# Patient Record
Sex: Female | Born: 1955
Health system: Southern US, Community
[De-identification: ages and names within clinical notes are randomized; demographics above are authoritative.]

## PROBLEM LIST (undated history)

## (undated) DIAGNOSIS — A419 Sepsis, unspecified organism: Secondary | ICD-10-CM

## (undated) DIAGNOSIS — K219 Gastro-esophageal reflux disease without esophagitis: Secondary | ICD-10-CM

## (undated) DIAGNOSIS — M2550 Pain in unspecified joint: Secondary | ICD-10-CM

## (undated) DIAGNOSIS — M109 Gout, unspecified: Secondary | ICD-10-CM

## (undated) DIAGNOSIS — Z8719 Personal history of other diseases of the digestive system: Secondary | ICD-10-CM

## (undated) DIAGNOSIS — Z8709 Personal history of other diseases of the respiratory system: Secondary | ICD-10-CM

## (undated) DIAGNOSIS — M2540 Effusion, unspecified joint: Secondary | ICD-10-CM

## (undated) DIAGNOSIS — Z8711 Personal history of peptic ulcer disease: Secondary | ICD-10-CM

## (undated) DIAGNOSIS — Z8669 Personal history of other diseases of the nervous system and sense organs: Secondary | ICD-10-CM

## (undated) DIAGNOSIS — J189 Pneumonia, unspecified organism: Secondary | ICD-10-CM

## (undated) DIAGNOSIS — M545 Low back pain, unspecified: Secondary | ICD-10-CM

## (undated) DIAGNOSIS — E039 Hypothyroidism, unspecified: Secondary | ICD-10-CM

## (undated) DIAGNOSIS — Z973 Presence of spectacles and contact lenses: Secondary | ICD-10-CM

## (undated) DIAGNOSIS — R350 Frequency of micturition: Secondary | ICD-10-CM

## (undated) DIAGNOSIS — Z86010 Personal history of colonic polyps: Secondary | ICD-10-CM

## (undated) DIAGNOSIS — R531 Weakness: Secondary | ICD-10-CM

## (undated) DIAGNOSIS — M1990 Unspecified osteoarthritis, unspecified site: Secondary | ICD-10-CM

## (undated) DIAGNOSIS — R3915 Urgency of urination: Secondary | ICD-10-CM

## (undated) HISTORY — DX: Unspecified osteoarthritis, unspecified site: M19.90

## (undated) HISTORY — PX: ESOPHAGOGASTRODUODENOSCOPY: SHX1529

## (undated) HISTORY — PX: OTHER SURGICAL HISTORY: SHX169

---

## 1993-06-15 HISTORY — PX: TUBAL LIGATION: SHX77

## 1997-10-15 ENCOUNTER — Emergency Department (HOSPITAL_COMMUNITY): Admission: EM | Admit: 1997-10-15 | Discharge: 1997-10-15 | Payer: Self-pay | Admitting: Emergency Medicine

## 1997-11-14 ENCOUNTER — Encounter: Admission: RE | Admit: 1997-11-14 | Discharge: 1998-02-12 | Payer: Self-pay | Admitting: Family Medicine

## 1998-03-15 ENCOUNTER — Ambulatory Visit (HOSPITAL_COMMUNITY): Admission: RE | Admit: 1998-03-15 | Discharge: 1998-03-15 | Payer: Self-pay | Admitting: Cardiology

## 1998-04-03 ENCOUNTER — Emergency Department (HOSPITAL_COMMUNITY): Admission: EM | Admit: 1998-04-03 | Discharge: 1998-04-03 | Payer: Self-pay | Admitting: Emergency Medicine

## 2002-12-04 ENCOUNTER — Ambulatory Visit (HOSPITAL_COMMUNITY): Admission: RE | Admit: 2002-12-04 | Discharge: 2002-12-04 | Payer: Self-pay | Admitting: Obstetrics

## 2002-12-04 ENCOUNTER — Encounter: Payer: Self-pay | Admitting: Obstetrics

## 2004-12-18 ENCOUNTER — Ambulatory Visit (HOSPITAL_COMMUNITY): Admission: RE | Admit: 2004-12-18 | Discharge: 2004-12-18 | Payer: Self-pay | Admitting: Obstetrics

## 2005-10-14 ENCOUNTER — Encounter: Payer: Self-pay | Admitting: Cardiology

## 2006-01-21 ENCOUNTER — Ambulatory Visit (HOSPITAL_COMMUNITY): Admission: RE | Admit: 2006-01-21 | Discharge: 2006-01-21 | Payer: Self-pay | Admitting: Obstetrics

## 2006-03-15 ENCOUNTER — Encounter: Admission: RE | Admit: 2006-03-15 | Discharge: 2006-03-15 | Payer: Self-pay | Admitting: Orthopedic Surgery

## 2007-11-18 ENCOUNTER — Ambulatory Visit (HOSPITAL_COMMUNITY): Admission: RE | Admit: 2007-11-18 | Discharge: 2007-11-18 | Payer: Self-pay | Admitting: Obstetrics

## 2008-04-03 ENCOUNTER — Encounter: Admission: RE | Admit: 2008-04-03 | Discharge: 2008-04-03 | Payer: Self-pay | Admitting: Family Medicine

## 2009-11-06 ENCOUNTER — Ambulatory Visit (HOSPITAL_COMMUNITY): Admission: RE | Admit: 2009-11-06 | Discharge: 2009-11-06 | Payer: Self-pay | Admitting: Obstetrics

## 2010-07-06 ENCOUNTER — Encounter: Payer: Self-pay | Admitting: Obstetrics

## 2010-08-22 ENCOUNTER — Other Ambulatory Visit: Payer: Self-pay | Admitting: Family Medicine

## 2010-08-22 DIAGNOSIS — R140 Abdominal distension (gaseous): Secondary | ICD-10-CM

## 2010-08-28 ENCOUNTER — Other Ambulatory Visit: Payer: Self-pay

## 2010-09-05 ENCOUNTER — Other Ambulatory Visit: Payer: Self-pay

## 2010-09-08 ENCOUNTER — Ambulatory Visit
Admission: RE | Admit: 2010-09-08 | Discharge: 2010-09-08 | Disposition: A | Discharge status: Home / Self Care | Payer: 59 | Source: Ambulatory Visit | Attending: Family Medicine | Admitting: Family Medicine

## 2010-09-08 DIAGNOSIS — R140 Abdominal distension (gaseous): Secondary | ICD-10-CM

## 2010-09-08 MED ORDER — IOHEXOL 300 MG/ML  SOLN
100.0000 mL | Freq: Once | INTRAMUSCULAR | Status: AC | PRN
  Administered 2010-09-08: 100 mL via INTRAVENOUS

## 2010-10-22 ENCOUNTER — Other Ambulatory Visit: Payer: Self-pay | Admitting: Family Medicine

## 2010-10-22 ENCOUNTER — Ambulatory Visit
Admission: RE | Admit: 2010-10-22 | Discharge: 2010-10-22 | Disposition: A | Discharge status: Home / Self Care | Payer: 59 | Source: Ambulatory Visit | Attending: Family Medicine | Admitting: Family Medicine

## 2010-10-22 DIAGNOSIS — I2699 Other pulmonary embolism without acute cor pulmonale: Secondary | ICD-10-CM

## 2010-10-22 MED ORDER — IOHEXOL 300 MG/ML  SOLN
125.0000 mL | Freq: Once | INTRAMUSCULAR | Status: AC | PRN
  Administered 2010-10-22: 125 mL via INTRAVENOUS

## 2010-10-23 ENCOUNTER — Other Ambulatory Visit (HOSPITAL_COMMUNITY): Payer: Self-pay | Admitting: Family Medicine

## 2010-10-23 DIAGNOSIS — E079 Disorder of thyroid, unspecified: Secondary | ICD-10-CM

## 2010-10-30 ENCOUNTER — Encounter (HOSPITAL_COMMUNITY)
Admission: RE | Admit: 2010-10-30 | Discharge: 2010-10-30 | Disposition: A | Discharge status: Home / Self Care | Payer: Managed Care, Other (non HMO) | Source: Ambulatory Visit | Attending: Family Medicine | Admitting: Family Medicine

## 2010-10-30 ENCOUNTER — Other Ambulatory Visit (HOSPITAL_COMMUNITY): Payer: Self-pay | Admitting: Family Medicine

## 2010-10-30 DIAGNOSIS — E079 Disorder of thyroid, unspecified: Secondary | ICD-10-CM

## 2010-10-30 DIAGNOSIS — E0590 Thyrotoxicosis, unspecified without thyrotoxic crisis or storm: Secondary | ICD-10-CM

## 2010-10-31 ENCOUNTER — Ambulatory Visit (HOSPITAL_COMMUNITY): Payer: 59

## 2010-12-01 ENCOUNTER — Ambulatory Visit (HOSPITAL_COMMUNITY): Payer: 59

## 2010-12-04 ENCOUNTER — Encounter (HOSPITAL_COMMUNITY)
Admission: RE | Admit: 2010-12-04 | Discharge: 2010-12-04 | Disposition: A | Discharge status: Home / Self Care | Payer: Managed Care, Other (non HMO) | Source: Ambulatory Visit | Attending: Family Medicine | Admitting: Family Medicine

## 2010-12-04 DIAGNOSIS — E0590 Thyrotoxicosis, unspecified without thyrotoxic crisis or storm: Secondary | ICD-10-CM | POA: Insufficient documentation

## 2010-12-05 ENCOUNTER — Encounter (HOSPITAL_COMMUNITY)
Admission: RE | Admit: 2010-12-05 | Discharge: 2010-12-05 | Disposition: A | Discharge status: Home / Self Care | Payer: Managed Care, Other (non HMO) | Source: Ambulatory Visit | Attending: Family Medicine | Admitting: Family Medicine

## 2010-12-05 MED ORDER — SODIUM PERTECHNETATE TC 99M INJECTION
10.0000 | Freq: Once | INTRAVENOUS | Status: AC | PRN
  Administered 2010-12-05: 10 via INTRAVENOUS

## 2010-12-05 MED ORDER — SODIUM IODIDE I 131 CAPSULE
13.1300 | Freq: Once | INTRAVENOUS | Status: AC | PRN
  Administered 2010-12-05: 13.13 via ORAL

## 2010-12-08 ENCOUNTER — Ambulatory Visit (INDEPENDENT_AMBULATORY_CARE_PROVIDER_SITE_OTHER): Payer: 59 | Admitting: Surgery

## 2010-12-08 ENCOUNTER — Encounter (INDEPENDENT_AMBULATORY_CARE_PROVIDER_SITE_OTHER): Payer: Self-pay | Admitting: Surgery

## 2010-12-08 VITALS — BP 108/70 | HR 80 | Temp 98.3°F | Ht 63.0 in | Wt 167.0 lb

## 2010-12-08 DIAGNOSIS — E041 Nontoxic single thyroid nodule: Secondary | ICD-10-CM

## 2010-12-08 NOTE — Progress Notes (Signed)
Subjective:     Patient ID: Sandy Sutton, female   DOB: 1955/06/29, 55 y.o.   MRN: [id number]     HPI Patient is a 55 year old black female referred by Dr. Parke Simmers for evaluation of a newly diagnosed right thyroid nodule. Patient had symptoms of chest discomfort and palpitations in early May 2012. She underwent a CT angiogram at [facility]. This was a normal study with the exception of a 3.3 cm heterogeneous mass in the right thyroid lobe. Patient subsequently underwent a nuclear medicine thyroid uptake scan which showed this to be a cold nodule. Dr. Parke Simmers also checked thyroid function tests which were within normal limits. Patient is now referred to my office for evaluation of right thyroid nodule.  Patient has no prior history of thyroid disease. She has never been on thyroid medication. She has had no prior head or neck surgery. There is no significant family history of thyroid or other endocrine disease.  Patient has noted a mild globus sensation with swallowing. She denies dysphagia. She denies pain. Patient is able to palpate the nodule in the right thyroid bed.  Review of Systems Patient notes a mild globus sensation. She denies pain. She denies dysphagia. She denies any voice changes.    Objective:   Physical Exam 55 year old mildly obese black female in no acute distress HEENT: Normocephalic. Sclerae clear. Dentition good. Voice is normal. Neck: Anterior examination of the neck with the neck extended shows it to be symmetric. There is a slight fullness in the right thyroid bed. On palpation there is a dominant mass occupying a large portion of the right thyroid lobe. This is nontender. It is discrete. It is mobile with swallowing. There is no associated lymphadenopathy. Isthmus and left thyroid lobe are normal to palpation. No supraclavicular masses. No anterior or posterior cervical lymphadenopathy. Chest: There to auscultation bilaterally without rales rhonchi or  wheeze Cardiac: Regular rate and rhythm without murmur Extremities: Nontender without edema. Neurologic: No focal neurologic deficits. No sign of tremor.    Assessment:     Right thyroid nodule, 3.3 cm, cold on nuclear medicine scanning.    Plan:     Patient will be scheduled for diagnostic thyroid ultrasound at [facility] [place] [facility]. Patient will then undergo ultrasound-guided fine-needle aspiration biopsy of the right thyroid nodule. I were reviewed that the cytopathology when it is available and contact the patient. She will return to see me in this office in approximately 4 weeks for review of cytopathology results and to make a decision regarding possible surgical intervention.

## 2010-12-09 ENCOUNTER — Other Ambulatory Visit (INDEPENDENT_AMBULATORY_CARE_PROVIDER_SITE_OTHER): Payer: Self-pay | Admitting: Surgery

## 2010-12-09 DIAGNOSIS — E041 Nontoxic single thyroid nodule: Secondary | ICD-10-CM

## 2010-12-10 ENCOUNTER — Other Ambulatory Visit (HOSPITAL_COMMUNITY)
Admission: RE | Admit: 2010-12-10 | Discharge: 2010-12-10 | Disposition: A | Discharge status: Home / Self Care | Payer: Managed Care, Other (non HMO) | Source: Ambulatory Visit | Attending: Interventional Radiology | Admitting: Interventional Radiology

## 2010-12-10 ENCOUNTER — Ambulatory Visit
Admission: RE | Admit: 2010-12-10 | Discharge: 2010-12-10 | Disposition: A | Discharge status: Home / Self Care | Payer: Self-pay | Source: Ambulatory Visit | Attending: Surgery | Admitting: Surgery

## 2010-12-10 ENCOUNTER — Inpatient Hospital Stay: Admission: RE | Admit: 2010-12-10 | Payer: Managed Care, Other (non HMO) | Source: Ambulatory Visit

## 2010-12-10 ENCOUNTER — Other Ambulatory Visit: Payer: Managed Care, Other (non HMO)

## 2010-12-10 ENCOUNTER — Other Ambulatory Visit: Payer: Self-pay | Admitting: Interventional Radiology

## 2010-12-10 DIAGNOSIS — E049 Nontoxic goiter, unspecified: Secondary | ICD-10-CM | POA: Insufficient documentation

## 2010-12-10 NOTE — Progress Notes (Signed)
Addended by: Velora Heckler on: 12/10/2010 09:07 AM   Modules accepted: Orders

## 2010-12-10 NOTE — Progress Notes (Signed)
Addended by: Haze Boyden on: 12/10/2010 09:25 AM   Modules accepted: Orders

## 2011-01-19 ENCOUNTER — Ambulatory Visit (INDEPENDENT_AMBULATORY_CARE_PROVIDER_SITE_OTHER): Payer: Managed Care, Other (non HMO) | Admitting: Surgery

## 2011-01-19 ENCOUNTER — Encounter (INDEPENDENT_AMBULATORY_CARE_PROVIDER_SITE_OTHER): Payer: Self-pay | Admitting: Surgery

## 2011-01-19 VITALS — BP 116/80 | HR 64 | Temp 97.2°F

## 2011-01-19 DIAGNOSIS — E041 Nontoxic single thyroid nodule: Secondary | ICD-10-CM

## 2011-01-19 NOTE — Progress Notes (Signed)
HISTORY: The patient is a 55 year old black female who returns for followup having undergone fine needle aspiration biopsy of dominant right thyroid nodule. Cytopathology results are consistent with nonneoplastic goiter.   PERTINENT REVIEW OF SYSTEMS: Patient continues to note a mild to moderate globus sensation. She does not have significant dysphagia. She is has had no voice changes.   EXAM: HEENT: normocephalic; pupils equal and reactive; sclerae clear; dentition good; mucous membranes moist NECK:  Left thyroid lobe is without palpable abnormality. Right thyroid lobe is replaced by a dominant 3 cm nodule. This is mildly tender. It is mobile with swallowing; symmetric on extension; no palpable anterior or posterior cervical lymphadenopathy; no supraclavicular masses; no tenderness CHEST: clear to auscultation bilaterally without rales, rhonchi, or wheezes CARDIAC: regular rate and rhythm without significant murmur; peripheral pulses are full EXT:  non-tender without edema; no deformity NEURO: no gross focal deficits; no sign of tremor   IMPRESSION: Right thyroid nodule, 3.3 cm, with benign cytopathology. Mild compressive symptoms. Normal thyroid function.   PLAN: The patient and I discussed the options for further management. At this point in time I do not think she is a good candidate for thyroid hormone suppression. We discussed surgery with the option of right thyroid lobectomy. At this point the patient wishes to avoid surgery. Therefore we will plan on continued close observation. Patient will return in 6 months. We will obtain a thyroid ultrasound and a TSH level prior to that office visit.   Velora Heckler, MD, FACS General & Endocrine Surgery [facility]

## 2011-07-22 ENCOUNTER — Other Ambulatory Visit: Payer: Managed Care, Other (non HMO)

## 2012-01-25 ENCOUNTER — Other Ambulatory Visit: Payer: Self-pay | Admitting: Otolaryngology

## 2012-01-25 DIAGNOSIS — J329 Chronic sinusitis, unspecified: Secondary | ICD-10-CM

## 2012-02-19 ENCOUNTER — Inpatient Hospital Stay: Admission: RE | Admit: 2012-02-19 | Payer: Managed Care, Other (non HMO) | Source: Ambulatory Visit

## 2012-02-19 ENCOUNTER — Ambulatory Visit
Admission: RE | Admit: 2012-02-19 | Discharge: 2012-02-19 | Disposition: A | Discharge status: Home / Self Care | Payer: 59 | Source: Ambulatory Visit | Attending: Otolaryngology | Admitting: Otolaryngology

## 2012-02-19 DIAGNOSIS — J329 Chronic sinusitis, unspecified: Secondary | ICD-10-CM

## 2012-07-13 ENCOUNTER — Telehealth (INDEPENDENT_AMBULATORY_CARE_PROVIDER_SITE_OTHER): Payer: Self-pay

## 2012-07-13 ENCOUNTER — Other Ambulatory Visit (INDEPENDENT_AMBULATORY_CARE_PROVIDER_SITE_OTHER): Payer: Self-pay

## 2012-07-13 DIAGNOSIS — E042 Nontoxic multinodular goiter: Secondary | ICD-10-CM

## 2012-07-13 NOTE — Telephone Encounter (Signed)
Pt due for U/S and TSH prior to appt with Dr Gerrit Friends. LMOM for pt to call. I have put orders in epic for U/S at [facility] and labs and [facility].

## 2012-07-19 ENCOUNTER — Ambulatory Visit (INDEPENDENT_AMBULATORY_CARE_PROVIDER_SITE_OTHER): Payer: Self-pay | Admitting: Surgery

## 2012-08-06 ENCOUNTER — Other Ambulatory Visit (INDEPENDENT_AMBULATORY_CARE_PROVIDER_SITE_OTHER): Payer: Self-pay | Admitting: Surgery

## 2012-08-06 LAB — TSH: TSH: 0.961 u[IU]/mL (ref 0.350–4.500)

## 2012-08-08 ENCOUNTER — Telehealth (INDEPENDENT_AMBULATORY_CARE_PROVIDER_SITE_OTHER): Payer: Self-pay

## 2012-08-08 NOTE — Telephone Encounter (Signed)
LMOM pt has appt and is due for u/s prior to appt. Order in epic. LMOM for pt to call [phone] to set up u/s before appt.

## 2012-08-10 ENCOUNTER — Ambulatory Visit
Admission: RE | Admit: 2012-08-10 | Discharge: 2012-08-10 | Disposition: A | Discharge status: Home / Self Care | Payer: BC Managed Care – PPO | Source: Ambulatory Visit | Attending: Surgery | Admitting: Surgery

## 2012-08-10 ENCOUNTER — Telehealth (INDEPENDENT_AMBULATORY_CARE_PROVIDER_SITE_OTHER): Payer: Self-pay

## 2012-08-10 DIAGNOSIS — E042 Nontoxic multinodular goiter: Secondary | ICD-10-CM

## 2012-08-10 NOTE — Telephone Encounter (Signed)
LMOM for pt that she needs thyroid u/s prior to her 08-15-12 ov. Pt can call [phone] to set up u/s.

## 2012-08-11 ENCOUNTER — Other Ambulatory Visit: Payer: 59

## 2012-08-15 ENCOUNTER — Encounter (HOSPITAL_COMMUNITY): Payer: Self-pay | Admitting: Pharmacy Technician

## 2012-08-15 ENCOUNTER — Encounter (INDEPENDENT_AMBULATORY_CARE_PROVIDER_SITE_OTHER): Payer: Self-pay | Admitting: Surgery

## 2012-08-15 ENCOUNTER — Ambulatory Visit (INDEPENDENT_AMBULATORY_CARE_PROVIDER_SITE_OTHER): Payer: BC Managed Care – PPO | Admitting: Surgery

## 2012-08-15 VITALS — BP 110/72 | HR 88 | Temp 97.3°F | Resp 16 | Ht 63.0 in | Wt 165.0 lb

## 2012-08-15 DIAGNOSIS — E041 Nontoxic single thyroid nodule: Secondary | ICD-10-CM

## 2012-08-15 NOTE — Patient Instructions (Signed)

## 2012-08-15 NOTE — Progress Notes (Signed)
General Surgery [facility]  Chief Complaint  Patient presents with  . Thyroid Nodule    long term follow-up    HISTORY: Patient is a 57 year old black female last seen in August 2012 for a dominant right thyroid nodule. In the interim she has developed progressive compressive symptoms including dysphagia, globus sensation, and voice changes. She also notes discomfort with changes in position. She would like to discuss surgical removal.  At my request the patient underwent a followup thyroid ultrasound which demonstrates a dominant nodule in the right inferior thyroid lobe measuring 3.7 cm. This shows a slight interval increase. Left thyroid lobe contained subcentimeter nodules. TSH level is normal at 0.95.  Patient has had no prior head or neck surgery. She has never been on thyroid medication. Previous fine-needle aspiration biopsy of the dominant nodule was benign.  Past Medical History  Diagnosis Date  . Arthritis   . Headache      No current outpatient prescriptions on file.   No current facility-administered medications for this visit.     Allergies  Allergen Reactions  . Darvon Other (See Comments)    Hallucinations  . Vicodin (Hydrocodone-Acetaminophen) Nausea And Vomiting  . Erythromycin Nausea And Vomiting and Rash     Family History  Problem Relation Age of Onset  . Cancer Mother   . Alzheimer's disease Father   . Hypertension Sister   . Breast cancer Sister   . Lupus Sister   . Cancer Brother      History   Social History  . Marital Status: Divorced    Spouse Name: N/A    Number of Children: N/A  . Years of Education: N/A   Social History Main Topics  . Smoking status: Never Smoker   . Smokeless tobacco: None  . Alcohol Use: Yes     Comment: rare  . Drug Use: No  . Sexually Active: None   Other Topics Concern  . None   Social History Narrative  . None     REVIEW OF SYSTEMS - PERTINENT POSITIVES ONLY: Progressive  compressive symptoms including dysphagia, shortness of breath, voice changes, and globus sensation.  EXAM: Filed Vitals:   08/15/12 1146  BP: 110/72  Pulse: 88  Temp: 97.3 F (36.3 C)  Resp: 16    HEENT: normocephalic; pupils equal and reactive; sclerae clear; dentition good; mucous membranes moist NECK:  Palpable dominant mass occupying a large part of the right thyroid lobe measuring approximately 4 cm in size and extending beneath the right clavicular head; slight tracheal deviation to the left; asymmetric on extension; no palpable anterior or posterior cervical lymphadenopathy; no supraclavicular masses; no tenderness CHEST: clear to auscultation bilaterally without rales, rhonchi, or wheezes CARDIAC: regular rate and rhythm without significant murmur; peripheral pulses are full EXT:  non-tender without edema; no deformity NEURO: no gross focal deficits; no sign of tremor   LABORATORY RESULTS: See [facility] (CHL-Epic) for most recent results   RADIOLOGY RESULTS: See [facility] (CHL-Epic) for most recent results   IMPRESSION: Dominant right thyroid nodule, 3.7 cm, with mild to moderate compressive symptoms  PLAN: The patient and I again discussed the significance of this finding. I provided her with written literature to review. We discussed options for management been continued observation, right thyroid lobectomy, or total thyroidectomy. After discussion of the risk and benefits, the patient would like to proceed with right thyroid lobectomy. We discussed the potential for injury to recurrent laryngeal nerve and injury to parathyroid  glands. We discussed the hospital stay to be anticipated. We discussed her postoperative recovery. We discussed the potential need for thyroid hormone replacement. We discussed the potential need for further thyroid surgery in the future. She understands and wishes to proceed. We will make arrangements for her procedure in the near  future.  The risks and benefits of the procedure have been discussed at length with the patient.  The patient understands the proposed procedure, potential alternative treatments, and the course of recovery to be expected.  All of the patient's questions have been answered at this time.  The patient wishes to proceed with surgery.  Velora Heckler, MD, FACS General & Endocrine Surgery [facility]   Visit Diagnoses: 1. Thyroid nodule, cold     Primary Care Physician: Geraldo Pitter, MD

## 2012-08-16 NOTE — Patient Instructions (Signed)
20 Sandy Sutton  08/16/2012   Your procedure is scheduled on: 3-7  -2014  Report to [facility] at    1000    AM   Call this number if you have problems the morning of surgery: [phone]  Or Presurgical Testing [phone](Sandy Sutton)   Remember: Follow any bowel prep instructions per MD office. For Cpap use: Bring mask and tubing only.   Do not eat food:After Midnight.  May have clear liquids:up to 6 Hours before arrival. Nothing after :  Clear liquids include soda, tea, black coffee, apple or grape juice, broth.  Take these medicines the morning of surgery with A SIP OF WATER:    Do not wear jewelry, make-up or nail polish.  Do not wear lotions, powders, or perfumes. You may wear deodorant.  Do not shave 12 hours prior to first CHG shower(legs and under arms).(face and neck okay.)  Do not bring valuables to the hospital.  Contacts, dentures or bridgework,body piercing,  may not be worn into surgery.  Leave suitcase in the car. After surgery it may be brought to your room.  For patients admitted to the hospital, checkout time is 11:00 AM the day of discharge.   Patients discharged the day of surgery will not be allowed to drive home. Must have responsible person with you x 24 hours once discharged.  Name and phone number of your driver:   Special Instructions: CHG(Chlorhedine 4%-"Hibiclens","Betasept","Aplicare") Shower Use Special Wash: see special instructions.(avoid face and genitals)   Please read over the following fact sheets that you were given: MRSA Information, Blood Transfusion fact sheet, Incentive Spirometry Instruction.    Failure to follow these instructions may result in Cancellation of your surgery.   Patient signature_______________________________________________________

## 2012-08-17 ENCOUNTER — Encounter (HOSPITAL_COMMUNITY): Payer: Self-pay

## 2012-08-17 ENCOUNTER — Telehealth (INDEPENDENT_AMBULATORY_CARE_PROVIDER_SITE_OTHER): Payer: Self-pay

## 2012-08-17 ENCOUNTER — Encounter (HOSPITAL_COMMUNITY)
Admission: RE | Admit: 2012-08-17 | Discharge: 2012-08-17 | Disposition: A | Discharge status: Home / Self Care | Payer: BC Managed Care – PPO | Source: Ambulatory Visit | Attending: Surgery | Admitting: Surgery

## 2012-08-17 ENCOUNTER — Ambulatory Visit (HOSPITAL_COMMUNITY)
Admission: RE | Admit: 2012-08-17 | Discharge: 2012-08-17 | Disposition: A | Discharge status: Home / Self Care | Payer: BC Managed Care – PPO | Source: Ambulatory Visit | Attending: Surgery | Admitting: Surgery

## 2012-08-17 DIAGNOSIS — E041 Nontoxic single thyroid nodule: Secondary | ICD-10-CM | POA: Insufficient documentation

## 2012-08-17 DIAGNOSIS — Z01818 Encounter for other preprocedural examination: Secondary | ICD-10-CM | POA: Insufficient documentation

## 2012-08-17 DIAGNOSIS — Z01812 Encounter for preprocedural laboratory examination: Secondary | ICD-10-CM | POA: Insufficient documentation

## 2012-08-17 LAB — CBC
HCT: 35.7 % — ABNORMAL LOW (ref 36.0–46.0)
Hemoglobin: 11.6 g/dL — ABNORMAL LOW (ref 12.0–15.0)
MCH: 29.4 pg (ref 26.0–34.0)
MCHC: 32.5 g/dL (ref 30.0–36.0)
MCV: 90.4 fL (ref 78.0–100.0)
Platelets: 331 10*3/uL (ref 150–400)
RBC: 3.95 MIL/uL (ref 3.87–5.11)
RDW: 12.4 % (ref 11.5–15.5)
WBC: 9.8 10*3/uL (ref 4.0–10.5)

## 2012-08-17 LAB — BASIC METABOLIC PANEL
BUN: 15 mg/dL (ref 6–23)
CO2: 28 mEq/L (ref 19–32)
Calcium: 9.4 mg/dL (ref 8.4–10.5)
Chloride: 106 mEq/L (ref 96–112)
Creatinine, Ser: 0.85 mg/dL (ref 0.50–1.10)
GFR calc Af Amer: 86 mL/min — ABNORMAL LOW (ref >90)
GFR calc non Af Amer: 74 mL/min — ABNORMAL LOW (ref >90)
Glucose, Bld: 111 mg/dL — ABNORMAL HIGH (ref 70–99)
Potassium: 4.3 mEq/L (ref 3.5–5.1)
Sodium: 141 mEq/L (ref 135–145)

## 2012-08-17 LAB — SURGICAL PCR SCREEN
MRSA, PCR: NEGATIVE
Staphylococcus aureus: NEGATIVE

## 2012-08-17 NOTE — Progress Notes (Addendum)
CXR done today results on the chart in Epic

## 2012-08-17 NOTE — Telephone Encounter (Signed)
LMOM PO appt date.

## 2012-08-17 NOTE — Pre-Procedure Instructions (Addendum)
20 Sandy Sutton  08/17/2012   Your procedure is scheduled on: Friday, August 19, 2012   Report to [facility] at  AM.1000  Call this number if you have problems the morning of surgery: [phone]   Remember:   Do not drink liquids or  eat food:After Midnight.Thursday night 03-06, 2014      Take these medicines the morning of surgery with A SIP OF WATER: None                          SEE [facility] PREPARING FOR SURGERY SHEET    Do not wear jewelry, make-up or nail polish.  Do not wear lotions, powders, or perfumes. You may wear deodorant.             Men may shave face and neck.  Do not bring valuables to the hospital.  Contacts, dentures or bridgework may not be worn into surgery.  Leave suitcase in the car. After surgery it may be brought to your room.  For patients admitted to the hospital, checkout time is 11:00 AM the day of                         discharge.   Patients discharged the day of surgery will not be allowed to drive home.  Name and phone number of your driver: daughter Sandy Sutton 336 [phone]     Please read over the following fact sheets that you were given:MRSA Information                    Call Jolyn Nap, RN pre op nurse if needed [phone]               FAILURE TO FOLLOW THESE INSTRUCTIONS MAY RESULT IN THE CANCELLATION OF YOUR SURGERY. PATIENT SIGNATURE___________________________________________________

## 2012-08-18 NOTE — Progress Notes (Signed)
Quick Note:  These results are acceptable for scheduled surgery.  Todd M. Gerkin, MD, FACS [facility] Office: [phone]   ______ 

## 2012-08-19 ENCOUNTER — Observation Stay (HOSPITAL_COMMUNITY)
Admission: RE | Admit: 2012-08-19 | Discharge: 2012-08-21 | Disposition: A | Discharge status: Home / Self Care | Payer: BC Managed Care – PPO | Source: Ambulatory Visit | Attending: Surgery | Admitting: Surgery

## 2012-08-19 ENCOUNTER — Encounter (HOSPITAL_COMMUNITY)
Admission: RE | Disposition: A | Discharge status: Home / Self Care | Payer: Self-pay | Source: Ambulatory Visit | Attending: Surgery

## 2012-08-19 ENCOUNTER — Encounter (HOSPITAL_COMMUNITY): Payer: Self-pay | Admitting: *Deleted

## 2012-08-19 ENCOUNTER — Ambulatory Visit (HOSPITAL_COMMUNITY): Payer: BC Managed Care – PPO | Admitting: Anesthesiology

## 2012-08-19 ENCOUNTER — Encounter (HOSPITAL_COMMUNITY): Payer: Self-pay | Admitting: Anesthesiology

## 2012-08-19 DIAGNOSIS — E041 Nontoxic single thyroid nodule: Secondary | ICD-10-CM | POA: Diagnosis present

## 2012-08-19 DIAGNOSIS — E042 Nontoxic multinodular goiter: Principal | ICD-10-CM | POA: Insufficient documentation

## 2012-08-19 HISTORY — PX: THYROID LOBECTOMY: SHX420

## 2012-08-19 SURGERY — LOBECTOMY, THYROID
Anesthesia: General | Laterality: Right | Wound class: Clean

## 2012-08-19 MED ORDER — HEMOSTATIC AGENTS (NO CHARGE) OPTIME
TOPICAL | Status: DC | PRN
  Administered 2012-08-19: 1 via TOPICAL

## 2012-08-19 MED ORDER — ACETAMINOPHEN 325 MG PO TABS: 650.0000 mg | ORAL_TABLET | ORAL | Status: DC | PRN

## 2012-08-19 MED ORDER — CEFAZOLIN SODIUM-DEXTROSE 2-3 GM-% IV SOLR
2.0000 g | INTRAVENOUS | Status: AC
  Administered 2012-08-19: 2 g via INTRAVENOUS

## 2012-08-19 MED ORDER — EPHEDRINE SULFATE 50 MG/ML IJ SOLN
INTRAMUSCULAR | Status: DC | PRN
  Administered 2012-08-19: 5 mg via INTRAVENOUS

## 2012-08-19 MED ORDER — LACTATED RINGERS IV SOLN
INTRAVENOUS | Status: DC
  Administered 2012-08-19 (×2): via INTRAVENOUS

## 2012-08-19 MED ORDER — ONDANSETRON HCL 4 MG/2ML IJ SOLN
INTRAMUSCULAR | Status: DC | PRN
  Administered 2012-08-19: 4 mg via INTRAVENOUS

## 2012-08-19 MED ORDER — TRAMADOL HCL 50 MG PO TABS
50.0000 mg | ORAL_TABLET | Freq: Four times a day (QID) | ORAL | Status: DC | PRN
  Administered 2012-08-20: 50 mg via ORAL
  Filled 2012-08-19: qty 1

## 2012-08-19 MED ORDER — ACETAMINOPHEN 10 MG/ML IV SOLN
INTRAVENOUS | Status: DC | PRN
  Administered 2012-08-19: 1000 mg via INTRAVENOUS

## 2012-08-19 MED ORDER — ACETAMINOPHEN 10 MG/ML IV SOLN
INTRAVENOUS | Status: AC
  Filled 2012-08-19: qty 100

## 2012-08-19 MED ORDER — KCL IN DEXTROSE-NACL 20-5-0.45 MEQ/L-%-% IV SOLN
INTRAVENOUS | Status: DC
  Administered 2012-08-19: 16:00:00 via INTRAVENOUS
  Filled 2012-08-19 (×3): qty 1000

## 2012-08-19 MED ORDER — HYDROMORPHONE HCL PF 1 MG/ML IJ SOLN
0.2500 mg | INTRAMUSCULAR | Status: DC | PRN
  Administered 2012-08-19 (×4): 0.5 mg via INTRAVENOUS

## 2012-08-19 MED ORDER — HYDROMORPHONE HCL PF 1 MG/ML IJ SOLN
INTRAMUSCULAR | Status: AC
  Filled 2012-08-19: qty 1

## 2012-08-19 MED ORDER — LACTATED RINGERS IV SOLN: INTRAVENOUS | Status: DC

## 2012-08-19 MED ORDER — ROCURONIUM BROMIDE 100 MG/10ML IV SOLN
INTRAVENOUS | Status: DC | PRN
  Administered 2012-08-19: 40 mg via INTRAVENOUS
  Administered 2012-08-19: 10 mg via INTRAVENOUS

## 2012-08-19 MED ORDER — PROMETHAZINE HCL 25 MG/ML IJ SOLN
12.5000 mg | INTRAMUSCULAR | Status: DC | PRN
  Administered 2012-08-19: 12.5 mg via INTRAVENOUS
  Filled 2012-08-19: qty 1

## 2012-08-19 MED ORDER — 0.9 % SODIUM CHLORIDE (POUR BTL) OPTIME
TOPICAL | Status: DC | PRN
  Administered 2012-08-19: 1000 mL

## 2012-08-19 MED ORDER — GLYCOPYRROLATE 0.2 MG/ML IJ SOLN
INTRAMUSCULAR | Status: DC | PRN
  Administered 2012-08-19: .5 mg via INTRAVENOUS

## 2012-08-19 MED ORDER — MIDAZOLAM HCL 5 MG/5ML IJ SOLN
INTRAMUSCULAR | Status: DC | PRN
  Administered 2012-08-19: 2 mg via INTRAVENOUS

## 2012-08-19 MED ORDER — CEFAZOLIN SODIUM-DEXTROSE 2-3 GM-% IV SOLR
INTRAVENOUS | Status: AC
  Filled 2012-08-19: qty 50

## 2012-08-19 MED ORDER — HYDROMORPHONE HCL PF 1 MG/ML IJ SOLN
1.0000 mg | INTRAMUSCULAR | Status: DC | PRN
  Administered 2012-08-19 – 2012-08-20 (×5): 1 mg via INTRAVENOUS
  Filled 2012-08-19 (×5): qty 1

## 2012-08-19 MED ORDER — LIDOCAINE HCL (PF) 2 % IJ SOLN
INTRAMUSCULAR | Status: DC | PRN
  Administered 2012-08-19: 75 mg

## 2012-08-19 MED ORDER — DEXAMETHASONE SODIUM PHOSPHATE 10 MG/ML IJ SOLN
INTRAMUSCULAR | Status: DC | PRN
  Administered 2012-08-19: 10 mg via INTRAVENOUS

## 2012-08-19 MED ORDER — PROPOFOL 10 MG/ML IV BOLUS
INTRAVENOUS | Status: DC | PRN
  Administered 2012-08-19: 130 mg via INTRAVENOUS

## 2012-08-19 MED ORDER — FENTANYL CITRATE 0.05 MG/ML IJ SOLN
INTRAMUSCULAR | Status: DC | PRN
  Administered 2012-08-19: 50 ug via INTRAVENOUS
  Administered 2012-08-19 (×2): 100 ug via INTRAVENOUS
  Administered 2012-08-19 (×2): 50 ug via INTRAVENOUS

## 2012-08-19 MED ORDER — NEOSTIGMINE METHYLSULFATE 1 MG/ML IJ SOLN
INTRAMUSCULAR | Status: DC | PRN
  Administered 2012-08-19: 3.5 mg via INTRAVENOUS

## 2012-08-19 SURGICAL SUPPLY — 38 items
ATTRACTOMAT 16X20 MAGNETIC DRP (DRAPES) ×2 IMPLANT
BENZOIN TINCTURE PRP APPL 2/3 (GAUZE/BANDAGES/DRESSINGS) ×2 IMPLANT
BLADE HEX COATED 2.75 (ELECTRODE) ×2 IMPLANT
BLADE SURG 15 STRL LF DISP TIS (BLADE) ×1 IMPLANT
BLADE SURG 15 STRL SS (BLADE) ×1
CANISTER SUCTION 2500CC (MISCELLANEOUS) ×2 IMPLANT
CHLORAPREP W/TINT 10.5 ML (MISCELLANEOUS) ×2 IMPLANT
CLIP TI MEDIUM 6 (CLIP) ×4 IMPLANT
CLIP TI WIDE RED SMALL 6 (CLIP) ×6 IMPLANT
CLOTH BEACON ORANGE TIMEOUT ST (SAFETY) ×2 IMPLANT
DISSECTOR ROUND CHERRY 3/8 STR (MISCELLANEOUS) IMPLANT
DRAPE PED LAPAROTOMY (DRAPES) ×2 IMPLANT
DRESSING SURGICEL FIBRLLR 1X2 (HEMOSTASIS) ×1 IMPLANT
DRSG SURGICEL FIBRILLAR 1X2 (HEMOSTASIS) ×2
ELECT REM PT RETURN 9FT ADLT (ELECTROSURGICAL) ×2
ELECTRODE REM PT RTRN 9FT ADLT (ELECTROSURGICAL) ×1 IMPLANT
GAUZE SPONGE 4X4 16PLY XRAY LF (GAUZE/BANDAGES/DRESSINGS) ×2 IMPLANT
GLOVE SURG ORTHO 8.0 STRL STRW (GLOVE) ×2 IMPLANT
GOWN STRL NON-REIN LRG LVL3 (GOWN DISPOSABLE) ×2 IMPLANT
GOWN STRL REIN XL XLG (GOWN DISPOSABLE) ×4 IMPLANT
KIT BASIN OR (CUSTOM PROCEDURE TRAY) ×2 IMPLANT
NS IRRIG 1000ML POUR BTL (IV SOLUTION) ×2 IMPLANT
PACK BASIC VI WITH GOWN DISP (CUSTOM PROCEDURE TRAY) ×2 IMPLANT
PENCIL BUTTON HOLSTER BLD 10FT (ELECTRODE) ×2 IMPLANT
SHEARS HARMONIC 9CM CVD (BLADE) ×2 IMPLANT
SPONGE GAUZE 4X4 12PLY (GAUZE/BANDAGES/DRESSINGS) ×2 IMPLANT
STAPLER VISISTAT 35W (STAPLE) IMPLANT
STRIP CLOSURE SKIN 1/2X4 (GAUZE/BANDAGES/DRESSINGS) ×2 IMPLANT
SUT MNCRL AB 4-0 PS2 18 (SUTURE) ×4 IMPLANT
SUT SILK 2 0 (SUTURE)
SUT SILK 2-0 18XBRD TIE 12 (SUTURE) IMPLANT
SUT SILK 3 0 (SUTURE)
SUT SILK 3-0 18XBRD TIE 12 (SUTURE) IMPLANT
SUT VIC AB 3-0 SH 18 (SUTURE) ×4 IMPLANT
SYR BULB IRRIGATION 50ML (SYRINGE) ×2 IMPLANT
TAPE CLOTH SURG 4X10 WHT LF (GAUZE/BANDAGES/DRESSINGS) ×2 IMPLANT
TOWEL OR 17X26 10 PK STRL BLUE (TOWEL DISPOSABLE) ×2 IMPLANT
YANKAUER SUCT BULB TIP 10FT TU (MISCELLANEOUS) ×2 IMPLANT

## 2012-08-19 NOTE — Anesthesia Preprocedure Evaluation (Addendum)
Anesthesia Evaluation  Patient identified by MRN, date of birth, ID band Patient awake    Reviewed: Allergy & Precautions, H&P , NPO status , Patient's Chart, lab work & pertinent test results  Airway Mallampati: II TM Distance: >3 FB Neck ROM: full    Dental  (+) Missing and Dental Advisory Given Missing right side teeth but front ones are OK:   Pulmonary neg pulmonary ROS,  breath sounds clear to auscultation  Pulmonary exam normal       Cardiovascular Exercise Tolerance: Good negative cardio ROS  Rhythm:regular Rate:Normal     Neuro/Psych negative neurological ROS  negative psych ROS   GI/Hepatic negative GI ROS, Neg liver ROS,   Endo/Other  negative endocrine ROS  Renal/GU negative Renal ROS  negative genitourinary   Musculoskeletal   Abdominal   Peds  Hematology negative hematology ROS (+)   Anesthesia Other Findings   Reproductive/Obstetrics negative OB ROS                          Anesthesia Physical Anesthesia Plan  ASA: I  Anesthesia Plan: General   Post-op Pain Management:    Induction: Intravenous  Airway Management Planned: Oral ETT  Additional Equipment:   Intra-op Plan:   Post-operative Plan: Extubation in OR  Informed Consent: I have reviewed the patients History and Physical, chart, labs and discussed the procedure including the risks, benefits and alternatives for the proposed anesthesia with the patient or authorized representative who has indicated his/her understanding and acceptance.   Dental Advisory Given  Plan Discussed with: CRNA and Surgeon  Anesthesia Plan Comments:         Anesthesia Quick Evaluation

## 2012-08-19 NOTE — Transfer of Care (Signed)
Immediate Anesthesia Transfer of Care Note  Patient: Sandy Sutton  Procedure(s) Performed: Procedure(s): THYROID LOBECTOMY (Right)  Patient Location: PACU  Anesthesia Type:General  Level of Consciousness: awake, sedated and patient cooperative  Airway & Oxygen Therapy: Patient Spontanous Breathing and Patient connected to face mask oxygen  Post-op Assessment: Report given to PACU RN and Post -op Vital signs reviewed and stable  Post vital signs: Reviewed and stable  Complications: No apparent anesthesia complications

## 2012-08-19 NOTE — H&P (View-Only) (Signed)
General Surgery - [facility]  Chief Complaint  Patient presents with  . Thyroid Nodule    long term follow-up    HISTORY: Patient is a 57-year-old black female last seen in August 2012 for a dominant right thyroid nodule. In the interim she has developed progressive compressive symptoms including dysphagia, globus sensation, and voice changes. She also notes discomfort with changes in position. She would like to discuss surgical removal.  At my request the patient underwent a followup thyroid ultrasound which demonstrates a dominant nodule in the right inferior thyroid lobe measuring 3.7 cm. This shows a slight interval increase. Left thyroid lobe contained subcentimeter nodules. TSH level is normal at 0.95.  Patient has had no prior head or neck surgery. She has never been on thyroid medication. Previous fine-needle aspiration biopsy of the dominant nodule was benign.  Past Medical History  Diagnosis Date  . Arthritis   . Headache      No current outpatient prescriptions on file.   No current facility-administered medications for this visit.     Allergies  Allergen Reactions  . Darvon Other (See Comments)    Hallucinations  . Vicodin (Hydrocodone-Acetaminophen) Nausea And Vomiting  . Erythromycin Nausea And Vomiting and Rash     Family History  Problem Relation Age of Onset  . Cancer Mother   . Alzheimer's disease Father   . Hypertension Sister   . Breast cancer Sister   . Lupus Sister   . Cancer Brother      History   Social History  . Marital Status: Divorced    Spouse Name: N/A    Number of Children: N/A  . Years of Education: N/A   Social History Main Topics  . Smoking status: Never Smoker   . Smokeless tobacco: None  . Alcohol Use: Yes     Comment: rare  . Drug Use: No  . Sexually Active: None   Other Topics Concern  . None   Social History Narrative  . None     REVIEW OF SYSTEMS - PERTINENT POSITIVES ONLY: Progressive  compressive symptoms including dysphagia, shortness of breath, voice changes, and globus sensation.  EXAM: Filed Vitals:   08/15/12 1146  BP: 110/72  Pulse: 88  Temp: 97.3 F (36.3 C)  Resp: 16    HEENT: normocephalic; pupils equal and reactive; sclerae clear; dentition good; mucous membranes moist NECK:  Palpable dominant mass occupying a large part of the right thyroid lobe measuring approximately 4 cm in size and extending beneath the right clavicular head; slight tracheal deviation to the left; asymmetric on extension; no palpable anterior or posterior cervical lymphadenopathy; no supraclavicular masses; no tenderness CHEST: clear to auscultation bilaterally without rales, rhonchi, or wheezes CARDIAC: regular rate and rhythm without significant murmur; peripheral pulses are full EXT:  non-tender without edema; no deformity NEURO: no gross focal deficits; no sign of tremor   LABORATORY RESULTS: See [facility] (CHL-Epic) for most recent results   RADIOLOGY RESULTS: See [facility] (CHL-Epic) for most recent results   IMPRESSION: Dominant right thyroid nodule, 3.7 cm, with mild to moderate compressive symptoms  PLAN: The patient and I again discussed the significance of this finding. I provided her with written literature to review. We discussed options for management been continued observation, right thyroid lobectomy, or total thyroidectomy. After discussion of the risk and benefits, the patient would like to proceed with right thyroid lobectomy. We discussed the potential for injury to recurrent laryngeal nerve and injury to parathyroid   glands. We discussed the hospital stay to be anticipated. We discussed her postoperative recovery. We discussed the potential need for thyroid hormone replacement. We discussed the potential need for further thyroid surgery in the future. She understands and wishes to proceed. We will make arrangements for her procedure in the near  future.  The risks and benefits of the procedure have been discussed at length with the patient.  The patient understands the proposed procedure, potential alternative treatments, and the course of recovery to be expected.  All of the patient's questions have been answered at this time.  The patient wishes to proceed with surgery.  Todd M. Gerkin, MD, FACS General & Endocrine Surgery [facility]   Visit Diagnoses: 1. Thyroid nodule, cold     Primary Care Physician: BLAND,VEITA J, MD   

## 2012-08-19 NOTE — Anesthesia Postprocedure Evaluation (Signed)
  Anesthesia Post-op Note  Patient: Sandy Sutton  Procedure(s) Performed: Procedure(s) (LRB): THYROID LOBECTOMY (Right)  Patient Location: PACU  Anesthesia Type: General  Level of Consciousness: awake and alert   Airway and Oxygen Therapy: Patient Spontanous Breathing  Post-op Pain: mild  Post-op Assessment: Post-op Vital signs reviewed, Patient's Cardiovascular Status Stable, Respiratory Function Stable, Patent Airway and No signs of Nausea or vomiting  Last Vitals:  Filed Vitals:   08/19/12 1415  BP: 112/60  Pulse: 67  Temp:   Resp: 12    Post-op Vital Signs: stable   Complications: No apparent anesthesia complications

## 2012-08-19 NOTE — Interval H&P Note (Signed)
History and Physical Interval Note:  08/19/2012 11:16 AM  Sandy Sutton  has presented today for surgery, with the diagnosis of right thyroid nodule.  The various methods of treatment have been discussed with the patient and family. After consideration of risks, benefits and other options for treatment, the patient has consented to    Procedure(s): THYROID LOBECTOMY (Right) as a surgical intervention .    The patient's history has been reviewed, patient examined, no change in status, stable for surgery.  I have reviewed the patient's chart and labs.  Questions were answered to the patient's satisfaction.    Velora Heckler, MD, FACS General & Endocrine Surgery [facility] Office: [phone]   GERKIN,TODD Judie Petit

## 2012-08-19 NOTE — Brief Op Note (Signed)
08/19/2012  1:43 PM  PATIENT:  Latina Craver  57 y.o. female  PRE-OPERATIVE DIAGNOSIS:  right thyroid nodule, cold, with compressive symptoms  POST-OPERATIVE DIAGNOSIS:  same  PROCEDURE:  Procedure(s): THYROID LOBECTOMY (Right)  SURGEON:  Surgeon(s) and Role:    * Velora Heckler, MD - Primary  ANESTHESIA:   general  EBL:  Total I/O In: 1000 [I.V.:1000] Out: -   BLOOD ADMINISTERED:none  DRAINS: none   LOCAL MEDICATIONS USED:  NONE  SPECIMEN:  Excision  DISPOSITION OF SPECIMEN:  PATHOLOGY  COUNTS:  YES  TOURNIQUET:  * No tourniquets in log *  DICTATION: .Other Dictation: Dictation Number [id number]  PLAN OF CARE: Admit for overnight observation  PATIENT DISPOSITION:  PACU - hemodynamically stable.   Delay start of Pharmacological VTE agent (>24hrs) due to surgical blood loss or risk of bleeding: yes  Velora Heckler, MD, FACS General & Endocrine Surgery [facility] Office: [phone]

## 2012-08-20 LAB — BASIC METABOLIC PANEL
BUN: 11 mg/dL (ref 6–23)
CO2: 26 mEq/L (ref 19–32)
Calcium: 9 mg/dL (ref 8.4–10.5)
Chloride: 103 mEq/L (ref 96–112)
Creatinine, Ser: 0.79 mg/dL (ref 0.50–1.10)
GFR calc Af Amer: >90 mL/min (ref >90)
GFR calc non Af Amer: >90 mL/min (ref >90)
Glucose, Bld: 142 mg/dL — ABNORMAL HIGH (ref 70–99)
Potassium: 4.4 mEq/L (ref 3.5–5.1)
Sodium: 137 mEq/L (ref 135–145)

## 2012-08-20 MED ORDER — PROMETHAZINE HCL 25 MG RE SUPP: 25.0000 mg | Freq: Four times a day (QID) | RECTAL | Status: DC | PRN

## 2012-08-20 MED ORDER — OXYCODONE HCL 5 MG PO TABS
5.0000 mg | ORAL_TABLET | ORAL | Status: DC | PRN
Start: 2012-08-20 — End: 2012-09-19

## 2012-08-20 MED ORDER — SODIUM CHLORIDE 0.9 % IJ SOLN: 3.0000 mL | INTRAMUSCULAR | Status: DC | PRN

## 2012-08-20 MED ORDER — OXYCODONE HCL 5 MG PO TABS
5.0000 mg | ORAL_TABLET | ORAL | Status: DC | PRN
  Administered 2012-08-20: 10 mg via ORAL
  Administered 2012-08-20 (×2): 5 mg via ORAL
  Administered 2012-08-21 (×2): 10 mg via ORAL
  Filled 2012-08-20 (×3): qty 2
  Filled 2012-08-20 (×2): qty 1

## 2012-08-20 MED ORDER — MAGIC MOUTHWASH
15.0000 mL | Freq: Four times a day (QID) | ORAL | Status: DC | PRN
  Filled 2012-08-20: qty 15

## 2012-08-20 MED ORDER — FENTANYL CITRATE 0.05 MG/ML IJ SOLN: 25.0000 ug | INTRAMUSCULAR | Status: DC | PRN

## 2012-08-20 MED ORDER — PROMETHAZINE HCL 25 MG/ML IJ SOLN: 12.5000 mg | Freq: Four times a day (QID) | INTRAMUSCULAR | Status: DC | PRN

## 2012-08-20 MED ORDER — MAGNESIUM HYDROXIDE 400 MG/5ML PO SUSP: 30.0000 mL | Freq: Two times a day (BID) | ORAL | Status: DC | PRN

## 2012-08-20 MED ORDER — SODIUM CHLORIDE 0.9 % IJ SOLN: 3.0000 mL | Freq: Two times a day (BID) | INTRAMUSCULAR | Status: DC

## 2012-08-20 MED ORDER — LIP MEDEX EX OINT
1.0000 "application " | TOPICAL_OINTMENT | Freq: Two times a day (BID) | CUTANEOUS | Status: DC
  Administered 2012-08-20: 1 via TOPICAL

## 2012-08-20 MED ORDER — HYDROMORPHONE HCL 2 MG PO TABS: 2.0000 mg | ORAL_TABLET | ORAL | Status: DC | PRN

## 2012-08-20 MED ORDER — PROMETHAZINE HCL 12.5 MG PO TABS: 12.5000 mg | ORAL_TABLET | Freq: Four times a day (QID) | ORAL | Status: DC | PRN

## 2012-08-20 MED ORDER — ALUM & MAG HYDROXIDE-SIMETH 200-200-20 MG/5ML PO SUSP: 30.0000 mL | Freq: Four times a day (QID) | ORAL | Status: DC | PRN

## 2012-08-20 MED ORDER — DIPHENHYDRAMINE HCL 50 MG/ML IJ SOLN: 12.5000 mg | Freq: Four times a day (QID) | INTRAMUSCULAR | Status: DC | PRN

## 2012-08-20 MED ORDER — LACTATED RINGERS IV BOLUS (SEPSIS): 1000.0000 mL | Freq: Three times a day (TID) | INTRAVENOUS | Status: DC | PRN

## 2012-08-20 MED ORDER — BISACODYL 10 MG RE SUPP: 10.0000 mg | Freq: Two times a day (BID) | RECTAL | Status: DC | PRN

## 2012-08-20 MED ORDER — ACETAMINOPHEN 500 MG PO TABS
1000.0000 mg | ORAL_TABLET | Freq: Three times a day (TID) | ORAL | Status: DC
  Administered 2012-08-20 – 2012-08-21 (×4): 1000 mg via ORAL
  Filled 2012-08-20 (×9): qty 2

## 2012-08-20 MED ORDER — ACETAMINOPHEN 500 MG PO TABS: 1000.0000 mg | ORAL_TABLET | Freq: Three times a day (TID) | ORAL | Status: DC

## 2012-08-20 NOTE — Progress Notes (Signed)
Sandy Sutton [id number] 04-Aug-1955   Subjective:  Sore Tramadol not working Sleepy w Dilaudid Lightheaded walking Daughter in room  Objective:  Vital signs:  Filed Vitals:   08/19/12 1800 08/19/12 2209 08/20/12 0218 08/20/12 0618  BP: 105/70 101/67 110/69 112/70  Pulse: 65 63 65 67  Temp: 98 F (36.7 C) 97.8 F (36.6 C) 97.7 F (36.5 C) 97.8 F (36.6 C)  TempSrc: Oral Oral Oral Oral  Resp: 20 18 18 18   Height:      Weight:      SpO2: 95% 95% 95% 97%    Last BM Date: 08/19/12  Intake/Output   Yesterday:  03/07 0701 - 03/08 0700 In: 6213.0 [P.O.:720; I.V.:1914.2] Out: 1350 [Urine:1350] This shift:  Total I/O In: 422.5 [P.O.:240; I.V.:182.5] Out: 650 [Urine:650]  Bowel function:  Flatus: y  BM: n  Physical Exam:  General: Pt awake/alert/oriented x4 in no acute distress Eyes: PERRL, normal EOM.  Sclera clear.  No icterus Neuro: CN II-XII intact w/o focal sensory/motor deficits. Lymph: No head/neck/groin lymphadenopathy Psych:  No delerium/psychosis/paranoia HENT: Normocephalic, Mucus membranes moist.  No thrush.  No hoarseness Neck: Supple, No tracheal deviation.  Dressing removed.  Normal healing ridge.  No hematoma Chest: No chest wall pain w good excursion CV:  Pulses intact.  Regular rhythm MS: Normal AROM mjr joints.  No obvious deformity Abdomen: Soft.  Nondistended.  No incarcerated hernias. Ext:  SCDs BLE.  No mjr edema.  No cyanosis Skin: No petechiae / purpurae  Problem List:  Principal Problem:   Thyroid nodule, cold   Assessment  Sandy Sutton  57 y.o. female  1 Day Post-Op  Procedure(s): THYROID LOBECTOMY  Fair but getting better  Plan:  -max non-narcotic pain control -try oxycodone (prob w most narcotics) -adv diet -poss d/c later today vs tomorrow:  D/C patient from hospital when patient meets criteria:  Tolerating oral intake well Ambulating in walkways Adequate pain control without IV  medications Urinating  Having flatus   -VTE prophylaxis- SCDs, etc -mobilize as tolerated to help recovery  Ardeth Sportsman, M.D., F.A.C.S. Gastrointestinal and Minimally Invasive Surgery [facility] [address], [address] [place], [place] [address] [phone] Main / Paging [phone] Voice Mail   08/20/2012  CARE TEAM:  PCP: Geraldo Pitter, MD  Outpatient Care Team: Patient Care Team: Renaye Rakers, MD as PCP - General (Family Medicine)  Inpatient Treatment Team: Treatment Team: Attending Deryl Giroux: Velora Heckler, MD; Registered Nurse: Rometta Emery, RN   Results:   Labs: Results for orders placed during the hospital encounter of 08/19/12 (from the past 48 hour(s))  BASIC METABOLIC PANEL     Status: Abnormal   Collection Time    08/20/12  5:31 AM      Result Value Range   Sodium 137  135 - 145 mEq/L   Potassium 4.4  3.5 - 5.1 mEq/L   Chloride 103  96 - 112 mEq/L   CO2 26  19 - 32 mEq/L   Glucose, Bld 142 (*) 70 - 99 mg/dL   BUN 11  6 - 23 mg/dL   Creatinine, Ser 6.44  0.50 - 1.10 mg/dL   Calcium 9.0  8.4 - 03.4 mg/dL   GFR calc non Af Amer >90  >90 mL/min   GFR calc Af Amer >90  >90 mL/min   Comment:            The eGFR has been calculated     using  the CKD EPI equation.     This calculation has not been     validated in all clinical     situations.     eGFR's persistently     <90 mL/min signify     possible Chronic Kidney Disease.    Imaging / Studies: No results found.  Medications / Allergies: per chart  Antibiotics: Anti-infectives   Start     Dose/Rate Route Frequency Ordered Stop   08/19/12 1045  ceFAZolin (ANCEF) IVPB 2 g/50 mL premix     2 g 100 mL/hr over 30 Minutes Intravenous On call to O.R. 08/19/12 1026 08/19/12 1204

## 2012-08-20 NOTE — Progress Notes (Signed)
Dr. Maisie Fus aware via phone pt reported wanting to wait and dc home tomorrow instead of today. MD ok with that with order received to cancel dc order.

## 2012-08-20 NOTE — Op Note (Signed)
Sandy Sutton, Sandy Sutton NO.:  [id number]  MEDICAL RECORD NO.:  [id number]  LOCATION:  1538                         FACILITY:  [facility]  PHYSICIAN:  Velora Heckler, MD      DATE OF BIRTH:  [birth year]  DATE OF PROCEDURE:  08/19/2012                               OPERATIVE REPORT   PREOPERATIVE DIAGNOSIS:  Right thyroid nodule with compressive symptoms.  POSTOPERATIVE DIAGNOSIS:  Right thyroid nodule with compressive symptoms.  PROCEDURE:  Right thyroid lobectomy.  SURGEON:  Velora Heckler, MD, FACS   ANESTHESIA:  General per Dr. Ronelle Nigh.  PREPARATION:  ChloraPrep.  COMPLICATIONS:  None.  ESTIMATED BLOOD LOSS:  Minimal.  INDICATIONS:  The patient is a 57 year old, black female followed since 2012 with a dominant right thyroid nodule.  Over the past 2 years, she has developed progressive compressive symptoms including dysphagia, globus sensation, and vocal changes.  The patient has an ultrasound demonstrating a dominant nodule in the right inferior thyroid lobe measuring 3.7 cm.  The patient now comes to surgery for right thyroid lobectomy.  BODY OF REPORT:  Procedure was done in OR #6 at the [facility].  The patient was brought to the operating room, placed in the supine position on the operating room table.  Following administration of general anesthesia, the patient was positioned and then prepped and draped in the usual strict aseptic fashion.  After ascertaining that an adequate level of anesthesia been achieved, a Kocher incision was made with a #15 blade.  Dissection was carried through subcutaneous tissues.  Platysma was divided.  External jugular veins were suture ligated with 3-0 Vicryl suture ligatures.  Skin flaps were elevated cephalad and caudad from the thyroid notch to the sternal notch.  The Mahorner self-retaining retractor was placed for exposure. Strap muscles were incised on the midline, and dissection was begun  on the left side.  Left thyroid lobe was exposed.  Palpation showed it to be mildly enlarged, mildly firm, but without discrete or dominant mass. No gross abnormality was identified.  No lymphadenopathy was palpable.  Next, we turned our attention to the right side.  Strap muscles were again elevated and reflected to the right.  Right lower lobe was moderately enlarged.  There were multiple nodules with a dominant nodule in the inferior pole.  Gland was gently mobilized.  Venous tributaries were divided between medium Ligaclips with the Harmonic scalpel. Superior pole was dissected out and superior pole vessels divided individually between small and medium Ligaclips with the Harmonic scalpel.  Parathyroid tissue was identified and preserved.  Gland was rolled anteriorly.  Branches of the inferior thyroid artery were divided between small Ligaclips with the Harmonic scalpel.  Recurrent laryngeal nerve was identified and preserved along its length.  Ligament of Allyson Sabal was released with electrocautery, and the gland was mobilized onto the anterior trachea.  Inferior venous tributaries were divided between Ligaclips, and the gland was mobilized up and onto the anterior trachea. Isthmus was mobilized across the midline.  There was no significant pyramidal lobe.  Thyroid parenchyma was divided at the junction of the isthmus and left thyroid lobe with the Harmonic scalpel.  Right thyroid lobe was  submitted to Pathology for review.  The neck was irrigated with warm saline.  Good hemostasis was achieved. Fibrillar was placed throughout the operative field.  Strap muscles were reapproximated in the midline with interrupted 3-0 Vicryl sutures. Platysma was closed with interrupted 3-0 Vicryl sutures.  Skin was closed with a running 4-0 Monocryl subcuticular suture.  Wound was washed and dried, and benzoin and Steri-Strips were applied.  Sterile dressings were applied.  The patient was awakened from  anesthesia and brought to the recovery room.  The patient tolerated the procedure well.   Velora Heckler, MD, FACS General & Endocrine Surgery [facility] Office: [phone]   TMG/MEDQ  D:  08/19/2012  T:  08/20/2012  Job:  [id number]  cc:   Renaye Rakers, M.D. Fax: [phone]

## 2012-08-21 LAB — BASIC METABOLIC PANEL
BUN: 11 mg/dL (ref 6–23)
CO2: 27 mEq/L (ref 19–32)
Calcium: 8.8 mg/dL (ref 8.4–10.5)
Chloride: 103 mEq/L (ref 96–112)
Creatinine, Ser: 0.81 mg/dL (ref 0.50–1.10)
GFR calc Af Amer: >90 mL/min (ref >90)
GFR calc non Af Amer: 79 mL/min — ABNORMAL LOW (ref >90)
Glucose, Bld: 89 mg/dL (ref 70–99)
Potassium: 3.8 mEq/L (ref 3.5–5.1)
Sodium: 137 mEq/L (ref 135–145)

## 2012-08-21 NOTE — Progress Notes (Signed)
Assessment unchanged. In good spirits, ready for dc home. Denies pain. Pt verbalized understanding of dc instructions and able to verbalize to nurse when follow-up appt is due. Also able to tell nurse s/o tetany and when to call MD. Script x1 given as provided by MD. Introduced to My Chart with verbalized understanding. Discharged via wc to front entrance to meet awaiting vehicle to carry home. Accompanied by son and NT.

## 2012-08-21 NOTE — Care Management Note (Signed)
    Page 1 of 1   08/21/2012     4:16:54 PM   CARE MANAGEMENT NOTE 08/21/2012  Patient:  Sandy Sutton, Sandy Sutton   Account Number:  [id number]  Date Initiated:  08/21/2012  Documentation initiated by:  Lanier Clam  Subjective/Objective Assessment:   ADMITTED W/THYROID NODULE.     Action/Plan:   FROM HOME   Anticipated DC Date:  08/21/2012   Anticipated DC Plan:  HOME/SELF CARE      DC Planning Services  CM consult      Choice offered to / List presented to:             Status of service:  Completed, signed off Medicare Important Message given?   (If response is "NO", the following Medicare IM given date fields will be blank) Date Medicare IM given:   Date Additional Medicare IM given:    Discharge Disposition:  HOME/SELF CARE  Per UR Regulation:  Reviewed for med. necessity/level of care/duration of stay  If discussed at Long Length of Stay Meetings, dates discussed:    Comments:  08/21/12 KATHY MAHABIR RN,BSN NCM [phone] D/C HOME NO NEEDS, OR ORDERS.

## 2012-08-21 NOTE — Discharge Summary (Signed)
Physician Discharge Summary  Patient ID: Sandy Sutton MRN: [id number] DOB/AGE: Aug 17, 1955 57 y.o.  Admit date: 08/19/2012 Discharge date: 08/21/2012  Admission Diagnoses:  Discharge Diagnoses:  Principal Problem:   Thyroid nodule, cold   Discharged Condition: good  Hospital Course: Postoperatively, the patient mobilized and advanced to a solid diet gradually.  Sore & dysphagia POD#1 but better.  Pain was well-controlled and transitioned off IV medications.    By the time of discharge, the patient was walking well the hallways, eating food well, having flatus.  Pain was-controlled on an oral regimen.  Based on meeting DC criteria and recovering well, I felt it was safe for the patient to be discharged home with close followup.  Instructions were discussed in detail.  They are written as well.     Consults: None  Significant Diagnostic Studies:   Treatments: surgery: Right thyroid lobectomy   Discharge Exam: Blood pressure 101/66, pulse 81, temperature 97.8 F (36.6 C), temperature source Oral, resp. rate 18, height 5\' 3"  (1.6 m), weight 165 lb (74.844 kg), SpO2 96.00%.  General: Pt awake/alert/oriented x4 in no major acute distress Eyes: PERRL, normal EOM. Sclera nonicteric Neuro: CN II-XII intact w/o focal sensory/motor deficits. Lymph: No head/neck/groin lymphadenopathy Psych:  No delerium/psychosis/paranoia HENT: Normocephalic, Mucus membranes moist.  No thrush Neck: Supple, No tracheal deviation.  Dressing c/d/i.  Normal healing ridge Chest: No pain.  Good respiratory excursion. CV:  Pulses intact.  Regular rhythm MS: Normal AROM mjr joints.  No obvious deformity Abdomen: Soft, Nondistended.  Nontender.  No incarcerated hernias. Ext:  SCDs BLE.  No significant edema.  No cyanosis Skin: No petechiae / purpurae   Disposition: Final discharge disposition not confirmed  Discharge Orders   Future Appointments Nataniel Gasper Department Dept Phone   09/14/2012 9:30 AM Velora Heckler, MD [facility] [place] [phone]   Future Orders Complete By Expires     Diet - low sodium heart healthy  As directed     Increase activity slowly  As directed         Medication List    TAKE these medications       CENTRUM SILVER ADULT 50+ Tabs  Take 1 tablet by mouth daily.     oxyCODONE 5 MG immediate release tablet  Commonly known as:  Oxy IR/ROXICODONE  Take 1-2 tablets (5-10 mg total) by mouth every 4 (four) hours as needed.     promethazine 12.5 MG tablet  Commonly known as:  PHENERGAN  Take 1-2 tablets (12.5-25 mg total) by mouth every 6 (six) hours as needed for nausea.     promethazine 25 MG suppository  Commonly known as:  PHENERGAN  Place 1 suppository (25 mg total) rectally every 6 (six) hours as needed for nausea.           Follow-up Information   Follow up with Velora Heckler, MD. Schedule an appointment as soon as possible for a visit in 2 weeks.   Contact information:   [address] [place] [address] [phone]       Signed: Ardeth Sportsman. 08/21/2012, 10:13 AM

## 2012-08-22 ENCOUNTER — Encounter (HOSPITAL_COMMUNITY): Payer: Self-pay | Admitting: Surgery

## 2012-08-22 NOTE — Progress Notes (Signed)
Quick Note:  Please contact patient and notify of benign pathology results.  Avyay Coger M. Jordani Nunn, MD, FACS [facility] Office: [phone]   ______ 

## 2012-08-23 ENCOUNTER — Telehealth (INDEPENDENT_AMBULATORY_CARE_PROVIDER_SITE_OTHER): Payer: Self-pay

## 2012-08-23 ENCOUNTER — Encounter (INDEPENDENT_AMBULATORY_CARE_PROVIDER_SITE_OTHER): Payer: Self-pay

## 2012-08-23 NOTE — Telephone Encounter (Signed)
Pt home doing well. Path result given to pt. Pt request rtw note for 4hrs a day to start 08-29-12 and full ours 09-05-12. Not completed in epic and mailed to pt.

## 2012-08-31 ENCOUNTER — Telehealth (INDEPENDENT_AMBULATORY_CARE_PROVIDER_SITE_OTHER): Payer: Self-pay

## 2012-08-31 ENCOUNTER — Encounter (INDEPENDENT_AMBULATORY_CARE_PROVIDER_SITE_OTHER): Payer: Self-pay

## 2012-08-31 NOTE — Telephone Encounter (Signed)
Pt called stating she has changed her mind and does want to take her full 2 weeks off to recover. Pt orig rtw date was 09-05-12. Pt advised letter complete and at front desk to pick up.

## 2012-09-14 ENCOUNTER — Encounter (INDEPENDENT_AMBULATORY_CARE_PROVIDER_SITE_OTHER): Payer: BC Managed Care – PPO | Admitting: Surgery

## 2012-09-19 ENCOUNTER — Encounter (INDEPENDENT_AMBULATORY_CARE_PROVIDER_SITE_OTHER): Payer: Self-pay | Admitting: Surgery

## 2012-09-19 ENCOUNTER — Ambulatory Visit (INDEPENDENT_AMBULATORY_CARE_PROVIDER_SITE_OTHER): Payer: BC Managed Care – PPO | Admitting: Surgery

## 2012-09-19 VITALS — BP 96/64 | HR 84 | Temp 97.2°F | Resp 20 | Ht 63.0 in | Wt 163.0 lb

## 2012-09-19 DIAGNOSIS — E041 Nontoxic single thyroid nodule: Secondary | ICD-10-CM

## 2012-09-19 NOTE — Patient Instructions (Signed)
  COCOA BUTTER & VITAMIN E CREAM  (Palmer's or other brand)  Apply cocoa butter/vitamin E cream to your incision 2 - 3 times daily.  Massage cream into incision for one minute with each application.  Use sunscreen (50 SPF or higher) for first 6 months after surgery if area is exposed to sun.  You may substitute Mederma or other scar reducing creams as desired.   

## 2012-09-19 NOTE — Progress Notes (Signed)
General Surgery [facility]  Visit Diagnoses: 1. Thyroid nodule, cold     HISTORY: Patient returns for her first postoperative visit having undergone right thyroid lobectomy 4 weeks ago. Final pathology shows adenomatoid nodules with no evidence of atypia or malignancy. Postoperative course has been uneventful.  EXAM: Surgical wound is healing nicely. Mild soft tissue swelling. No sign of seroma. No sign of infection. Voice quality is normal.  IMPRESSION: Status post right thyroid lobectomy, final pathology benign  PLAN: Patient will begin applying topical creams to her incision. We will check a TSH level at this time to determine whether she will need thyroid hormone supplementation. We will contact her with the results.  Patient will return in 6 weeks for wound check and review of laboratory studies.  Velora Heckler, MD, FACS General & Endocrine Surgery [facility]

## 2012-09-29 ENCOUNTER — Telehealth (INDEPENDENT_AMBULATORY_CARE_PROVIDER_SITE_OTHER): Payer: Self-pay

## 2012-09-29 NOTE — Telephone Encounter (Signed)
Pt was to have tsh at [facility] on 09-19-12. No record at [facility] that pt went. LMOM for pt to call me. We need to know if pt did have labs and if so where. If not pt needs to go to [facility].

## 2012-10-03 ENCOUNTER — Encounter (INDEPENDENT_AMBULATORY_CARE_PROVIDER_SITE_OTHER): Payer: Self-pay

## 2012-10-03 ENCOUNTER — Telehealth (INDEPENDENT_AMBULATORY_CARE_PROVIDER_SITE_OTHER): Payer: Self-pay

## 2012-10-03 NOTE — Telephone Encounter (Signed)
TSH here to Dr Gerrit Friends to review and advise if any medication is required. Awaiting his response.

## 2012-10-05 ENCOUNTER — Encounter (INDEPENDENT_AMBULATORY_CARE_PROVIDER_SITE_OTHER): Payer: Self-pay

## 2012-11-02 ENCOUNTER — Ambulatory Visit (INDEPENDENT_AMBULATORY_CARE_PROVIDER_SITE_OTHER): Payer: BC Managed Care – PPO | Admitting: Surgery

## 2012-11-02 ENCOUNTER — Encounter (INDEPENDENT_AMBULATORY_CARE_PROVIDER_SITE_OTHER): Payer: Self-pay | Admitting: Surgery

## 2012-11-02 VITALS — BP 122/66 | HR 84 | Temp 97.7°F | Resp 16 | Ht 64.0 in | Wt 164.8 lb

## 2012-11-02 DIAGNOSIS — E041 Nontoxic single thyroid nodule: Secondary | ICD-10-CM

## 2012-11-02 NOTE — Progress Notes (Signed)
General Surgery [facility]  Visit Diagnoses: 1. Thyroid nodule, cold     HISTORY: Patient returns for final postoperative visit having undergone thyroid lobectomy for benign disease. Follow-up TSH level is normal at 1.21. Patient will not require thyroid hormone supplementation.  EXAM: Surgical wound is well-healed. No signs or symptoms of infection. Minimal soft tissue swelling. Good cosmetic result.  IMPRESSION: Status post thyroid lobectomy  PLAN: Patient will continue to apply topical creams and massage the scar. She does not require thyroid hormone supplementation. She will see her primary care physician for regular follow-up.  Patient will return for surgical care as needed.  Velora Heckler, MD, FACS General & Endocrine Surgery [facility]

## 2012-11-02 NOTE — Patient Instructions (Signed)
  COCOA BUTTER & VITAMIN E CREAM  (Palmer's or other brand)  Apply cocoa butter/vitamin E cream to your incision 2 - 3 times daily.  Massage cream into incision for one minute with each application.  Use sunscreen (50 SPF or higher) for first 6 months after surgery if area is exposed to sun.  You may substitute Mederma or other scar reducing creams as desired.      Thyroid-Stimulating Hormone The amount of thyroid-stimulating hormone (TSH) or thyrotropin can be measured from a sample of blood. The TSH level can help diagnose thyroid gland or pituitary gland disorders, or monitor treatment of hypothyroidism and hyperthyroidism. TSH is produced by the pituitary gland, a tiny organ located below the brain. The pituitary gland is part of the body's feedback system to maintain stable levels of thyroid hormones released into the blood. Thyroid hormones help control the rate at which the body uses energy. The pituitary gland monitors the level of thyroid hormones released by the thyroid gland. The thyroid gland is a small butterfly-shaped gland that lies flat against the windpipe. If the thyroid gland does not release enough thyroid hormones, the pituitary gland detects the reduced thyroid hormone levels. The pituitary gland then makes more TSH to trigger the thyroid gland to produce more thyroid hormones. This increase in TSH is an effort to return the low thyroid hormones to normal levels. The increased TSH level is caused by the low thyroid hormone levels of an underactive thyroid (hypothyroidism). Symptoms of hypothyroidism include menstrual irregularities in women, weight gain, dry skin, constipation, cold intolerance, and fatigue. Rarely, a high TSH level can indicate a problem with the pituitary gland. A high TSH level could also occur when there is an insufficient level of thyroid hormone medication in individuals receiving that medication. If the thyroid gland releases too much thyroid hormones,  the pituitary gland detects the increased thyroid hormone levels. The pituitary gland then makes less TSH to slow the thyroid gland from producing thyroid hormones. This decrease in TSH is an effort to return the increased thyroid hormones to normal levels. The decreased TSH level is caused by the excess thyroid hormone levels of an overactive thyroid gland (hyperthyroidism). Symptoms associated with hyperthyroidism include rapid heart rate, weight loss, nervousness, hand tremors, irritated eyes, and difficulty sleeping. Rarely, a low TSH level can indicate a problem with the pituitary gland. PREPARATION FOR TEST No specific preparation is required for this blood test. A blood sample is obtained from a needle placed in a vein in your arm or from pricking the heel of an infant.  NORMAL FINDINGS  Adult: 0.5-5 milli-international Units/L (0.5-5 mIU/L)  Newborn: 3-20 milli-international Units/L (3-20 mIU/L)  Cord: 3-12 milli-international Units/L (3-12 mIU/L) Ranges for normal findings may vary among different laboratories and hospitals. You should always check with your doctor after having lab work or other tests done to discuss the meaning of your test results and whether your values are considered within normal limits. MEANING OF TEST  Your caregiver will go over the test results with you and discuss the importance and meaning of your results, as well as treatment options and the need for additional tests if necessary. OBTAINING THE TEST RESULTS It is your responsibility to obtain your test results. Ask the lab or department performing the test when and how you will get your results. Document Released: 06/26/2004 Document Revised: 08/24/2011 Document Reviewed: 05/13/2008 [facility] Patient Information 2014 [place], [place].

## 2012-11-09 ENCOUNTER — Other Ambulatory Visit (HOSPITAL_COMMUNITY): Payer: Self-pay | Admitting: Obstetrics

## 2012-11-09 DIAGNOSIS — Z1231 Encounter for screening mammogram for malignant neoplasm of breast: Secondary | ICD-10-CM

## 2012-11-10 ENCOUNTER — Ambulatory Visit (HOSPITAL_COMMUNITY)
Admission: RE | Admit: 2012-11-10 | Discharge: 2012-11-10 | Disposition: A | Discharge status: Home / Self Care | Payer: BC Managed Care – PPO | Source: Ambulatory Visit | Attending: Obstetrics | Admitting: Obstetrics

## 2012-11-10 DIAGNOSIS — Z1231 Encounter for screening mammogram for malignant neoplasm of breast: Secondary | ICD-10-CM | POA: Insufficient documentation

## 2012-11-11 ENCOUNTER — Other Ambulatory Visit: Payer: Self-pay | Admitting: Orthopedic Surgery

## 2012-11-11 ENCOUNTER — Ambulatory Visit
Admission: RE | Admit: 2012-11-11 | Discharge: 2012-11-11 | Disposition: A | Discharge status: Home / Self Care | Payer: BC Managed Care – PPO | Source: Ambulatory Visit | Attending: Orthopedic Surgery | Admitting: Orthopedic Surgery

## 2012-11-11 DIAGNOSIS — IMO0001 Reserved for inherently not codable concepts without codable children: Secondary | ICD-10-CM

## 2012-11-11 DIAGNOSIS — T1490XA Injury, unspecified, initial encounter: Secondary | ICD-10-CM

## 2013-02-15 ENCOUNTER — Other Ambulatory Visit: Payer: Self-pay | Admitting: Orthopedic Surgery

## 2013-02-15 ENCOUNTER — Ambulatory Visit
Admission: RE | Admit: 2013-02-15 | Discharge: 2013-02-15 | Disposition: A | Discharge status: Home / Self Care | Payer: BC Managed Care – PPO | Source: Ambulatory Visit | Attending: Orthopedic Surgery | Admitting: Orthopedic Surgery

## 2013-02-15 DIAGNOSIS — M542 Cervicalgia: Secondary | ICD-10-CM

## 2013-03-29 ENCOUNTER — Other Ambulatory Visit: Payer: Self-pay | Admitting: Orthopedic Surgery

## 2013-03-29 DIAGNOSIS — M542 Cervicalgia: Secondary | ICD-10-CM

## 2013-04-06 ENCOUNTER — Other Ambulatory Visit: Payer: BC Managed Care – PPO

## 2013-04-06 ENCOUNTER — Ambulatory Visit
Admission: RE | Admit: 2013-04-06 | Discharge: 2013-04-06 | Disposition: A | Discharge status: Home / Self Care | Payer: BC Managed Care – PPO | Source: Ambulatory Visit | Attending: Orthopedic Surgery | Admitting: Orthopedic Surgery

## 2013-04-06 DIAGNOSIS — M542 Cervicalgia: Secondary | ICD-10-CM

## 2013-06-15 DIAGNOSIS — Z8709 Personal history of other diseases of the respiratory system: Secondary | ICD-10-CM

## 2013-06-15 HISTORY — PX: KNEE ARTHROSCOPY: SUR90

## 2013-06-15 HISTORY — DX: Personal history of other diseases of the respiratory system: Z87.09

## 2014-01-18 ENCOUNTER — Ambulatory Visit
Admission: RE | Admit: 2014-01-18 | Discharge: 2014-01-18 | Disposition: A | Discharge status: Home / Self Care | Payer: BC Managed Care – PPO | Source: Ambulatory Visit | Attending: Orthopedic Surgery | Admitting: Orthopedic Surgery

## 2014-01-18 ENCOUNTER — Other Ambulatory Visit: Payer: Self-pay | Admitting: Orthopedic Surgery

## 2014-01-18 DIAGNOSIS — M5432 Sciatica, left side: Secondary | ICD-10-CM

## 2014-01-18 DIAGNOSIS — M2392 Unspecified internal derangement of left knee: Secondary | ICD-10-CM

## 2014-02-14 ENCOUNTER — Other Ambulatory Visit: Payer: Self-pay | Admitting: Orthopedic Surgery

## 2014-02-14 DIAGNOSIS — M25562 Pain in left knee: Secondary | ICD-10-CM

## 2014-02-20 ENCOUNTER — Ambulatory Visit
Admission: RE | Admit: 2014-02-20 | Discharge: 2014-02-20 | Disposition: A | Discharge status: Home / Self Care | Payer: BC Managed Care – PPO | Source: Ambulatory Visit | Attending: Orthopedic Surgery | Admitting: Orthopedic Surgery

## 2014-02-20 DIAGNOSIS — M25562 Pain in left knee: Secondary | ICD-10-CM

## 2014-06-15 HISTORY — PX: OTHER SURGICAL HISTORY: SHX169

## 2015-02-19 ENCOUNTER — Other Ambulatory Visit (HOSPITAL_COMMUNITY): Payer: Self-pay | Admitting: Orthopaedic Surgery

## 2015-03-01 ENCOUNTER — Inpatient Hospital Stay (HOSPITAL_COMMUNITY): Admission: RE | Admit: 2015-03-01 | Payer: No Typology Code available for payment source | Source: Ambulatory Visit

## 2015-03-06 ENCOUNTER — Encounter (HOSPITAL_COMMUNITY): Payer: Self-pay

## 2015-03-06 ENCOUNTER — Ambulatory Visit (HOSPITAL_COMMUNITY)
Admission: RE | Admit: 2015-03-06 | Discharge: 2015-03-06 | Disposition: A | Discharge status: Home / Self Care | Payer: BLUE CROSS/BLUE SHIELD | Source: Ambulatory Visit | Attending: Orthopaedic Surgery | Admitting: Orthopaedic Surgery

## 2015-03-06 ENCOUNTER — Encounter (HOSPITAL_COMMUNITY)
Admission: RE | Admit: 2015-03-06 | Discharge: 2015-03-06 | Disposition: A | Discharge status: Home / Self Care | Payer: BLUE CROSS/BLUE SHIELD | Source: Ambulatory Visit | Attending: Orthopaedic Surgery | Admitting: Orthopaedic Surgery

## 2015-03-06 DIAGNOSIS — Z01818 Encounter for other preprocedural examination: Secondary | ICD-10-CM | POA: Insufficient documentation

## 2015-03-06 DIAGNOSIS — Z01812 Encounter for preprocedural laboratory examination: Secondary | ICD-10-CM | POA: Diagnosis not present

## 2015-03-06 DIAGNOSIS — M179 Osteoarthritis of knee, unspecified: Secondary | ICD-10-CM | POA: Insufficient documentation

## 2015-03-06 DIAGNOSIS — M1710 Unilateral primary osteoarthritis, unspecified knee: Secondary | ICD-10-CM

## 2015-03-06 HISTORY — DX: Weakness: R53.1

## 2015-03-06 HISTORY — DX: Pneumonia, unspecified organism: J18.9

## 2015-03-06 HISTORY — DX: Gout, unspecified: M10.9

## 2015-03-06 HISTORY — DX: Gastro-esophageal reflux disease without esophagitis: K21.9

## 2015-03-06 HISTORY — DX: Pain in unspecified joint: M25.50

## 2015-03-06 HISTORY — DX: Personal history of colonic polyps: Z86.010

## 2015-03-06 HISTORY — DX: Frequency of micturition: R35.0

## 2015-03-06 HISTORY — DX: Personal history of other diseases of the nervous system and sense organs: Z86.69

## 2015-03-06 HISTORY — DX: Personal history of peptic ulcer disease: Z87.11

## 2015-03-06 HISTORY — DX: Personal history of other diseases of the digestive system: Z87.19

## 2015-03-06 HISTORY — DX: Urgency of urination: R39.15

## 2015-03-06 HISTORY — DX: Personal history of other diseases of the respiratory system: Z87.09

## 2015-03-06 HISTORY — DX: Effusion, unspecified joint: M25.40

## 2015-03-06 LAB — CBC
HCT: 36.8 % (ref 36.0–46.0)
Hemoglobin: 12 g/dL (ref 12.0–15.0)
MCH: 29.7 pg (ref 26.0–34.0)
MCHC: 32.6 g/dL (ref 30.0–36.0)
MCV: 91.1 fL (ref 78.0–100.0)
Platelets: 275 10*3/uL (ref 150–400)
RBC: 4.04 MIL/uL (ref 3.87–5.11)
RDW: 13 % (ref 11.5–15.5)
WBC: 7.6 10*3/uL (ref 4.0–10.5)

## 2015-03-06 LAB — URINALYSIS, ROUTINE W REFLEX MICROSCOPIC
Bilirubin Urine: NEGATIVE
Glucose, UA: NEGATIVE mg/dL
Hgb urine dipstick: NEGATIVE
Ketones, ur: NEGATIVE mg/dL
Leukocytes, UA: NEGATIVE
Nitrite: NEGATIVE
Protein, ur: NEGATIVE mg/dL
Specific Gravity, Urine: 1.02 (ref 1.005–1.030)
Urobilinogen, UA: 1 mg/dL (ref 0.0–1.0)
pH: 6.5 (ref 5.0–8.0)

## 2015-03-06 LAB — COMPREHENSIVE METABOLIC PANEL
ALT: 22 U/L (ref 14–54)
AST: 29 U/L (ref 15–41)
Albumin: 3.6 g/dL (ref 3.5–5.0)
Alkaline Phosphatase: 93 U/L (ref 38–126)
Anion gap: 7 (ref 5–15)
BUN: 10 mg/dL (ref 6–20)
CO2: 26 mmol/L (ref 22–32)
Calcium: 9.3 mg/dL (ref 8.9–10.3)
Chloride: 105 mmol/L (ref 101–111)
Creatinine, Ser: 1.05 mg/dL — ABNORMAL HIGH (ref 0.44–1.00)
GFR calc Af Amer: >60 mL/min (ref >60)
GFR calc non Af Amer: 57 mL/min — ABNORMAL LOW (ref >60)
Glucose, Bld: 93 mg/dL (ref 65–99)
Potassium: 4.1 mmol/L (ref 3.5–5.1)
Sodium: 138 mmol/L (ref 135–145)
Total Bilirubin: 0.6 mg/dL (ref 0.3–1.2)
Total Protein: 7.2 g/dL (ref 6.5–8.1)

## 2015-03-06 LAB — SURGICAL PCR SCREEN
MRSA, PCR: NEGATIVE
Staphylococcus aureus: NEGATIVE

## 2015-03-06 LAB — PROTIME-INR
INR: 1.04 (ref 0.00–1.49)
Prothrombin Time: 13.8 seconds (ref 11.6–15.2)

## 2015-03-06 MED ORDER — CHLORHEXIDINE GLUCONATE 4 % EX LIQD: 60.0000 mL | Freq: Once | CUTANEOUS | Status: DC

## 2015-03-06 NOTE — Progress Notes (Signed)
Medical Md is Dr.Bland   Denies ever having a heart cath  Echo per pt around 2005  Stress test in epic from 2012  Denies EKG or CXR in past yr

## 2015-03-06 NOTE — Progress Notes (Signed)
Medical Md  Cardiologist  Echo  Stress test  Heart cath  EKG  CXR

## 2015-03-06 NOTE — Pre-Procedure Instructions (Signed)
Sandy Sutton  03/06/2015      [id number] Lorina Rabon, [facility] [place] [address] Phone: [phone] Fax: [phone] [address] [place], [place] - [address] [place] [address] Phone: [phone] Fax: [phone]    Your procedure is scheduled on Wed, Sept 28 @ 12:30 PM  Report to [facility] Admitting at 10:30 AM  Call this number if you have problems the morning of surgery:  [phone]   Remember:  Do not eat food or drink liquids after midnight.                No Goody's,BC's,Aleve,Aspirin,Ibuprofen,Fish Oil,or any Herbal Medications.    Do not wear jewelry, make-up or nail polish.  Do not wear lotions, powders, or perfumes.  You may wear deodorant.  Do not shave 48 hours prior to surgery.    Do not bring valuables to the hospital.  [facility] [place] is not responsible for any belongings or valuables.  Contacts, dentures or bridgework may not be worn into surgery.  Leave your suitcase in the car.  After surgery it may be brought to your room.  For patients admitted to the hospital, discharge time will be determined by your treatment team.  Patients discharged the day of surgery will not be allowed to drive home.    Special instructions:  [facility] - Preparing for Surgery  Before surgery, you can play an important role.  Because skin is not sterile, your skin needs to be as free of germs as possible.  You can reduce the number of germs on you skin by washing with CHG (chlorahexidine gluconate) soap before surgery.  CHG is an antiseptic cleaner which kills germs and bonds with the skin to continue killing germs even after washing.  Please DO NOT use if you have an allergy to CHG or antibacterial soaps.  If your skin becomes reddened/irritated stop using the CHG and inform your nurse when you arrive at Short Stay.  Do not shave (including legs and  underarms) for at least 48 hours prior to the first CHG shower.  You may shave your face.  Please follow these instructions carefully:   1.  Shower with CHG Soap the night before surgery and the                                morning of Surgery.  2.  If you choose to wash your hair, wash your hair first as usual with your       normal shampoo.  3.  After you shampoo, rinse your hair and body thoroughly to remove the                      Shampoo.  4.  Use CHG as you would any other liquid soap.  You can apply chg directly       to the skin and wash gently with scrungie or a clean washcloth.  5.  Apply the CHG Soap to your body ONLY FROM THE NECK DOWN.        Do not use on open wounds or open sores.  Avoid contact with your eyes,       ears, mouth and genitals (private parts).  Wash genitals (private parts)       with your normal soap.  6.  Wash thoroughly,  paying special attention to the area where your surgery        will be performed.  7.  Thoroughly rinse your body with warm water from the neck down.  8.  DO NOT shower/wash with your normal soap after using and rinsing off       the CHG Soap.  9.  Pat yourself dry with a clean towel.            10.  Wear clean pajamas.            11.  Place clean sheets on your bed the night of your first shower and do not        sleep with pets.  Day of Surgery  Do not apply any lotions/deoderants the morning of surgery.  Please wear clean clothes to the hospital/surgery center.    Please read over the following fact sheets that you were given. Pain Booklet, Coughing and Deep Breathing, MRSA Information and Surgical Site Infection Prevention

## 2015-03-12 MED ORDER — CEFAZOLIN SODIUM-DEXTROSE 2-3 GM-% IV SOLR
2.0000 g | INTRAVENOUS | Status: AC
  Administered 2015-03-13: 2 g via INTRAVENOUS
  Filled 2015-03-12: qty 50

## 2015-03-13 ENCOUNTER — Inpatient Hospital Stay (HOSPITAL_COMMUNITY)
Admission: RE | Admit: 2015-03-13 | Discharge: 2015-03-16 | DRG: 470 | Disposition: A | Discharge status: Home Health | Payer: BLUE CROSS/BLUE SHIELD | Source: Ambulatory Visit | Attending: Orthopaedic Surgery | Admitting: Orthopaedic Surgery

## 2015-03-13 ENCOUNTER — Encounter (HOSPITAL_COMMUNITY): Payer: Self-pay

## 2015-03-13 ENCOUNTER — Encounter (HOSPITAL_COMMUNITY)
Admission: RE | Disposition: A | Discharge status: Home Health | Payer: Self-pay | Source: Ambulatory Visit | Attending: Orthopaedic Surgery

## 2015-03-13 ENCOUNTER — Inpatient Hospital Stay (HOSPITAL_COMMUNITY): Payer: BLUE CROSS/BLUE SHIELD | Admitting: Anesthesiology

## 2015-03-13 DIAGNOSIS — Z888 Allergy status to other drugs, medicaments and biological substances status: Secondary | ICD-10-CM

## 2015-03-13 DIAGNOSIS — K219 Gastro-esophageal reflux disease without esophagitis: Secondary | ICD-10-CM | POA: Diagnosis present

## 2015-03-13 DIAGNOSIS — Z8249 Family history of ischemic heart disease and other diseases of the circulatory system: Secondary | ICD-10-CM | POA: Diagnosis not present

## 2015-03-13 DIAGNOSIS — Z7982 Long term (current) use of aspirin: Secondary | ICD-10-CM | POA: Diagnosis not present

## 2015-03-13 DIAGNOSIS — Z881 Allergy status to other antibiotic agents status: Secondary | ICD-10-CM | POA: Diagnosis not present

## 2015-03-13 DIAGNOSIS — Z820 Family history of epilepsy and other diseases of the nervous system: Secondary | ICD-10-CM | POA: Diagnosis not present

## 2015-03-13 DIAGNOSIS — M1712 Unilateral primary osteoarthritis, left knee: Principal | ICD-10-CM

## 2015-03-13 DIAGNOSIS — Z885 Allergy status to narcotic agent status: Secondary | ICD-10-CM | POA: Diagnosis not present

## 2015-03-13 DIAGNOSIS — M25562 Pain in left knee: Secondary | ICD-10-CM | POA: Diagnosis present

## 2015-03-13 HISTORY — PX: KNEE ARTHROPLASTY: SHX992

## 2015-03-13 SURGERY — ARTHROPLASTY, KNEE, TOTAL, USING IMAGELESS COMPUTER-ASSISTED NAVIGATION
Anesthesia: Regional | Site: Knee | Laterality: Left

## 2015-03-13 MED ORDER — MIDAZOLAM HCL 2 MG/2ML IJ SOLN
INTRAMUSCULAR | Status: AC
  Filled 2015-03-13: qty 4

## 2015-03-13 MED ORDER — LACTATED RINGERS IV SOLN
INTRAVENOUS | Status: DC
  Administered 2015-03-13: 12:00:00 via INTRAVENOUS

## 2015-03-13 MED ORDER — METOCLOPRAMIDE HCL 5 MG/ML IJ SOLN: 5.0000 mg | Freq: Three times a day (TID) | INTRAMUSCULAR | Status: DC | PRN

## 2015-03-13 MED ORDER — ASPIRIN 81 MG PO CHEW
81.0000 mg | CHEWABLE_TABLET | Freq: Every day | ORAL | Status: DC
  Administered 2015-03-14: 81 mg via ORAL
  Filled 2015-03-13 (×2): qty 1

## 2015-03-13 MED ORDER — PROPOFOL 10 MG/ML IV BOLUS
INTRAVENOUS | Status: AC
  Filled 2015-03-13: qty 20

## 2015-03-13 MED ORDER — FENTANYL CITRATE (PF) 100 MCG/2ML IJ SOLN
INTRAMUSCULAR | Status: DC | PRN
  Administered 2015-03-13: 50 ug via INTRAVENOUS
  Administered 2015-03-13: 100 ug via INTRAVENOUS
  Administered 2015-03-13: 50 ug via INTRAVENOUS

## 2015-03-13 MED ORDER — MENTHOL 3 MG MT LOZG: 1.0000 | LOZENGE | OROMUCOSAL | Status: DC | PRN

## 2015-03-13 MED ORDER — FENTANYL CITRATE (PF) 250 MCG/5ML IJ SOLN
INTRAMUSCULAR | Status: AC
  Filled 2015-03-13: qty 5

## 2015-03-13 MED ORDER — PROPOFOL 500 MG/50ML IV EMUL
INTRAVENOUS | Status: DC | PRN
  Administered 2015-03-13: 75 ug/kg/min via INTRAVENOUS

## 2015-03-13 MED ORDER — SODIUM CHLORIDE 0.9 % IR SOLN
Status: DC | PRN
  Administered 2015-03-13: 1000 mL

## 2015-03-13 MED ORDER — ONDANSETRON HCL 4 MG/2ML IJ SOLN
INTRAMUSCULAR | Status: AC
  Filled 2015-03-13: qty 2

## 2015-03-13 MED ORDER — ACETAMINOPHEN 650 MG RE SUPP: 650.0000 mg | Freq: Four times a day (QID) | RECTAL | Status: DC | PRN

## 2015-03-13 MED ORDER — BUPIVACAINE HCL (PF) 0.25 % IJ SOLN
INTRAMUSCULAR | Status: DC | PRN
  Administered 2015-03-13: 30 mL

## 2015-03-13 MED ORDER — METHOCARBAMOL 1000 MG/10ML IJ SOLN
500.0000 mg | Freq: Four times a day (QID) | INTRAVENOUS | Status: DC | PRN
  Filled 2015-03-13: qty 5

## 2015-03-13 MED ORDER — BUPIVACAINE LIPOSOME 1.3 % IJ SUSP
20.0000 mL | INTRAMUSCULAR | Status: AC
  Administered 2015-03-13: 20 mL
  Filled 2015-03-13: qty 20

## 2015-03-13 MED ORDER — ASPIRIN EC 325 MG PO TBEC
325.0000 mg | DELAYED_RELEASE_TABLET | Freq: Every day | ORAL | Status: DC
  Administered 2015-03-14 – 2015-03-16 (×3): 325 mg via ORAL
  Filled 2015-03-13 (×3): qty 1

## 2015-03-13 MED ORDER — POLYETHYLENE GLYCOL 3350 17 G PO PACK: 17.0000 g | PACK | Freq: Every day | ORAL | Status: DC | PRN

## 2015-03-13 MED ORDER — MIDAZOLAM HCL 5 MG/5ML IJ SOLN
INTRAMUSCULAR | Status: DC | PRN
  Administered 2015-03-13: 2 mg via INTRAVENOUS

## 2015-03-13 MED ORDER — METHOCARBAMOL 500 MG PO TABS
500.0000 mg | ORAL_TABLET | Freq: Four times a day (QID) | ORAL | Status: DC | PRN
  Administered 2015-03-13 – 2015-03-15 (×6): 500 mg via ORAL
  Filled 2015-03-13 (×7): qty 1

## 2015-03-13 MED ORDER — ONDANSETRON HCL 4 MG PO TABS: 4.0000 mg | ORAL_TABLET | Freq: Four times a day (QID) | ORAL | Status: DC | PRN

## 2015-03-13 MED ORDER — OXYCODONE HCL 5 MG PO TABS
5.0000 mg | ORAL_TABLET | ORAL | Status: DC | PRN
  Administered 2015-03-13 (×2): 5 mg via ORAL
  Administered 2015-03-13 – 2015-03-16 (×16): 10 mg via ORAL
  Filled 2015-03-13 (×10): qty 2
  Filled 2015-03-13: qty 1
  Filled 2015-03-13 (×2): qty 2
  Filled 2015-03-13: qty 1
  Filled 2015-03-13 (×4): qty 2

## 2015-03-13 MED ORDER — LACTATED RINGERS IV SOLN
INTRAVENOUS | Status: DC | PRN
  Administered 2015-03-13 (×2): via INTRAVENOUS

## 2015-03-13 MED ORDER — ONDANSETRON HCL 4 MG/2ML IJ SOLN: 4.0000 mg | Freq: Four times a day (QID) | INTRAMUSCULAR | Status: DC | PRN

## 2015-03-13 MED ORDER — ALUM & MAG HYDROXIDE-SIMETH 200-200-20 MG/5ML PO SUSP: 30.0000 mL | ORAL | Status: DC | PRN

## 2015-03-13 MED ORDER — FENTANYL CITRATE (PF) 100 MCG/2ML IJ SOLN
INTRAMUSCULAR | Status: AC
  Filled 2015-03-13: qty 2

## 2015-03-13 MED ORDER — PHENYLEPHRINE HCL 10 MG/ML IJ SOLN
10.0000 mg | INTRAVENOUS | Status: DC | PRN
  Administered 2015-03-13: 20 ug/min via INTRAVENOUS

## 2015-03-13 MED ORDER — SODIUM CHLORIDE 0.9 % IV SOLN
INTRAVENOUS | Status: DC
Start: 2015-03-13 — End: 2015-03-16
  Administered 2015-03-13: via INTRAVENOUS

## 2015-03-13 MED ORDER — BUPIVACAINE-EPINEPHRINE (PF) 0.5% -1:200000 IJ SOLN
INTRAMUSCULAR | Status: DC | PRN
  Administered 2015-03-13: 30 mL via PERINEURAL

## 2015-03-13 MED ORDER — 0.9 % SODIUM CHLORIDE (POUR BTL) OPTIME
TOPICAL | Status: DC | PRN
  Administered 2015-03-13: 1000 mL

## 2015-03-13 MED ORDER — FLEET ENEMA 7-19 GM/118ML RE ENEM: 1.0000 | ENEMA | Freq: Once | RECTAL | Status: DC | PRN

## 2015-03-13 MED ORDER — ONDANSETRON HCL 4 MG/2ML IJ SOLN
INTRAMUSCULAR | Status: DC | PRN
  Administered 2015-03-13: 4 mg via INTRAVENOUS

## 2015-03-13 MED ORDER — DIPHENHYDRAMINE HCL 12.5 MG/5ML PO ELIX: 12.5000 mg | ORAL_SOLUTION | ORAL | Status: DC | PRN

## 2015-03-13 MED ORDER — ACETAMINOPHEN 325 MG PO TABS
650.0000 mg | ORAL_TABLET | Freq: Four times a day (QID) | ORAL | Status: DC | PRN
  Administered 2015-03-15: 650 mg via ORAL
  Filled 2015-03-13: qty 2

## 2015-03-13 MED ORDER — DOCUSATE SODIUM 100 MG PO CAPS
100.0000 mg | ORAL_CAPSULE | Freq: Two times a day (BID) | ORAL | Status: DC
  Administered 2015-03-13 – 2015-03-16 (×6): 100 mg via ORAL
  Filled 2015-03-13 (×6): qty 1

## 2015-03-13 MED ORDER — METOCLOPRAMIDE HCL 5 MG PO TABS: 5.0000 mg | ORAL_TABLET | Freq: Three times a day (TID) | ORAL | Status: DC | PRN

## 2015-03-13 MED ORDER — PNEUMOCOCCAL VAC POLYVALENT 25 MCG/0.5ML IJ INJ
0.5000 mL | INJECTION | INTRAMUSCULAR | Status: AC
  Administered 2015-03-14: 0.5 mL via INTRAMUSCULAR
  Filled 2015-03-13: qty 0.5

## 2015-03-13 MED ORDER — BISACODYL 10 MG RE SUPP: 10.0000 mg | Freq: Every day | RECTAL | Status: DC | PRN

## 2015-03-13 MED ORDER — INFLUENZA VAC SPLIT QUAD 0.5 ML IM SUSY
0.5000 mL | PREFILLED_SYRINGE | INTRAMUSCULAR | Status: AC
  Administered 2015-03-14: 0.5 mL via INTRAMUSCULAR
  Filled 2015-03-13: qty 0.5

## 2015-03-13 MED ORDER — HYDROMORPHONE HCL 1 MG/ML IJ SOLN
0.5000 mg | INTRAMUSCULAR | Status: DC | PRN
  Administered 2015-03-13 – 2015-03-15 (×6): 0.5 mg via INTRAVENOUS
  Filled 2015-03-13 (×6): qty 1

## 2015-03-13 MED ORDER — MIDAZOLAM HCL 2 MG/2ML IJ SOLN
INTRAMUSCULAR | Status: AC
  Filled 2015-03-13: qty 2

## 2015-03-13 MED ORDER — PHENOL 1.4 % MT LIQD: 1.0000 | OROMUCOSAL | Status: DC | PRN

## 2015-03-13 MED ORDER — BUPIVACAINE IN DEXTROSE 0.75-8.25 % IT SOLN
INTRATHECAL | Status: DC | PRN
  Administered 2015-03-13: 15 mg via INTRATHECAL

## 2015-03-13 MED ORDER — BUPIVACAINE HCL (PF) 0.25 % IJ SOLN
INTRAMUSCULAR | Status: AC
  Filled 2015-03-13: qty 30

## 2015-03-13 MED ORDER — KETOROLAC TROMETHAMINE 15 MG/ML IJ SOLN
7.5000 mg | Freq: Four times a day (QID) | INTRAMUSCULAR | Status: AC
  Administered 2015-03-13 – 2015-03-14 (×4): 7.5 mg via INTRAVENOUS
  Filled 2015-03-13 (×4): qty 1

## 2015-03-13 MED ORDER — HYDROMORPHONE HCL 1 MG/ML IJ SOLN: 0.2500 mg | INTRAMUSCULAR | Status: DC | PRN

## 2015-03-13 MED ORDER — PHENYLEPHRINE HCL 10 MG/ML IJ SOLN
INTRAMUSCULAR | Status: DC | PRN
  Administered 2015-03-13 (×3): 80 ug via INTRAVENOUS
  Administered 2015-03-13 (×2): 40 ug via INTRAVENOUS
  Administered 2015-03-13: 80 ug via INTRAVENOUS

## 2015-03-13 SURGICAL SUPPLY — 72 items
BANDAGE ELASTIC 4 VELCRO ST LF (GAUZE/BANDAGES/DRESSINGS) IMPLANT
BANDAGE ESMARK 6X9 LF (GAUZE/BANDAGES/DRESSINGS) IMPLANT
BENZOIN TINCTURE PRP APPL 2/3 (GAUZE/BANDAGES/DRESSINGS) ×2 IMPLANT
BLADE SAGITTAL 25.0X1.19X90 (BLADE) ×2 IMPLANT
BLADE SAW SGTL 13X75X1.27 (BLADE) ×2 IMPLANT
BNDG ELASTIC 6X10 VLCR STRL LF (GAUZE/BANDAGES/DRESSINGS) IMPLANT
BNDG ELASTIC 6X15 VLCR STRL LF (GAUZE/BANDAGES/DRESSINGS) ×2 IMPLANT
BNDG ESMARK 6X9 LF (GAUZE/BANDAGES/DRESSINGS)
BOWL SMART MIX CTS (DISPOSABLE) ×2 IMPLANT
CAP KNEE TOTAL 3 SIGMA ×2 IMPLANT
CEMENT HV SMART SET (Cement) ×2 IMPLANT
CLSR STERI-STRIP ANTIMIC 1/2X4 (GAUZE/BANDAGES/DRESSINGS) IMPLANT
COVER SURGICAL LIGHT HANDLE (MISCELLANEOUS) ×2 IMPLANT
CUFF TOURNIQUET SINGLE 34IN LL (TOURNIQUET CUFF) ×2 IMPLANT
CUFF TOURNIQUET SINGLE 44IN (TOURNIQUET CUFF) IMPLANT
DRAPE ORTHO SPLIT 77X108 STRL (DRAPES) ×2
DRAPE SURG ORHT 6 SPLT 77X108 (DRAPES) ×2 IMPLANT
DRAPE U-SHAPE 47X51 STRL (DRAPES) ×2 IMPLANT
DRSG PAD ABDOMINAL 8X10 ST (GAUZE/BANDAGES/DRESSINGS) ×4 IMPLANT
DURAPREP 26ML APPLICATOR (WOUND CARE) ×2 IMPLANT
ELECT REM PT RETURN 9FT ADLT (ELECTROSURGICAL) ×2
ELECTRODE REM PT RTRN 9FT ADLT (ELECTROSURGICAL) ×1 IMPLANT
FACESHIELD WRAPAROUND (MASK) ×4 IMPLANT
GAUZE SPONGE 4X4 12PLY STRL (GAUZE/BANDAGES/DRESSINGS) IMPLANT
GAUZE XEROFORM 5X9 LF (GAUZE/BANDAGES/DRESSINGS) ×2 IMPLANT
GLOVE BIOGEL PI IND STRL 6 (GLOVE) ×1 IMPLANT
GLOVE BIOGEL PI IND STRL 7.0 (GLOVE) ×1 IMPLANT
GLOVE BIOGEL PI IND STRL 8 (GLOVE) ×1 IMPLANT
GLOVE BIOGEL PI INDICATOR 6 (GLOVE) ×1
GLOVE BIOGEL PI INDICATOR 7.0 (GLOVE) ×1
GLOVE BIOGEL PI INDICATOR 8 (GLOVE) ×1
GLOVE ECLIPSE 6.5 STRL STRAW (GLOVE) ×2 IMPLANT
GLOVE ECLIPSE 7.5 STRL STRAW (GLOVE) ×2 IMPLANT
GLOVE ORTHO TXT STRL SZ7.5 (GLOVE) ×2 IMPLANT
GOWN STRL REUS W/ TWL LRG LVL3 (GOWN DISPOSABLE) ×3 IMPLANT
GOWN STRL REUS W/ TWL XL LVL3 (GOWN DISPOSABLE) ×1 IMPLANT
GOWN STRL REUS W/TWL 2XL LVL3 (GOWN DISPOSABLE) ×2 IMPLANT
GOWN STRL REUS W/TWL LRG LVL3 (GOWN DISPOSABLE) ×3
GOWN STRL REUS W/TWL XL LVL3 (GOWN DISPOSABLE) ×1
HANDPIECE INTERPULSE COAX TIP (DISPOSABLE) ×1
IMMOBILIZER KNEE 22 UNIV (SOFTGOODS) ×2 IMPLANT
KIT BASIN OR (CUSTOM PROCEDURE TRAY) ×2 IMPLANT
KIT ROOM TURNOVER OR (KITS) ×2 IMPLANT
MANIFOLD NEPTUNE II (INSTRUMENTS) ×2 IMPLANT
MARKER SPHERE PSV REFLC THRD 5 (MARKER) ×6 IMPLANT
NEEDLE HYPO 25GX1X1/2 BEV (NEEDLE) ×2 IMPLANT
NS IRRIG 1000ML POUR BTL (IV SOLUTION) ×2 IMPLANT
PACK TOTAL JOINT (CUSTOM PROCEDURE TRAY) ×2 IMPLANT
PACK UNIVERSAL I (CUSTOM PROCEDURE TRAY) ×2 IMPLANT
PAD ARMBOARD 7.5X6 YLW CONV (MISCELLANEOUS) ×4 IMPLANT
PAD CAST 4YDX4 CTTN HI CHSV (CAST SUPPLIES) ×1 IMPLANT
PADDING CAST COTTON 4X4 STRL (CAST SUPPLIES) ×1
PADDING CAST COTTON 6X4 STRL (CAST SUPPLIES) ×2 IMPLANT
PIN SCHANZ 4MM 130MM (PIN) ×8 IMPLANT
SET HNDPC FAN SPRY TIP SCT (DISPOSABLE) ×1 IMPLANT
SPONGE GAUZE 4X4 12PLY STER LF (GAUZE/BANDAGES/DRESSINGS) ×2 IMPLANT
STAPLER VISISTAT 35W (STAPLE) IMPLANT
STRIP CLOSURE SKIN 1/2X4 (GAUZE/BANDAGES/DRESSINGS) ×4 IMPLANT
SUCTION FRAZIER TIP 10 FR DISP (SUCTIONS) ×2 IMPLANT
SUT VIC AB 0 CT1 27 (SUTURE) ×1
SUT VIC AB 0 CT1 27XBRD ANBCTR (SUTURE) ×1 IMPLANT
SUT VIC AB 1 CTX 36 (SUTURE) ×2
SUT VIC AB 1 CTX36XBRD ANBCTR (SUTURE) ×2 IMPLANT
SUT VIC AB 2-0 CT1 27 (SUTURE) ×2
SUT VIC AB 2-0 CT1 TAPERPNT 27 (SUTURE) ×2 IMPLANT
SUT VIC AB 3-0 X1 27 (SUTURE) IMPLANT
SUT VIC AB 4-0 PS2 27 (SUTURE) ×2 IMPLANT
SYR CONTROL 10ML LL (SYRINGE) ×2 IMPLANT
TOWEL OR 17X24 6PK STRL BLUE (TOWEL DISPOSABLE) ×2 IMPLANT
TOWEL OR 17X26 10 PK STRL BLUE (TOWEL DISPOSABLE) ×2 IMPLANT
TRAY CATH 16FR W/PLASTIC CATH (SET/KITS/TRAYS/PACK) ×2 IMPLANT
WATER STERILE IRR 1000ML POUR (IV SOLUTION) IMPLANT

## 2015-03-13 NOTE — Interval H&P Note (Signed)
History and Physical Interval Note:  03/13/2015 12:37 PM  Sandy Sutton  has presented today for surgery, with the diagnosis of Left Knee Osteoarthritis   The various methods of treatment have been discussed with the patient and family. After consideration of risks, benefits and other options for treatment, the patient has consented to  Procedure(s): COMPUTER ASSISTED TOTAL KNEE ARTHROPLASTY (Left) as a surgical intervention .  The patient's history has been reviewed, patient examined, no change in status, stable for surgery.  I have reviewed the patient's chart and labs.  Questions were answered to the patient's satisfaction.     YATES,MARK C

## 2015-03-13 NOTE — Anesthesia Postprocedure Evaluation (Signed)
  Anesthesia Post-op Note  Patient: Sandy Sutton  Procedure(s) Performed: Procedure(s): COMPUTER ASSISTED TOTAL KNEE ARTHROPLASTY; LEFT KNEE (Left)  Patient Location: PACU  Anesthesia Type: Spinal/MAC  Level of Consciousness: awake and alert   Airway and Oxygen Therapy: Patient Spontanous Breathing  Post-op Pain: Controlled  Post-op Assessment: Post-op Vital signs reviewed, Patient's Cardiovascular Status Stable and Respiratory Function Stable. Block receeding.  Post-op Vital Signs: Reviewed  Filed Vitals:   03/13/15 2128  BP: 101/60  Pulse: 84  Temp: 36.7 C  Resp: 17    Complications: No apparent anesthesia complications

## 2015-03-13 NOTE — Transfer of Care (Signed)
Immediate Anesthesia Transfer of Care Note  Patient: Sandy Sutton  Procedure(s) Performed: Procedure(s): COMPUTER ASSISTED TOTAL KNEE ARTHROPLASTY; LEFT KNEE (Left)  Patient Location: PACU  Anesthesia Type:MAC, Regional and Spinal  Level of Consciousness: awake, alert  and oriented  Airway & Oxygen Therapy: Patient Spontanous Breathing  Post-op Assessment: Report given to RN, Post -op Vital signs reviewed and stable and Patient moving all extremities X 4  Post vital signs: Reviewed and stable  Last Vitals:  Filed Vitals:   03/13/15 1215  BP: 109/49  Pulse:   Temp:   Resp:     Complications: No apparent anesthesia complications

## 2015-03-13 NOTE — Progress Notes (Signed)
Orthopedic Tech Progress Note Patient Details:  Sandy Sutton [birth year] [id number] Off cpm at 7:05 pm Patient ID: Sandy Sutton, female   DOB: 1955-06-20, 59 y.o.   MRN: [id number]   Sandy Sutton 03/13/2015, 7:06 PM

## 2015-03-13 NOTE — Progress Notes (Signed)
Orthopedic Tech Progress Note Patient Details:  Sandy Sutton February 02, 1956 [id number] On cpm at 9:00 pm 0-40 Patient ID: [name], female   DOB: [birth year], 59 y.o.   MRN: [id number]   [name] 03/13/2015, 8:57 PM

## 2015-03-13 NOTE — Progress Notes (Signed)
Pt has a spinal headache, per Dr Lorin Mercy pt is to lay flat for 24 hours, (Sm spinal Leak)

## 2015-03-13 NOTE — OR Nursing (Signed)
1500: In and out catheter done per Dr. Lorin Mercy order. Protocol followed for catheterization. # 16 Bard Latex free urethral catheter used. Obtained 600 ml clear yellow urine. Patient tolerated well

## 2015-03-13 NOTE — Anesthesia Preprocedure Evaluation (Addendum)
Anesthesia Evaluation  Patient identified by MRN, date of birth, ID band Patient awake    Reviewed: Allergy & Precautions, H&P , NPO status , Patient's Chart, lab work & pertinent test results  Airway Mallampati: II  TM Distance: >3 FB Neck ROM: Full    Dental no notable dental hx. (+) Teeth Intact, Dental Advisory Given   Pulmonary neg pulmonary ROS,    Pulmonary exam normal breath sounds clear to auscultation       Cardiovascular negative cardio ROS   Rhythm:Regular Rate:Normal     Neuro/Psych negative neurological ROS  negative psych ROS   GI/Hepatic Neg liver ROS, GERD  Controlled,  Endo/Other  negative endocrine ROS  Renal/GU negative Renal ROS  negative genitourinary   Musculoskeletal  (+) Arthritis , Osteoarthritis,    Abdominal   Peds  Hematology negative hematology ROS (+)   Anesthesia Other Findings   Reproductive/Obstetrics negative OB ROS                            Anesthesia Physical Anesthesia Plan  ASA: II  Anesthesia Plan: Regional and Spinal   Post-op Pain Management: MAC Combined w/ Regional for Post-op pain   Induction: Intravenous  Airway Management Planned: Simple Face Mask  Additional Equipment:   Intra-op Plan:   Post-operative Plan:   Informed Consent: I have reviewed the patients History and Physical, chart, labs and discussed the procedure including the risks, benefits and alternatives for the proposed anesthesia with the patient or authorized representative who has indicated his/her understanding and acceptance.   Dental advisory given  Plan Discussed with: CRNA and Surgeon  Anesthesia Plan Comments:         Anesthesia Quick Evaluation

## 2015-03-13 NOTE — Brief Op Note (Signed)
03/13/2015  2:56 PM  PATIENT:  Sandy Sutton  59 y.o. female  PRE-OPERATIVE DIAGNOSIS:  Left Knee Osteoarthritis   POST-OPERATIVE DIAGNOSIS:  Left Knee Osteoarthritis   PROCEDURE:  Procedure(s): COMPUTER ASSISTED TOTAL KNEE ARTHROPLASTY; LEFT KNEE (Left)  SURGEON:  Surgeon(s) and Role:    * [name], MD - Primary  PHYSICIAN ASSISTANT:   ASSISTANTS: RNFA   ANESTHESIA:   regional and spinal  EBL:  Total I/O In: 1300 [I.V.:1300] Out: 50 [Blood:50]  BLOOD ADMINISTERED:none  DRAINS: none   LOCAL MEDICATIONS USED:  MARCAINE     SPECIMEN:  No Specimen  DISPOSITION OF SPECIMEN:  N/A  COUNTS:  YES  TOURNIQUET:   Total Tourniquet Time Documented: Thigh (Left) - 68 minutes Total: Thigh (Left) - 68 minutes   DICTATION: .Viviann Spare Dictation  PLAN OF CARE: Admit to inpatient   PATIENT DISPOSITION:  PACU - hemodynamically stable.   Delay start of Pharmacological VTE agent (>24hrs) due to surgical blood loss or risk of bleeding: yes

## 2015-03-13 NOTE — Progress Notes (Signed)
Orthopedic Tech Progress Note Patient Details:  Sandy Sutton 03/18/1956 [id number] Applied CPM to LLE.  Applied OHF with trapeze to pt.'s bed. CPM Left Knee CPM Left Knee: On Left Knee Flexion (Degrees): 60 Left Knee Extension (Degrees): 0   Darrol Poke 03/13/2015, 4:10 PM

## 2015-03-13 NOTE — Op Note (Signed)
Test test  Preop diagnosis: Left knee osteoarthritis (primary arthritis, past history of meniscectomy)  Postop diagnosis: Same  Procedure: Left cemented total knee arthroplasty, computer assist. Depuy LCS 2.5 femur 2.5 tibia 10 mm spacer 33mm 3 peg poly-  Surgeon: Rodell Perna M.D.  Assistant: April Fulp RN FA  Tourniquet: 350 times less than 1 hour 10 minutes  Complications: None  Procedure: After induction block spinal anesthesia IV sedation prepping with DuraPrep after application of a proximal thigh tourniquet lateral post heel bump DuraPrep was used preoperative Ancef prophylaxis. Midline incision was made after wrapping the leg with Esmarch after timeout procedure. Superficial retinaculum was developed quad tendon was split between the medial one third lateral two thirds patella was everted 10 mm removed sized to 35 and 3 pegs were drilled. Spurs removed off the femur there was a grade 4 chondromalacia changes varus deformity of the knee computer pins are placed in the femur and in the mid-tibia through stab incision and computer Mauser generated 10 mm was resected off of the femur 9 off the tibia. Box cut sizing was 2.5 collateral ligaments both flexion-extension were balanced. Cut was 0.5 of valgus on the tibia sloped was neutral. Trials were inserted balanced range of motion was checked. Good stability full extension good medial lateral flexion balance and good balance and extension. Full extension. Trials removed pulse lavage preparation the cement vacuum mixing. Tibia was cemented first followed by femur placement of the Polly and then patello-held with the self-retaining clamp. Cement was hard at 15 minutes tourniquet was deflated hemostasis obtained in standard layer closure. Subcuticular closure on the skin. Prior to closure S Perla Marcaine total 40 mL mixed one-to-one with Marcaine without epinephrine quarter percent was mixed and injected into the skin subtendinous tissue capsule  muscle. Patient tolerated the procedure well after the skin closure tincture benzoin Steri-Strips 4 x 4's ABDs and web roll and Ace wrap and knee immobilizer. Signed Rodell Perna M.D.

## 2015-03-13 NOTE — Anesthesia Procedure Notes (Addendum)
Anesthesia Regional Block:  Adductor canal block  Pre-Anesthetic Checklist: ,, timeout performed, Correct Patient, Correct Site, Correct Laterality, Correct Procedure, Correct Position, site marked, Risks and benefits discussed, pre-op evaluation,  At surgeon's request and post-op pain management  Laterality: Left  Prep: Maximum Sterile Barrier Precautions used and chloraprep       Needles:  Injection technique: Single-shot  Needle Type: Echogenic Stimulator Needle     Needle Length: 9cm 9 cm Needle Gauge: 22 and 22 G    Additional Needles:  Procedures: ultrasound guided (picture in chart) Adductor canal block Narrative:  Start time: 03/13/2015 12:15 PM End time: 03/13/2015 12:25 PM Injection made incrementally with aspirations every 5 mL. Anesthesiologist: Roderic Palau  Additional Notes: 2% Lidocaine skin wheel.    Spinal Patient location during procedure: OR Start time: 03/13/2015 12:42 PM End time: 03/13/2015 12:48 PM Staffing Anesthesiologist: Roderic Palau Performed by: anesthesiologist  Preanesthetic Checklist Completed: patient identified, surgical consent, pre-op evaluation, timeout performed, IV checked, risks and benefits discussed and monitors and equipment checked Spinal Block Patient position: sitting Prep: DuraPrep Patient monitoring: cardiac monitor, continuous pulse ox and blood pressure Approach: midline Location: L3-4 Injection technique: single-shot Needle Needle type: Pencan  Needle gauge: 24 G Needle length: 9 cm Assessment Sensory level: T8 Additional Notes Functioning IV was confirmed and monitors were applied. Sterile prep and drape, including hand hygiene and sterile gloves were used. The patient was positioned and the spine was prepped. The skin was anesthetized with lidocaine.  Free flow of clear CSF was obtained prior to injecting local anesthetic into the CSF.  The spinal needle aspirated freely following injection.  The  needle was carefully withdrawn.  The patient tolerated the procedure well.   Procedure Name: MAC Date/Time: 03/13/2015 12:50 PM Performed by: Garrison Columbus T Pre-anesthesia Checklist: Patient identified, Emergency Drugs available, Suction available and Patient being monitored Patient Re-evaluated:Patient Re-evaluated prior to inductionOxygen Delivery Method: Simple face mask Preoxygenation: Pre-oxygenation with 100% oxygen Intubation Type: IV induction Placement Confirmation: positive ETCO2 and breath sounds checked- equal and bilateral Dental Injury: Teeth and Oropharynx as per pre-operative assessment

## 2015-03-13 NOTE — H&P (Signed)
TOTAL KNEE ADMISSION H&P  Patient is being admitted for left total knee arthroplasty. She has Osteoarthritis of left knee.   Subjective:  Chief Complaint:left knee pain.  HPI: Sandy Sutton, 59 y.o. female, has a history of pain and functional disability in the left knee due to arthritis and has failed non-surgical conservative treatments for greater than 12 weeks to includeNSAID's and/or analgesics, corticosteriod injections and use of assistive devices.  Onset of symptoms was gradual, starting 5 years ago with gradually worsening course since that time. The patient noted no past surgery on the left knee(s).  Patient currently rates pain in the left knee(s) at 5 out of 10 with activity. Patient has night pain, worsening of pain with activity and weight bearing, pain that interferes with activities of daily living, pain with passive range of motion, crepitus and joint swelling.  Patient has evidence of subchondral cysts, subchondral sclerosis, periarticular osteophytes and joint space narrowing by imaging studies. This patient has had failure of conservative tx. There is no active infection.  Patient Active Problem List   Diagnosis Date Noted  . Thyroid nodule, cold 12/08/2010   Past Medical History  Diagnosis Date  . Arthritis     hands and feet  . Pneumonia     hx of-at least 58yrs ago  . History of bronchitis 2015  . History of migraine   . Weakness     right hand and pt states from pinched nerve  . Joint pain   . Joint swelling     bil knee and wrist  . Gout     hx of  . GERD (gastroesophageal reflux disease)     doesn't take any meds  . History of gastric ulcer   . History of colon polyps benign  . Urinary urgency   . Urinary frequency     Past Surgical History  Procedure Laterality Date  . Tubal ligation  1995  . Thyroid lobectomy Right 08/19/2012    Procedure: THYROID LOBECTOMY;  Surgeon: [name], MD;  Location: [facility];  Service: General;  Laterality: Right;  .  Knee arthroscopy Left 2015  . Esophagogastroduodenoscopy    . Colonosscopy      Prescriptions prior to admission  Medication Sig Dispense Refill Last Dose  . aspirin 81 MG tablet Take 81 mg by mouth daily.   Past Month at Unknown time  . Multiple Vitamins-Minerals (CENTRUM SILVER ADULT 50+) TABS Take 1 tablet by mouth daily.   Past Month at Unknown time  . OVER THE COUNTER MEDICATION Take 1 tablet by mouth 2 (two) times daily. Slim Quick Pure   Past Month at Unknown time  . OVER THE COUNTER MEDICATION Take 1 tablet by mouth daily. Super Citrus Max   Past Month at Unknown time   Allergies  Allergen Reactions  . Darvon Other (See Comments)    Hallucinations  . Erythromycin Nausea And Vomiting and Rash  . Hydrocodone Nausea And Vomiting    Social History  Substance Use Topics  . Smoking status: Never Smoker   . Smokeless tobacco: Never Used  . Alcohol Use: Yes     Comment: rare    Family History  Problem Relation Age of Onset  . Cancer Mother   . Alzheimer's disease Father   . Hypertension Sister   . Breast cancer Sister   . Lupus Sister   . Cancer Brother      Review of Systems  Constitutional: Negative.   HENT: Negative.   Eyes: Negative  for blurred vision and photophobia.  Respiratory: Negative.   Gastrointestinal: Negative.   Genitourinary: Negative.   Musculoskeletal: Positive for myalgias and joint pain.  Skin: Negative.   Neurological: Negative.   Endo/Heme/Allergies:       Tyroid nodule  Psychiatric/Behavioral: Negative.     Objective:  Physical Exam  Constitutional: She appears well-developed and well-nourished. No distress.  HENT:  Head: Normocephalic.  Eyes: Pupils are equal, round, and reactive to light.  Neck: Normal range of motion. Neck supple. No tracheal deviation present. No thyromegaly present.  Cardiovascular: Normal rate and regular rhythm.   Respiratory: Effort normal and breath sounds normal.  GI: Soft.  Musculoskeletal:  Left knee  mild varus, 0 to 110 ROM. Pulses normal hip exam normal ROM no pain. Sciatic function intact  Skin: She is not diaphoretic.  Psychiatric: She has a normal mood and affect. Her behavior is normal. Judgment and thought content normal.    Vital signs in last 24 hours: Temp:  [98.1 F (36.7 C)] 98.1 F (36.7 C) (09/28 1101) Pulse Rate:  [105] 105 (09/28 1101) Resp:  [18] 18 (09/28 1101) BP: (109-125)/(49-67) 109/49 mmHg (09/28 1215) SpO2:  [98 %] 98 % (09/28 1101) Weight:  [79.788 kg (175 lb 14.4 oz)] 79.788 kg (175 lb 14.4 oz) (09/28 1101)  Labs:   Estimated body mass index is 31.17 kg/(m^2) as calculated from the following:   Height as of this encounter: 5\' 3"  (1.6 m).   Weight as of this encounter: 79.788 kg (175 lb 14.4 oz).   Imaging Review Plain radiographs demonstrate moderate degenerative joint disease of the left knee(s). The overall alignment ismild varus. The bone quality appears to be good for age and reported activity level.  Assessment/Plan:  End stage arthritis, left knee   The patient history, physical examination, clinical judgment of the provider and imaging studies are consistent with end stage degenerative joint disease of the left knee(s) and total knee arthroplasty is deemed medically necessary. The treatment options including medical management, injection therapy arthroscopy and arthroplasty were discussed at length. The risks and benefits of total knee arthroplasty were presented and reviewed. The risks due to aseptic loosening, infection, stiffness, patella tracking problems, thromboembolic complications and other imponderables were discussed. The patient acknowledged the explanation, agreed to proceed with the plan and consent was signed. Patient is being admitted for inpatient treatment for surgery, pain control, PT, OT, prophylactic antibiotics, VTE prophylaxis, progressive ambulation and ADL's and discharge planning. The patient is planning to be discharged home  with home health services

## 2015-03-14 ENCOUNTER — Encounter (HOSPITAL_COMMUNITY): Payer: Self-pay | Admitting: Orthopaedic Surgery

## 2015-03-14 LAB — CBC
HCT: 31.2 % — ABNORMAL LOW (ref 36.0–46.0)
Hemoglobin: 9.9 g/dL — ABNORMAL LOW (ref 12.0–15.0)
MCH: 29 pg (ref 26.0–34.0)
MCHC: 31.7 g/dL (ref 30.0–36.0)
MCV: 91.5 fL (ref 78.0–100.0)
Platelets: 240 10*3/uL (ref 150–400)
RBC: 3.41 MIL/uL — ABNORMAL LOW (ref 3.87–5.11)
RDW: 12.8 % (ref 11.5–15.5)
WBC: 8.9 10*3/uL (ref 4.0–10.5)

## 2015-03-14 NOTE — Evaluation (Signed)
Physical Therapy Evaluation Patient Details Name: Sandy Sutton MRN: [id number] DOB: April 04, 1956 Today's Date: 03/14/2015   History of Present Illness  Lt TKA  Clinical Impression  Pt is s/p TKA resulting in the deficits listed below (see PT Problem List). Pt will benefit from skilled PT to increase their independence and safety with mobility to allow discharge to home with family support. Patient cleared to attempt sitting and mobility today. Patient reporting feeling dizzy with standing and fatiguing quickly, unable to attempt ambulation safely at this time. Will continue to progress mobility as tolerated for increased independence.       Follow Up Recommendations Home health PT;Supervision for mobility/OOB    Equipment Recommendations  None recommended by PT;Other (comment) (patient reports having rw at home)    Recommendations for Other Services       Precautions / Restrictions Precautions Precautions: Knee;Fall Precaution Booklet Issued: No Required Braces or Orthoses: Knee Immobilizer - Left Knee Immobilizer - Left: On when out of bed or walking Restrictions Weight Bearing Restrictions: Yes LLE Weight Bearing: Weight bearing as tolerated      Mobility  Bed Mobility Overal bed mobility: Needs Assistance Bed Mobility: Supine to Sit     Supine to sit: Min guard (bed level, using rail)     General bed mobility comments: first time up since surgery  Transfers Overall transfer level: Needs assistance Equipment used: Rolling walker (2 wheeled) Transfers: Sit to/from Stand Sit to Stand: Min assist         General transfer comment: reports mild dizziness with transfer, improving with sitting. Denies any headache  Ambulation/Gait Ambulation/Gait assistance: Min guard Ambulation Distance (Feet): 2 Feet Assistive device: Rolling walker (2 wheeled) Gait Pattern/deviations: Step-to pattern Gait velocity: decreased   General Gait Details: reporting pain with  weightbearing on LLE.   Stairs            Wheelchair Mobility    Modified Rankin (Stroke Patients Only)       Balance Overall balance assessment: Needs assistance Sitting-balance support: No upper extremity supported Sitting balance-Leahy Scale: Fair     Standing balance support: Bilateral upper extremity supported Standing balance-Leahy Scale: Poor Standing balance comment: using rw                             Pertinent Vitals/Pain Pain Assessment: 0-10 Pain Score: 8  Pain Location: Lt knee Pain Descriptors / Indicators: Aching Pain Intervention(s): Limited activity within patient's tolerance;Monitored during session    Home Living Family/patient expects to be discharged to:: Private residence Living Arrangements: Alone Available Help at Discharge: Family;Other (Comment) (sister to stay with) Type of Home: House Home Access: Stairs to enter Entrance Stairs-Rails: None Entrance Stairs-Number of Steps: 2 Home Layout: Multi-level Home Equipment: Walker - 2 wheels;Cane - single point      Prior Function Level of Independence: Independent               Hand Dominance        Extremity/Trunk Assessment               Lower Extremity Assessment: LLE deficits/detail   LLE Deficits / Details: fair quad activation     Communication   Communication: No difficulties  Cognition Arousal/Alertness: Awake/alert Behavior During Therapy: WFL for tasks assessed/performed Overall Cognitive Status: Within Functional Limits for tasks assessed  General Comments      Exercises Total Joint Exercises Ankle Circles/Pumps: AROM;Both;10 reps Quad Sets: Left;10 reps;Strengthening Heel Slides: Left;10 reps;AAROM Goniometric ROM: 58 degrees flexion      Assessment/Plan    PT Assessment Patient needs continued PT services  PT Diagnosis Difficulty walking;Acute pain;Generalized weakness   PT Problem List Decreased  strength;Decreased range of motion;Decreased activity tolerance;Decreased balance;Decreased mobility;Pain  PT Treatment Interventions DME instruction;Gait training;Stair training;Functional mobility training;Therapeutic activities;Therapeutic exercise;Patient/family education   PT Goals (Current goals can be found in the Care Plan section) Acute Rehab PT Goals Patient Stated Goal: be able to home home with less pain PT Goal Formulation: With patient Time For Goal Achievement: 03/28/15 Potential to Achieve Goals: Good    Frequency 7X/week   Barriers to discharge        Co-evaluation               End of Session Equipment Utilized During Treatment: Gait belt;Left knee immobilizer Activity Tolerance: Patient limited by fatigue;Other (comment) (reporting feeling dizzy with standing. ) Patient left: in chair;with call bell/phone within reach;with family/visitor present Nurse Communication: Mobility status         Time: 1235-1305 PT Time Calculation (min) (ACUTE ONLY): 30 min   Charges:   PT Evaluation $Initial PT Evaluation Tier I: 1 Procedure PT Treatments $Therapeutic Activity: 8-22 mins   PT G Codes:        Cassell Clement, PT, CSCS Pager [phone] Office [phone]  03/14/2015, 2:31 PM

## 2015-03-14 NOTE — Progress Notes (Signed)
Utilization review completed.  

## 2015-03-14 NOTE — Evaluation (Signed)
Occupational Therapy Evaluation Patient Details Name: Sandy Sutton MRN: [id number] DOB: September 16, 1955 Today's Date: 03/14/2015    History of Present Illness Lt TKA   Clinical Impression   Pt reports she was independent with ADLs PTA. Educated and demonstrated compensatory strategies for LB ADLs and walk in shower transfer technique. Plan to practice toilet transfer and LB ADLs next session. Pt plan to d/c home with 24/7 supervision from her sister for a short period of time. Pt would benefit from continued skilled OT in order to maximize independence with LB ADLs and functional transfers required to safely d/c home.    Follow Up Recommendations  No OT follow up;Supervision - Intermittent    Equipment Recommendations  3 in 1 bedside comode    Recommendations for Other Services       Precautions / Restrictions Precautions Precautions: Knee;Fall Precaution Booklet Issued: No Required Braces or Orthoses: Knee Immobilizer - Left Knee Immobilizer - Left: On when out of bed or walking Restrictions Weight Bearing Restrictions: Yes LLE Weight Bearing: Weight bearing as tolerated      Mobility Bed Mobility Overal bed mobility: Needs Assistance Bed Mobility: Supine to Sit     Supine to sit: Min guard (bed level, using rail)     General bed mobility comments: Pt in recliner, returned to recliner at end of session  Transfers Overall transfer level: Needs assistance Equipment used: Rolling walker (2 wheeled) Transfers: Sit to/from Stand Sit to Stand: Min guard         General transfer comment: dizziness and nausea with sit <> stand transfer. Improved with sitting    Balance Overall balance assessment: Needs assistance Sitting-balance support: No upper extremity supported Sitting balance-Leahy Scale: Fair     Standing balance support: Bilateral upper extremity supported Standing balance-Leahy Scale: Poor Standing balance comment: RW for support                              ADL Overall ADL's : Needs assistance/impaired Eating/Feeding: Set up;Sitting   Grooming: Set up;Sitting   Upper Body Bathing: Supervision/ safety;Sitting   Lower Body Bathing: Minimal assistance;Sit to/from stand   Upper Body Dressing : Supervision/safety;Sitting   Lower Body Dressing: Minimal assistance;Sit to/from stand   Toilet Transfer: Min guard;Ambulation;BSC;RW (BSC over toilet)   Toileting- Clothing Manipulation and Hygiene: Min guard;Sit to/from stand   Tub/ Shower Transfer: Min guard;Ambulation;Shower seat;Rolling walker   Functional mobility during ADLs: Min guard General ADL Comments: Family present during OT eval. Educated and demonstrated compensatory strategies for LB ADLs, walk in shower transfer technique, and edema management. Pt verbalized understanding.      Vision     Perception     Praxis      Pertinent Vitals/Pain Pain Assessment: 0-10 Pain Score: 9  Pain Location: L knee Pain Descriptors / Indicators: Aching;Sore Pain Intervention(s): Limited activity within patient's tolerance;Monitored during session;Repositioned;RN gave pain meds during session;Ice applied     Hand Dominance     Extremity/Trunk Assessment Upper Extremity Assessment Upper Extremity Assessment: Overall WFL for tasks assessed   Lower Extremity Assessment Lower Extremity Assessment: Defer to PT evaluation LLE Deficits / Details: fair quad activation   Cervical / Trunk Assessment Cervical / Trunk Assessment: Normal   Communication Communication Communication: No difficulties   Cognition Arousal/Alertness: Lethargic;Suspect due to medications Behavior During Therapy: [facility] for tasks assessed/performed Overall Cognitive Status: Within Functional Limits for tasks assessed  General Comments       Exercises Exercises: Total Joint     Shoulder Instructions      Home Living Family/patient expects to be discharged to::  Private residence Living Arrangements: Alone Available Help at Discharge: Family;Other (Comment) (sister coming to stay) Type of Home: House Home Access: Stairs to enter [employer] of Steps: 2 Entrance Stairs-Rails: None Home Layout: Multi-level Alternate Level Stairs-Number of Steps: 12   Bathroom Shower/Tub: Occupational psychologist: Standard Bathroom Accessibility: Yes How Accessible: Accessible via walker Home Equipment: Vera - 2 wheels;Cane - single point;Shower seat - built in          Prior Functioning/Environment Level of Independence: Independent             OT Diagnosis: Generalized weakness;Acute pain   OT Problem List: Decreased strength;Decreased activity tolerance;Impaired balance (sitting and/or standing);Decreased safety awareness;Decreased knowledge of use of DME or AE;Decreased knowledge of precautions;Pain   OT Treatment/Interventions: Self-care/ADL training;DME and/or AE instruction;Patient/family education    OT Goals(Current goals can be found in the care plan section) Acute Rehab OT Goals Patient Stated Goal: be able to home home with less pain OT Goal Formulation: With patient Time For Goal Achievement: 03/28/15 Potential to Achieve Goals: Good ADL Goals Pt Will Perform Grooming: with modified independence;standing Pt Will Perform Lower Body Bathing: with supervision;sit to/from stand Pt Will Perform Lower Body Dressing: with supervision;sit to/from stand Pt Will Transfer to Toilet: with modified independence;ambulating;bedside commode (BSC over toilet ) Pt Will Perform Tub/Shower Transfer: with supervision;ambulating;shower seat;rolling walker  OT Frequency: Min 2X/week   Barriers to D/C:            Co-evaluation              End of Session Equipment Utilized During Treatment: Rolling walker;Gait belt;Left knee immobilizer CPM Left Knee CPM Left Knee: Off Additional Comments: 1240  Activity Tolerance:  Patient tolerated treatment well Patient left: in chair;with call bell/phone within reach;with family/visitor present   Time: 8657-8469 OT Time Calculation (min): 21 min Charges:  OT General Charges $OT Visit: 1 Procedure OT Evaluation $Initial OT Evaluation Tier I: 1 Procedure G-Codes:    Binnie Kand M.S., OTR/L Pager: [phone]  03/14/2015, 2:57 PM

## 2015-03-14 NOTE — Progress Notes (Signed)
Subjective: 1 Day Post-Op Procedure(s) (LRB): COMPUTER ASSISTED TOTAL KNEE ARTHROPLASTY; LEFT KNEE (Left) Patient reports pain as moderate.  Pain got worse at 6 PM and now is better.   Objective: Vital signs in last 24 hours: Temp:  [96.5 F (35.8 C)-99.6 F (37.6 C)] 99.6 F (37.6 C) (09/29 0616) Pulse Rate:  [71-105] 91 (09/29 0616) Resp:  [10-19] 18 (09/29 0616) BP: (93-125)/(49-67) 94/66 mmHg (09/29 0616) SpO2:  [90 %-100 %] 90 % (09/29 0616) Weight:  [79.788 kg (175 lb 14.4 oz)] 79.788 kg (175 lb 14.4 oz) (09/28 1101)  Intake/Output from previous day: 09/28 0701 - 09/29 0700 In: 1300 [I.V.:1300] Out: 950 [Urine:900; Blood:50] Intake/Output this shift:     Recent Labs  03/14/15 0339  HGB 9.9*    Recent Labs  03/14/15 0339  WBC 8.9  RBC 3.41*  HCT 31.2*  PLT 240   No results for input(s): NA, K, CL, CO2, BUN, CREATININE, GLUCOSE, CALCIUM in the last 72 hours. No results for input(s): LABPT, INR in the last 72 hours.  Neurologically intact  Assessment/Plan: 1 Day Post-Op Procedure(s) (LRB): COMPUTER ASSISTED TOTAL KNEE ARTHROPLASTY; LEFT KNEE (Left) Up with therapy   NO HEADACHE  OK NOW TO START THERAPY.   YATES,MARK C 03/14/2015, 7:46 AM

## 2015-03-15 LAB — CBC
HCT: 31.2 % — ABNORMAL LOW (ref 36.0–46.0)
Hemoglobin: 10.2 g/dL — ABNORMAL LOW (ref 12.0–15.0)
MCH: 29.9 pg (ref 26.0–34.0)
MCHC: 32.7 g/dL (ref 30.0–36.0)
MCV: 91.5 fL (ref 78.0–100.0)
Platelets: 233 10*3/uL (ref 150–400)
RBC: 3.41 MIL/uL — ABNORMAL LOW (ref 3.87–5.11)
RDW: 12.8 % (ref 11.5–15.5)
WBC: 13.3 10*3/uL — ABNORMAL HIGH (ref 4.0–10.5)

## 2015-03-15 MED ORDER — ASPIRIN 325 MG PO TBEC: 325.0000 mg | DELAYED_RELEASE_TABLET | Freq: Every day | ORAL | Status: DC

## 2015-03-15 MED ORDER — METHOCARBAMOL 500 MG PO TABS: 500.0000 mg | ORAL_TABLET | Freq: Four times a day (QID) | ORAL | Status: DC | PRN

## 2015-03-15 MED ORDER — OXYCODONE-ACETAMINOPHEN 7.5-325 MG PO TABS: 1.0000 | ORAL_TABLET | ORAL | Status: DC | PRN

## 2015-03-15 NOTE — Progress Notes (Signed)
Occupational Therapy Treatment Patient Details Name: Sandy Sutton MRN: [id number] DOB: [birth year] Today's Date: 03/15/2015    History of present illness Lt TKA   OT comments  Pt progressing slowly toward OT goals. Pt declined OOB activities today, reports she is too fatigued from PT session. Educated pt and sister, who will be her primary caregiver, on compensatory strategies for ADLs, transfer technique with RW, and use of 3 in 1. Pt ready to d/c home from an OT standpoint. Will continue to follow acutely.    Follow Up Recommendations  No OT follow up;Supervision - Intermittent    Equipment Recommendations  3 in 1 bedside comode    Recommendations for Other Services      Precautions / Restrictions Precautions Precautions: Knee;Fall Required Braces or Orthoses: Knee Immobilizer - Left Knee Immobilizer - Left: On when out of bed or walking Restrictions Weight Bearing Restrictions: Yes LLE Weight Bearing: Weight bearing as tolerated       Mobility Bed Mobility Overal bed mobility: Needs Assistance Bed Mobility: Sit to Supine       Sit to supine: Supervision   General bed mobility comments: encouragement for patient to maximize independence  Transfers Overall transfer level: Needs assistance Equipment used: Rolling walker (2 wheeled) Transfers: Sit to/from Stand Sit to Stand: Supervision         General transfer comment: no cues needed.     Balance Overall balance assessment: Needs assistance Sitting-balance support: No upper extremity supported Sitting balance-Leahy Scale: Good     Standing balance support: During functional activity Standing balance-Leahy Scale: Fair Standing balance comment: using rw                   ADL                                         General ADL Comments: Pt declined OOB activities, reports she is too fatigued from PT session. Educated pt and sister (caregiver upon d/c) on compensatory  strategies for ADLs, transfer technique with RW, use of 3 in 1. Pt and sister report they have no further questions or concerns       Vision                     Perception     Praxis      Cognition   Behavior During Therapy: [facility] for tasks assessed/performed Overall Cognitive Status: Within Functional Limits for tasks assessed                       Extremity/Trunk Assessment               Exercises Total Joint Exercises Ankle Circles/Pumps: AROM;Both;10 reps Quad Sets: Left;10 reps;Strengthening Heel Slides: Left;10 reps;AAROM Knee Flexion: AROM;Left;Seated;10 reps   Shoulder Instructions       General Comments      Pertinent Vitals/ Pain       Pain Assessment: 0-10 Pain Score: 6  Pain Location: L knee Pain Descriptors / Indicators: Aching;Sore Pain Intervention(s): Repositioned;Ice applied  Home Living                                          Prior Functioning/Environment  Frequency Min 2X/week     Progress Toward Goals  OT Goals(current goals can now be found in the care plan section)  Progress towards OT goals: Progressing toward goals  Acute Rehab OT Goals Patient Stated Goal: none stated   Plan Discharge plan remains appropriate    Co-evaluation                 End of Session     Activity Tolerance Patient limited by pain;Patient limited by fatigue   Patient Left in bed;with call bell/phone within reach;with family/visitor present   Nurse Communication Other (comment) (pt requesting CPM on)        Time: 0932-3557 OT Time Calculation (min): 17 min  Charges: OT General Charges $OT Visit: 1 Procedure OT Treatments $Self Care/Home Management : 8-22 mins  Binnie Kand M.S., OTR/L Pager: [phone]  03/15/2015, 4:13 PM

## 2015-03-15 NOTE — Progress Notes (Addendum)
Physical Therapy Treatment Patient Details Name: Sandy Sutton MRN: [id number] DOB: Aug 16, 1955 Today's Date: 03/15/2015    History of Present Illness Lt TKA    PT Comments    Patient able to increase her mobility today by ambulating 90 feet with min guard to supervision. Reviewed HEP at length with education on frequency as well as technique. Will continue to work on mobility with anticipated attempt of stairs during next session. Anticipate D/C will be to home with family assistance.    Follow Up Recommendations  Home health PT;Supervision for mobility/OOB     Equipment Recommendations  Rolling walker with 5" wheels    Recommendations for Other Services       Precautions / Restrictions Precautions Precautions: Knee;Fall Required Braces or Orthoses: Knee Immobilizer - Left Knee Immobilizer - Left: On when out of bed or walking Restrictions Weight Bearing Restrictions: Yes LLE Weight Bearing: Weight bearing as tolerated    Mobility  Bed Mobility               General bed mobility comments: found and returned to sitting  Transfers Overall transfer level: Needs assistance Equipment used: Rolling walker (2 wheeled) Transfers: Sit to/from Stand Sit to Stand: Min guard         General transfer comment: no loss of balance with standing, patient denies nausea or dizziness.   Ambulation/Gait Ambulation/Gait assistance: Min guard;Supervision Ambulation Distance (Feet): 90 Feet Assistive device: Rolling walker (2 wheeled) Gait Pattern/deviations: Step-to pattern;Decreased step length - right;Decreased weight shift to left Gait velocity: decreased   General Gait Details: encouraging weightbearing and even strides    Stairs            Wheelchair Mobility    Modified Rankin (Stroke Patients Only)       Balance Overall balance assessment: Needs assistance Sitting-balance support: No upper extremity supported Sitting balance-Leahy Scale: Good      Standing balance support: Bilateral upper extremity supported Standing balance-Leahy Scale: Poor Standing balance comment: using rw                    Cognition Arousal/Alertness: Awake/alert Behavior During Therapy: WFL for tasks assessed/performed Overall Cognitive Status: Within Functional Limits for tasks assessed                      Exercises Total Joint Exercises Ankle Circles/Pumps: AROM;Both;10 reps Quad Sets: Left;10 reps;Strengthening Short Arc Quad: Strengthening;Left;10 reps Heel Slides: Left;10 reps;AAROM Hip ABduction/ADduction: Strengthening;Left;10 reps Goniometric ROM: 54 degrees flexion    General Comments        Pertinent Vitals/Pain Pain Assessment: 0-10 Pain Score: 8  Pain Location: Lt knee Pain Descriptors / Indicators: Aching;Sore Pain Intervention(s): Limited activity within patient's tolerance;Monitored during session    Home Living                      Prior Function            PT Goals (current goals can now be found in the care plan section) Acute Rehab PT Goals Patient Stated Goal: be able to walk all the way down the hall PT Goal Formulation: With patient Time For Goal Achievement: 03/28/15 Potential to Achieve Goals: Good Progress towards PT goals: Progressing toward goals    Frequency  7X/week    PT Plan Current plan remains appropriate    Co-evaluation             End of Session Equipment Utilized During Treatment: Gait  belt;Left knee immobilizer Activity Tolerance: Patient limited by fatigue Patient left: in chair;with call bell/phone within reach     Time: 0825-0903 PT Time Calculation (min) (ACUTE ONLY): 38 min  Charges:  $Gait Training: 8-22 mins $Therapeutic Exercise: 23-37 mins                    G Codes:      Cassell Clement, PT, CSCS Pager [phone] Office [phone]  03/15/2015, 11:56 AM

## 2015-03-15 NOTE — Progress Notes (Signed)
Physical Therapy Treatment Patient Details Name: Sandy Sutton MRN: [id number] DOB: [birth year] Today's Date: 03/15/2015    History of Present Illness Lt TKA    PT Comments    Patient is making good progress with PT. Patient able to significantly improve her mobility today including gait, exercises and general mobility. From a mobility standpoint anticipate patient ultimately DC home with family assistance. Will continue to progress gait and mobility independence during further PT sessions.      Follow Up Recommendations  Home health PT;Supervision for mobility/OOB     Equipment Recommendations  Rolling walker with 5" wheels    Recommendations for Other Services       Precautions / Restrictions Precautions Precautions: Knee;Fall Required Braces or Orthoses: Knee Immobilizer - Left Knee Immobilizer - Left: On when out of bed or walking Restrictions Weight Bearing Restrictions: Yes LLE Weight Bearing: Weight bearing as tolerated    Mobility  Bed Mobility Overal bed mobility: Needs Assistance Bed Mobility: Sit to Supine       Sit to supine: Supervision   General bed mobility comments: encouragement for patient to maximize independence  Transfers Overall transfer level: Needs assistance Equipment used: Rolling walker (2 wheeled) Transfers: Sit to/from Stand Sit to Stand: Supervision         General transfer comment: no cues needed.   Ambulation/Gait Ambulation/Gait assistance: Supervision Ambulation Distance (Feet): 75 Feet Assistive device: Rolling walker (2 wheeled) Gait Pattern/deviations: Decreased step length - right;Decreased step length - left;Decreased stance time - left;Decreased weight shift to left Gait velocity: decreased   General Gait Details: encouraging weightbearing and even strides    Stairs Stairs: Yes Stairs assistance: Min guard Stair Management: No rails;Backwards;Forwards;With walker (up backwards, down forwards, sister present  and assisting) Number of Stairs: 2 General stair comments: patient denies questions or concerns.  Wheelchair Mobility    Modified Rankin (Stroke Patients Only)       Balance Overall balance assessment: Needs assistance Sitting-balance support: No upper extremity supported Sitting balance-Leahy Scale: Good     Standing balance support: During functional activity Standing balance-Leahy Scale: Fair Standing balance comment: using rw                    Cognition Arousal/Alertness: Awake/alert Behavior During Therapy: WFL for tasks assessed/performed Overall Cognitive Status: Within Functional Limits for tasks assessed                      Exercises Total Joint Exercises Ankle Circles/Pumps: AROM;Both;10 reps Quad Sets: Left;10 reps;Strengthening Heel Slides: Left;10 reps;AAROM Knee Flexion: AROM;Left;Seated;10 reps    General Comments        Pertinent Vitals/Pain Pain Assessment: 0-10 Pain Score: 7  Pain Location: lt knee Pain Descriptors / Indicators: Sharp;Aching Pain Intervention(s): Limited activity within patient's tolerance;Monitored during session    Home Living                      Prior Function            PT Goals (current goals can now be found in the care plan section) Acute Rehab PT Goals Patient Stated Goal: move better PT Goal Formulation: With patient Time For Goal Achievement: 03/28/15 Potential to Achieve Goals: Good Progress towards PT goals: Progressing toward goals    Frequency  7X/week    PT Plan Current plan remains appropriate    Co-evaluation             End of Session Equipment  Utilized During Treatment: Gait belt;Left knee immobilizer Activity Tolerance: Patient limited by fatigue Patient left: in bed;in CPM;with call bell/phone within reach;with family/visitor present     Time: 0919-8022 PT Time Calculation (min) (ACUTE ONLY): 43 min  Charges:  $Gait Training: 23-37 mins $Therapeutic  Exercise: 8-22 mins                    G Codes:      Cassell Clement, PT, CSCS Pager [phone] Office [phone]  03/15/2015, 3:40 PM

## 2015-03-15 NOTE — Care Management Note (Signed)
Case Management Note  Patient Details  Name: Sandy Sutton MRN: [id number] Date of Birth: 1956/06/11  Subjective/Objective:         S/p left total knee arthroplasty           Action/Plan: Gave patient [place] agencies list on 03/14/15, she decided on [facility]. Contacted Miranda at [facility] and set up HPT. Rolling walker and 3N1 delivered to patient's room by [facility]. Patient stated that she will have family availble to assist after discharge.   Expected Discharge Date:                  Expected Discharge Plan:  [place]  In-House Referral:  NA  Discharge planning Services  CM Consult  Post Acute Care Choice:  Durable Medical Equipment, Home Health Choice offered to:  Patient  DME Arranged:  3-N-1, Walker rolling DME Agency:  [place]:  PT [place]:  [place]  Status of Service:  Completed, signed off  Medicare Important Message Given:    Date Medicare IM Given:    Medicare IM give by:    Date Additional Medicare IM Given:    Additional Medicare Important Message give by:     If discussed at [place] of Stay Meetings, dates discussed:    Additional Comments:  Nila Nephew, RN 03/15/2015, 10:21 AM

## 2015-03-15 NOTE — Discharge Instructions (Signed)

## 2015-03-16 LAB — CBC
HCT: 30.2 % — ABNORMAL LOW (ref 36.0–46.0)
Hemoglobin: 9.7 g/dL — ABNORMAL LOW (ref 12.0–15.0)
MCH: 29.1 pg (ref 26.0–34.0)
MCHC: 32.1 g/dL (ref 30.0–36.0)
MCV: 90.7 fL (ref 78.0–100.0)
Platelets: 251 10*3/uL (ref 150–400)
RBC: 3.33 MIL/uL — ABNORMAL LOW (ref 3.87–5.11)
RDW: 12.6 % (ref 11.5–15.5)
WBC: 12.2 10*3/uL — ABNORMAL HIGH (ref 4.0–10.5)

## 2015-03-16 NOTE — Progress Notes (Signed)
Orthopedic Tech Progress Note Patient Details:  Sandy Sutton [birth year] [id number]  Patient ID: Sandy Sutton, female   DOB: 06-01-1956, 59 y.o.   MRN: [id number] Placed pt's lle on cpm @ 0-55 degrees ; WILL INCREASE AS PT TOLERATES; RN notified  Hildred Priest 03/16/2015, 2:05 PM

## 2015-03-16 NOTE — Progress Notes (Signed)
Physical Therapy Treatment Patient Details Name: Sandy Sutton MRN: [id number] DOB: 05-Oct-1955 Today's Date: 03/16/2015    History of Present Illness Pt is a 59 y/o female who presents s/p L TKA.    PT Comments    Pt progressing towards physical therapy goals. Was able to improve ambulation technique and negotiation of stairs. Reviewed HEP and positioned pt with towel roll and ice at end of session. Pt states she feels comfortable returning home with assist of friend.  Follow Up Recommendations  Home health PT;Supervision for mobility/OOB     Equipment Recommendations  Rolling walker with 5" wheels    Recommendations for Other Services       Precautions / Restrictions Precautions Precautions: Knee;Fall Precaution Comments: Discussed no pillow under knee and towel roll under heel Required Braces or Orthoses: Knee Immobilizer - Left Knee Immobilizer - Left: On when out of bed or walking Restrictions Weight Bearing Restrictions: Yes LLE Weight Bearing: Weight bearing as tolerated    Mobility  Bed Mobility               General bed mobility comments: Pt sitting up in recliner chair upon PT arrival.   Transfers Overall transfer level: Needs assistance Equipment used: Rolling walker (2 wheeled) Transfers: Sit to/from Stand Sit to Stand: Supervision         General transfer comment: Good hand placement and safety awareness.  Ambulation/Gait Ambulation/Gait assistance: Supervision Ambulation Distance (Feet): 125 Feet Assistive device: Rolling walker (2 wheeled) Gait Pattern/deviations: Step-to pattern;Step-through pattern;Decreased stride length;Trunk flexed Gait velocity: Decreased Gait velocity interpretation: Below normal speed for age/gender General Gait Details: Progressed to step-through gait pattern by end of gait training. VC's for increased heel strike and more fluid sequencing with the walker.    Stairs Stairs: Yes Stairs assistance: Min  guard Stair Management: No rails;Backwards;With walker Number of Stairs: 3 General stair comments: VC's for sequencing and safety awareness. Pt with mild knee buckling initially but no assist required to recover.   Wheelchair Mobility    Modified Rankin (Stroke Patients Only)       Balance Overall balance assessment: Needs assistance Sitting-balance support: Feet supported;No upper extremity supported Sitting balance-Leahy Scale: Good     Standing balance support: During functional activity;Bilateral upper extremity supported Standing balance-Leahy Scale: Fair Standing balance comment: Static - washing hands                    Cognition Arousal/Alertness: Awake/alert Behavior During Therapy: WFL for tasks assessed/performed Overall Cognitive Status: Within Functional Limits for tasks assessed                      Exercises Total Joint Exercises Hip ABduction/ADduction: 10 reps Long Arc Quad: 10 reps (RLE to assist in extension) Knee Flexion: 15 reps;Seated Goniometric ROM: 73 flexion seated    General Comments        Pertinent Vitals/Pain Pain Assessment: Faces Faces Pain Scale: Hurts little more Pain Location: L knee Pain Descriptors / Indicators: Operative site guarding Pain Intervention(s): Limited activity within patient's tolerance;Monitored during session;Repositioned    Home Living                      Prior Function            PT Goals (current goals can now be found in the care plan section) Acute Rehab PT Goals Patient Stated Goal: Home today PT Goal Formulation: With patient Time For Goal Achievement: 03/28/15 Potential  to Achieve Goals: Good Progress towards PT goals: Progressing toward goals    Frequency  7X/week    PT Plan Current plan remains appropriate    Co-evaluation             End of Session Equipment Utilized During Treatment: Gait belt Activity Tolerance: Patient tolerated treatment  well Patient left: in chair;with call bell/phone within reach     Time: 1124-1207 PT Time Calculation (min) (ACUTE ONLY): 43 min  Charges:  $Gait Training: 23-37 mins $Therapeutic Exercise: 8-22 mins                    G Codes:      Rolinda Roan 15-Apr-2015, 1:11 PM   Rolinda Roan, PT, DPT Acute Rehabilitation Services Pager: [phone]

## 2015-03-16 NOTE — Progress Notes (Signed)
Subjective: Pt stable - mobilizing well   Objective: Vital signs in last 24 hours: Temp:  [98.1 F (36.7 C)-98.8 F (37.1 C)] 98.1 F (36.7 C) (10/01 0539) Pulse Rate:  [86-93] 86 (10/01 0539) Resp:  [17-18] 17 (10/01 0539) BP: (97-100)/(50-56) 100/50 mmHg (10/01 0539) SpO2:  [94 %-97 %] 95 % (10/01 0539)  Intake/Output from previous day: 09/30 0701 - 10/01 0700 In: 720 [P.O.:720] Out: -  Intake/Output this shift:    Exam:  Compartment soft  Labs:  Recent Labs  03/14/15 0339 03/15/15 0400 03/16/15 0742  HGB 9.9* 10.2* 9.7*    Recent Labs  03/15/15 0400 03/16/15 0742  WBC 13.3* PENDING  RBC 3.41* 3.33*  HCT 31.2* 30.2*  PLT 233 251   No results for input(s): NA, K, CL, CO2, BUN, CREATININE, GLUCOSE, CALCIUM in the last 72 hours. No results for input(s): LABPT, INR in the last 72 hours.  Assessment/Plan: Plan dc am after more PT today   DEAN,GREGORY SCOTT 03/16/2015, 9:41 AM

## 2015-03-28 NOTE — Discharge Summary (Signed)
Patient ID: Koda Defrank MRN: [id number] DOB/AGE: [birth year] 59 y.o.  Admit date: 03/13/2015 Discharge date: 03/28/2015  Admission Diagnoses:  Active Problems:   Osteoarthritis of left knee   Discharge Diagnoses:  Active Problems:   Osteoarthritis of left knee  status post Procedure(s): COMPUTER ASSISTED TOTAL KNEE ARTHROPLASTY; LEFT KNEE  Past Medical History  Diagnosis Date  . Arthritis     hands and feet  . Pneumonia     hx of-at least 74yrs ago  . History of bronchitis 2015  . History of migraine   . Weakness     right hand and pt states from pinched nerve  . Joint pain   . Joint swelling     bil knee and wrist  . Gout     hx of  . GERD (gastroesophageal reflux disease)     doesn't take any meds  . History of gastric ulcer   . History of colon polyps benign  . Urinary urgency   . Urinary frequency     Surgeries: Procedure(s): COMPUTER ASSISTED TOTAL KNEE ARTHROPLASTY; LEFT KNEE on 03/13/2015   Consultants:    Discharged Condition: Improved  Hospital Course: Koby Hartfield is an 59 y.o. female who was admitted 03/13/2015 for operative treatment of left knee djd. Patient failed conservative treatments (please see the history and physical for the specifics) and had severe unremitting pain that affects sleep, daily activities and work/hobbies. After pre-op clearance, the patient was taken to the operating room on 03/13/2015 and underwent  Procedure(s): Ness; LEFT KNEE.    Patient was given perioperative antibiotics:  Anti-infectives    Start     Dose/Rate Route Frequency Ordered Stop   03/13/15 1200  ceFAZolin (ANCEF) IVPB 2 g/50 mL premix     2 g 100 mL/hr over 30 Minutes Intravenous To [facility]Stay [facility] 03/12/15 1001 03/13/15 1255       Patient was given sequential compression devices and early ambulation to prevent DVT.   Patient benefited maximally from hospital stay and there were no complications. At the  time of discharge, the patient was urinating/moving their bowels without difficulty, tolerating a regular diet, pain is controlled with oral pain medications and they have been cleared by PT/OT.   Recent vital signs: No data found.    Recent laboratory studies: No results for input(s): WBC, HGB, HCT, PLT, NA, K, CL, CO2, BUN, CREATININE, GLUCOSE, INR, CALCIUM in the last 72 hours.  Invalid input(s): PT, 2   Discharge Medications:     Medication List    STOP taking these medications        aspirin 81 MG tablet  Replaced by:  aspirin 325 MG EC tablet     CENTRUM SILVER ADULT 50+ Tabs     OVER THE COUNTER MEDICATION      TAKE these medications        aspirin 325 MG EC tablet  Take 1 tablet (325 mg total) by mouth daily.     methocarbamol 500 MG tablet  Commonly known as:  ROBAXIN  Take 1 tablet (500 mg total) by mouth every 6 (six) hours as needed for muscle spasms.     oxyCODONE-acetaminophen 7.5-325 MG tablet  Commonly known as:  PERCOCET  Take 1 tablet by mouth every 4 (four) hours as needed for severe pain.        Diagnostic Studies: Dg Chest 2 View  03/06/2015  CLINICAL DATA:  Preoperative evaluation for upcoming knee replacement EXAM: CHEST - 2  VIEW COMPARISON:  08/17/2012 FINDINGS: The heart size and mediastinal contours are within normal limits. Both lungs are clear. The visualized skeletal structures are unremarkable. IMPRESSION: No active disease. Electronically Signed   By: Inez Catalina M.D.   On: 03/06/2015 14:50        Discharge Instructions    Call MD / Call 911    Complete by:  As directed   If you experience chest pain or shortness of breath, CALL 911 and be transported to the hospital emergency room.  If you develope a fever above 101 F, pus (white drainage) or increased drainage or redness at the wound, or calf pain, call your surgeon's office.     Constipation Prevention    Complete by:  As directed   Drink plenty of fluids.  Prune juice may be  helpful.  You may use a stool softener, such as Colace (over the counter) 100 mg twice a day.  Use MiraLax (over the counter) for constipation as needed.     Diet - low sodium heart healthy    Complete by:  As directed      Driving restrictions    Complete by:  As directed   No driving     Increase activity slowly as tolerated    Complete by:  As directed      Lifting restrictions    Complete by:  As directed   No lifting           Follow-up Information    Follow up with [facility].   Why:  They will contact you to schedule home therapy visits.   Contact information:   [address] [place] [address] [phone]       Schedule an appointment as soon as possible for a visit with [name], MD.   Specialty:  Orthopedic Surgery   Why:  need return office visit 2 weeks postop   Contact information:   [place] [address] [phone]       Discharge Plan:  discharge to home  Disposition:     Signed: Lanae Crumbly  03/28/2015, 9:24 AM

## 2015-04-01 ENCOUNTER — Ambulatory Visit: Payer: BLUE CROSS/BLUE SHIELD | Attending: Orthopaedic Surgery

## 2015-04-01 DIAGNOSIS — M25562 Pain in left knee: Secondary | ICD-10-CM | POA: Insufficient documentation

## 2015-04-01 DIAGNOSIS — M25662 Stiffness of left knee, not elsewhere classified: Secondary | ICD-10-CM | POA: Diagnosis not present

## 2015-04-01 DIAGNOSIS — R6889 Other general symptoms and signs: Secondary | ICD-10-CM | POA: Diagnosis present

## 2015-04-01 DIAGNOSIS — R262 Difficulty in walking, not elsewhere classified: Secondary | ICD-10-CM | POA: Diagnosis present

## 2015-04-01 DIAGNOSIS — R600 Localized edema: Secondary | ICD-10-CM | POA: Diagnosis present

## 2015-04-01 DIAGNOSIS — R29898 Other symptoms and signs involving the musculoskeletal system: Secondary | ICD-10-CM | POA: Diagnosis present

## 2015-04-01 NOTE — Therapy (Signed)
[place], [place], [address] Phone: [phone]   Fax:  [phone]  Physical Therapy Evaluation  Patient Details  Name: Sandy Sutton MRN: [id number] Date of Birth: April 28, 1956 Referring Provider: Rodell Perna MD  Encounter Date: 04/01/2015      PT End of Session - 04/01/15 1609    Visit Number 1   Number of Visits 26   Date for PT Re-Evaluation 06/24/15   Authorization Type Generic commercial   PT Start Time 0300   PT Stop Time 0400   PT Time Calculation (min) 60 min   Activity Tolerance Patient tolerated treatment well   Behavior During Therapy [facility] for tasks assessed/performed      Past Medical History  Diagnosis Date  . Arthritis     hands and feet  . Pneumonia     hx of-at least 67yrs ago  . History of bronchitis 2015  . History of migraine   . Weakness     right hand and pt states from pinched nerve  . Joint pain   . Joint swelling     bil knee and wrist  . Gout     hx of  . GERD (gastroesophageal reflux disease)     doesn't take any meds  . History of gastric ulcer   . History of colon polyps benign  . Urinary urgency   . Urinary frequency     Past Surgical History  Procedure Laterality Date  . Tubal ligation  1995  . Thyroid lobectomy Right 08/19/2012    Procedure: THYROID LOBECTOMY;  Surgeon: [name], MD;  Location: [facility];  Service: General;  Laterality: Right;  . Knee arthroscopy Left 2015  . Esophagogastroduodenoscopy    . Colonosscopy    . Knee arthroplasty Left 03/13/2015    Procedure: COMPUTER ASSISTED TOTAL KNEE ARTHROPLASTY; LEFT KNEE;  Surgeon: [name], MD;  Location: [facility];  Service: Orthopedics;  Laterality: Left;    There were no vitals filed for this visit.  Visit Diagnosis:  Stiffness of knee joint, left - Plan: PT PLAN OF CARE CERT/RE-CERT  Weakness of left leg - Plan: PT PLAN OF CARE CERT/RE-CERT  Localized edema - Plan: PT PLAN OF CARE  CERT/RE-CERT  Difficulty walking - Plan: PT PLAN OF CARE CERT/RE-CERT  Activity intolerance - Plan: PT PLAN OF CARE CERT/RE-CERT  Left knee pain - Plan: PT PLAN OF CARE CERT/RE-CERT      Subjective Assessment - 04/01/15 1508    Subjective Lt knee TKA and patella replacent. She received HHPT until 10 14/16.  She reports she used to run for exercises/compete. She also landed on LT knee when she tripped over a wire at school 15 years ago.    Limitations Walking;Sitting  She is not driving , stairs , transitional movements     How long can you sit comfortably? 15 min max   How long can you stand comfortably? Havent tried    How long can you walk comfortably? 1/2 mile without stopping   Patient Stated Goals She wants to bent knee to 102 degrees, no swelling , walk 4 miles , wean 3 inch heels, Return to work in January.   Currently in Pain? Yes   Pain Score 4    Pain Location Knee   Pain Orientation Left;Anterior   Pain Descriptors / Indicators Dull;Aching;Sharp   Pain Type Surgical pain   Pain Onset 1 to 4 weeks ago   Pain Frequency Constant  had a day recent in PM only tightness   Aggravating Factors  Bending , standing.    Pain Relieving Factors medication , ice,    Multiple Pain Sites No            [facility] PT Assessment - 04/01/15 1450    Assessment   Medical Diagnosis LT TKA   Referring Provider Rodell Perna MD   Onset Date/Surgical Date 03/13/15   Next MD Visit 04/29/15   Prior Therapy HHPT   Precautions   Precaution Comments Limit no driving   Restrictions   Weight Bearing Restrictions No   Balance Screen   Has the patient fallen in the past 6 months Yes   How many times? 2  getting up and stairs and LT knee gave out   Has the patient had a decrease in activity level because of a fear of falling?  Yes   Is the patient reluctant to leave their home because of a fear of falling?  No   Home Environment   Living Environment Private residence   Type of [place] to enter   Entrance Stairs-Number of Steps 8   Entrance Stairs-Rails None   Home Layout Two level;Multi-level   Alternate Level Stairs-Number of Steps 16   Alternate Level Stairs-Rails Can reach both;Right;Left   Home Equipment Toilet riser;Cane - single point   Prior Function   Level of Independence Needs assistance with homemaking;Requires assistive device for independence  Next week she will be on her own   Vocation Requirements No medications , walk up /down 8 steps with one rail (elevator available,, walk 1-2 miles per day at  [facility], stand for 90 min,    drive.     Cognition   Overall Cognitive Status Within Functional Limits for tasks assessed   Observation/Other Assessments-Edema    Edema Circumferential   Circumferential Edema   Circumferential - Right 40 cm at mid-patella   Circumferential - Left  44 cm at mid-patella    ROM / Strength   AROM / PROM / Strength PROM   AROM   AROM Assessment Site Knee   Right/Left Knee Right;Left   Right Knee Extension 0   Right Knee Flexion 135   Left Knee Extension -15   Left Knee Flexion 65   PROM   PROM Assessment Site Knee   Right/Left Knee Right;Left   Left Knee Extension -2   Left Knee Flexion 83   Strength   Overall Strength Comments RT hip 5/5   LT hip 4-/5 to 4/5    Strength Assessment Site Knee   Right/Left Knee Right;Left   Right Knee Flexion 5/5   Right Knee Extension 5/5   Left Knee Flexion 4+/5   Left Knee Extension 4/5  Pain limiting   Flexibility   Soft Tissue Assessment /Muscle Length --   Hamstrings LT 80 degree   Bed Mobility   Bed Mobility Rolling Right;Rolling Left   Rolling Right 6: Modified independent (Device/Increase time)   Rolling Left 6: Modified independent (Device/Increase time)   Ambulation/Gait   Gait Comments Mild antalgia LT with SPC, Step through                   [facility] Adult PT Treatment/Exercise - 04/01/15 1450    Modalities   Modalities Vasopneumatic    Vasopneumatic   Number Minutes Vasopneumatic  15 minutes   Vasopnuematic Location  Knee   Vasopneumatic Pressure Medium   Vasopneumatic Temperature  36                  PT Short Term Goals - 04/01/15 1556    PT SHORT TERM GOAL #1   Title she will be independent with inital HEP   Time 4   Period Weeks   Status New   PT SHORT TERM GOAL #2   Title She will walk in home without device.    Time 4   Period Weeks   Status New   PT SHORT TERM GOAL #3   Title She will report pain decreased 30% or mroe with transitional movements   Time 4   Period Weeks   Status New   PT SHORT TERM GOAL #4   Title Her active LT knee extension to -5 degrees   Time 4   Period Weeks   Status New   PT SHORT TERM GOAL #5   Title Her active LT knee flexion improved to 110 degrees   Time 4   Period Weeks   Status New           PT Long Term Goals - 04/01/15 1557    PT LONG TERM GOAL #1   Title She will be independetnwith all HEP issued as of last visit   Time 12   Period Weeks   Status New   PT LONG TERM GOAL #2   Title She will report pain 1-2/10 with normal home and work activity   Time 12   Period Weeks   Status New   PT LONG TERM GOAL #3   Title She will walk with no device for normal home and work tasks   Time 12   Period Weeks   Status New   PT LONG TERM GOAL #4   Title She will be able to walk up and down 16 syteps with one rail step over step safely   Time 12   Period Weeks   Status New   PT LONG TERM GOAL #5   Title She will report able to stand for 60 min or more for work tasks.    Time 12   Period Weeks   Status New   Additional Long Term Goals   Additional Long Term Goals Yes   PT LONG TERM GOAL #6   Title She will be able to ride bike at home for exercises   Time 12   Period Weeks   Status New   PT LONG TERM GOAL #7   Title She will be able to sit for 45 min with 1-2 max pain.    Time 12   Period Weeks   Status New               Plan -  04/01/15 1612    Clinical Impression Statement Ms Bo presents post TKA with pain/stiffness/swellling of LT knee limiting tolerance of walking and standing and sitting and tolerance to weight bearing. She should do well with PT    Pt will benefit from skilled therapeutic intervention in order to improve on the following deficits Difficulty walking;Pain;Decreased strength;Decreased activity tolerance;Decreased range of motion   Rehab Potential Good   PT Frequency 3x / week   PT Duration 2 weeks  Then 2x/week for 10 weeks   PT Treatment/Interventions Electrical Stimulation;Cryotherapy;Therapeutic exercise;Manual techniques;Taping;Patient/family education;Passive range of motion;Functional mobility training;Stair training;Gait training;Scar mobilization;Balance training;Neuromuscular re-education   PT Next Visit Plan Review HHPT HEP , Nustep , vaso, tape for edema , manual   Consulted  and Agree with Plan of Care Patient         Problem List Patient Active Problem List   Diagnosis Date Noted  . Osteoarthritis of left knee 03/13/2015  . Thyroid nodule, cold 12/08/2010    [name] PT 04/01/2015, 6:20 PM  [facility] [address] [place], [place], [address] Phone: [phone]   Fax:  [phone]  Name: Sandy Sutton MRN: [id number] Date of Birth: 12/07/1955

## 2015-04-02 ENCOUNTER — Ambulatory Visit: Payer: BLUE CROSS/BLUE SHIELD | Admitting: Physical Therapy

## 2015-04-02 DIAGNOSIS — R29898 Other symptoms and signs involving the musculoskeletal system: Secondary | ICD-10-CM

## 2015-04-02 DIAGNOSIS — R600 Localized edema: Secondary | ICD-10-CM

## 2015-04-02 DIAGNOSIS — M25662 Stiffness of left knee, not elsewhere classified: Secondary | ICD-10-CM

## 2015-04-02 DIAGNOSIS — R262 Difficulty in walking, not elsewhere classified: Secondary | ICD-10-CM

## 2015-04-02 DIAGNOSIS — M25562 Pain in left knee: Secondary | ICD-10-CM

## 2015-04-02 NOTE — Therapy (Signed)
[place], [place], [address] Phone: [phone]   Fax:  [phone]  Physical Therapy Treatment  Patient Details  Name: [place] MRN: [id number] Date of Birth: Nov 17, 1955 Referring Provider: Rodell Perna MD  Encounter Date: 04/02/2015      PT End of Session - 04/02/15 1815    Visit Number 2   Number of Visits 26   Date for PT Re-Evaluation 06/24/15   PT Start Time 0973   PT Stop Time 1505   PT Time Calculation (min) 45 min   Activity Tolerance Patient tolerated treatment well   Behavior During Therapy [facility] for tasks assessed/performed      Past Medical History  Diagnosis Date  . Arthritis     hands and feet  . Pneumonia     hx of-at least 71yrs ago  . History of bronchitis 2015  . History of migraine   . Weakness     right hand and pt states from pinched nerve  . Joint pain   . Joint swelling     bil knee and wrist  . Gout     hx of  . GERD (gastroesophageal reflux disease)     doesn't take any meds  . History of gastric ulcer   . History of colon polyps benign  . Urinary urgency   . Urinary frequency     Past Surgical History  Procedure Laterality Date  . Tubal ligation  1995  . Thyroid lobectomy Right 08/19/2012    Procedure: THYROID LOBECTOMY;  Surgeon: [name], MD;  Location: [facility];  Service: General;  Laterality: Right;  . Knee arthroscopy Left 2015  . Esophagogastroduodenoscopy    . Colonosscopy    . Knee arthroplasty Left 03/13/2015    Procedure: COMPUTER ASSISTED TOTAL KNEE ARTHROPLASTY; LEFT KNEE;  Surgeon: [name], MD;  Location: [place];  Service: Orthopedics;  Laterality: Left;    There were no vitals filed for this visit.  Visit Diagnosis:  Stiffness of knee joint, left  Weakness of left leg  Localized edema  Difficulty walking  Left knee pain      Subjective Assessment - 04/02/15 1432    Subjective Took her medication prior to PT    Currently in Pain? Yes  moderate   Pain Descriptors / Indicators Shooting   Pain Frequency Rarely            [facility] PT Assessment - 04/01/15 1450    Assessment   Medical Diagnosis LT TKA   Referring Provider Rodell Perna MD   Onset Date/Surgical Date 03/13/15   Next MD Visit 04/29/15   Prior Therapy [facility]   Precautions   Precaution Comments Limit no driving   Restrictions   Weight Bearing Restrictions No   Balance Screen   Has the patient fallen in the past 6 months Yes   How many times? 2  getting up and stairs and LT knee gave out   Has the patient had a decrease in activity level because of a fear of falling?  Yes   Is the patient reluctant to leave their home because of a fear of falling?  No   Home Environment   Living Environment Private residence   Type of [place] Access Stairs to enter   Entrance Stairs-Number of Steps 8   Entrance Stairs-Rails None   Home Layout Two level;Multi-level   Alternate Level Stairs-Number of Steps 16   Alternate Level Stairs-Rails  Can reach both;Right;Left   Home Equipment Toilet riser;Cane - single point   Prior Function   Level of Independence Needs assistance with homemaking;Requires assistive device for independence  Next week she will be on her own   Vocation Requirements No medications , walk up /down 8 steps with one rail (elevator available,, walk 1-2 miles per day at  [facility], stand for 90 min,    drive.     Cognition   Overall Cognitive Status Within Functional Limits for tasks assessed   Observation/Other Assessments-Edema    Edema Circumferential   Circumferential Edema   Circumferential - Right 40 cm at mid-patella   Circumferential - Left  44 cm at mid-patella    ROM / Strength   AROM / PROM / Strength PROM   AROM   AROM Assessment Site Knee   Right/Left Knee Right;Left   Right Knee Extension 0   Right Knee Flexion 135   Left Knee Extension -15   Left Knee Flexion 65   PROM   PROM Assessment Site Knee    Right/Left Knee Right;Left   Left Knee Extension -2   Left Knee Flexion 83   Strength   Overall Strength Comments RT hip 5/5   LT hip 4-/5 to 4/5    Strength Assessment Site Knee   Right/Left Knee Right;Left   Right Knee Flexion 5/5   Right Knee Extension 5/5   Left Knee Flexion 4+/5   Left Knee Extension 4/5  Pain limiting   Flexibility   Soft Tissue Assessment /Muscle Length --   Hamstrings LT 80 degree   Bed Mobility   Bed Mobility Rolling Right;Rolling Left   Rolling Right 6: Modified independent (Device/Increase time)   Rolling Left 6: Modified independent (Device/Increase time)   Ambulation/Gait   Gait Comments Mild antalgia LT with SPC, Step through                     [facility] Adult PT Treatment/Exercise - 04/02/15 1420    Knee/Hip Exercises: Aerobic   Nustep 8 minutes   Knee/Hip Exercises: Supine   Quad Sets 5 reps   Heel Slides 10 reps   Knee Extension 5 reps   Knee/Hip Exercises: Prone   Other Prone Exercises AArom 10 X 10 seconds   Other Prone Exercises AA knee flexion pad at thigh for patellar comfort   Modalities   Modalities Moist Heat   Moist Heat Therapy   Number Minutes Moist Heat 20 Minutes  posterior kneeduring manual and exercise   Moist Heat Location Knee   Manual Therapy   Manual therapy comments retrograde, patellar mobilization, stretching, mobilization stretching                PT Education - 04/02/15 1815    Education provided Yes   Education Details use of heat , cold, sleeping suggestions, desensitization   Person(s) Educated Patient   Methods Explanation   Comprehension Verbalized understanding          PT Short Term Goals - 04/01/15 1556    PT SHORT TERM GOAL #1   Title she will be independent with inital HEP   Time 4   Period Weeks   Status New   PT SHORT TERM GOAL #2   Title She will walk in home without device.    Time 4   Period Weeks   Status New   PT SHORT TERM GOAL #3   Title She will report  pain decreased 30% or mroe  with transitional movements   Time 4   Period Weeks   Status New   PT SHORT TERM GOAL #4   Title Her active LT knee extension to -5 degrees   Time 4   Period Weeks   Status New   PT SHORT TERM GOAL #5   Title Her active LT knee flexion improved to 110 degrees   Time 4   Period Weeks   Status New           PT Long Term Goals - 04/01/15 1557    PT LONG TERM GOAL #1   Title She will be independetnwith all HEP issued as of last visit   Time 12   Period Weeks   Status New   PT LONG TERM GOAL #2   Title She will report pain 1-2/10 with normal home and work activity   Time 12   Period Weeks   Status New   PT LONG TERM GOAL #3   Title She will walk with no device for normal home and work tasks   Time 12   Period Weeks   Status New   PT LONG TERM GOAL #4   Title She will be able to walk up and down 16 syteps with one rail step over step safely   Time 12   Period Weeks   Status New   PT LONG TERM GOAL #5   Title She will report able to stand for 60 min or more for work tasks.    Time 12   Period Weeks   Status New   Additional Long Term Goals   Additional Long Term Goals Yes   PT LONG TERM GOAL #6   Title She will be able to ride bike at home for exercises   Time 12   Period Weeks   Status New   PT LONG TERM GOAL #7   Title She will be able to sit for 45 min with 1-2 max pain.    Time 12   Period Weeks   Status New               Plan - 04/02/15 1816    Clinical Impression Statement Good extension paiisve,  flexion  needs more work.   PT Home Exercise Plan continue, flexion focus vs longer walking   Consulted and Agree with Plan of Care Patient        Problem List Patient Active Problem List   Diagnosis Date Noted  . Osteoarthritis of left knee 03/13/2015  . Thyroid nodule, cold 12/08/2010    [facility] 04/02/2015, 6:17 PM  [facility] [address] [place], [place], [address] Phone: [phone]   Fax:  [phone]  Name: [place] MRN: [id number] Date of Birth: Sep 04, 1955    Melvenia Needles, PTA 04/02/2015 6:17 PM Phone: [phone] Fax: [phone]

## 2015-04-04 ENCOUNTER — Ambulatory Visit: Payer: BLUE CROSS/BLUE SHIELD | Admitting: Physical Therapy

## 2015-04-04 DIAGNOSIS — M25562 Pain in left knee: Secondary | ICD-10-CM

## 2015-04-04 DIAGNOSIS — M25662 Stiffness of left knee, not elsewhere classified: Secondary | ICD-10-CM | POA: Diagnosis not present

## 2015-04-04 DIAGNOSIS — R262 Difficulty in walking, not elsewhere classified: Secondary | ICD-10-CM

## 2015-04-04 DIAGNOSIS — R6889 Other general symptoms and signs: Secondary | ICD-10-CM

## 2015-04-04 DIAGNOSIS — R600 Localized edema: Secondary | ICD-10-CM

## 2015-04-04 DIAGNOSIS — R29898 Other symptoms and signs involving the musculoskeletal system: Secondary | ICD-10-CM

## 2015-04-04 NOTE — Patient Instructions (Signed)
   Copyright  VHI. All rights reserved.  HIP: Flexion / KNEE: Extension, Straight Leg Raise   Raise leg, keeping knee straight. Perform slowly. _10__ reps per set, _2__ sets per day, __7_ days per week   Copyright  VHI. All rights reserved.  Heel Slide  USE STRAP Bend knee and pull heel toward buttocks. Hold _30___ seconds. Return. Repeat with other knee. Repeat _3___ times. Do 2____ sessions per day.  http://gt2.exer.us/372   Copyright  VHI. All rights reserved.     Raise leg until knee is straight. _20__ reps per set, _2__ sets per day, _7__ days per week  Copyright  VHI. All rights reserved.  Short Arc [employer] a large can or rolled towel under leg. Straighten knee and leg. Hold __5__ seconds. Repeat with other leg. Repeat __20__ times. Do __2__ sessions per day.  http://gt2.exer.us/365   Copyright  VHI. All rights reserved.  Quad Set   Slowly tighten muscles on thigh of straight leg while counting out loud to __5__. Repeat with other leg. Repeat __10__ times. Do __2__ sessions per day.  http://gt2.exer.us/361   Copyright  VHI. All rights reserved.

## 2015-04-04 NOTE — Therapy (Signed)
[place], [place], [address] Phone: [phone]   Fax:  [phone]  Physical Therapy Treatment  Patient Details  Name: [place] MRN: [id number] Date of Birth: 02/09/1956 Referring Provider: Rodell Perna MD  Encounter Date: 04/04/2015      PT End of Session - 04/04/15 1542    Visit Number 3   Number of Visits 26   Date for PT Re-Evaluation 06/24/15   Authorization Type Generic commercial   PT Start Time 0300   PT Stop Time 0400   PT Time Calculation (min) 60 min      Past Medical History  Diagnosis Date  . Arthritis     hands and feet  . Pneumonia     hx of-at least 89yrs ago  . History of bronchitis 2015  . History of migraine   . Weakness     right hand and pt states from pinched nerve  . Joint pain   . Joint swelling     bil knee and wrist  . Gout     hx of  . GERD (gastroesophageal reflux disease)     doesn't take any meds  . History of gastric ulcer   . History of colon polyps benign  . Urinary urgency   . Urinary frequency     Past Surgical History  Procedure Laterality Date  . Tubal ligation  1995  . Thyroid lobectomy Right 08/19/2012    Procedure: THYROID LOBECTOMY;  Surgeon: [name], MD;  Location: [facility];  Service: General;  Laterality: Right;  . Knee arthroscopy Left 2015  . Esophagogastroduodenoscopy    . Colonosscopy    . Knee arthroplasty Left 03/13/2015    Procedure: COMPUTER ASSISTED TOTAL KNEE ARTHROPLASTY; LEFT KNEE;  Surgeon: [name], MD;  Location: [place];  Service: Orthopedics;  Laterality: Left;    There were no vitals filed for this visit.  Visit Diagnosis:  Stiffness of knee joint, left  Weakness of left leg  Localized edema  Difficulty walking  Left knee pain  Activity intolerance          [facility] PT Assessment - 04/04/15 0001    AROM   Left Knee Flexion 86   PROM   Left Knee Flexion 93                      [facility] Adult PT Treatment/Exercise - 04/04/15 1558    Knee/Hip Exercises: Aerobic   Recumbent Bike  5 min full revolutions  one rest break   Knee/Hip Exercises: Seated   Other Seated Knee/Hip Exercises seated knee flexion stretch   Knee/Hip Exercises: Supine   Quad Sets 20 reps   Quad Sets Limitations tactile and verbal cues required for good contraction   Straight Leg Raises 10 reps   Straight Leg Raises Limitations focus on no laq   Vasopneumatic   Number Minutes Vasopneumatic  15 minutes   Vasopnuematic Location  Knee   Vasopneumatic Pressure Medium   Vasopneumatic Temperature  36   Manual Therapy   Manual Therapy Passive ROM   Passive ROM knee flexion stretch 3 x 30                PT Education - 04/04/15 1542    Education provided Yes   Education Details Level one Knee   Person(s) Educated Patient   Methods Explanation;Handout   Comprehension Verbalized understanding          PT  Short Term Goals - 04/01/15 1556    PT SHORT TERM GOAL #1   Title she will be independent with inital HEP   Time 4   Period Weeks   Status New   PT SHORT TERM GOAL #2   Title She will walk in home without device.    Time 4   Period Weeks   Status New   PT SHORT TERM GOAL #3   Title She will report pain decreased 30% or mroe with transitional movements   Time 4   Period Weeks   Status New   PT SHORT TERM GOAL #4   Title Her active LT knee extension to -5 degrees   Time 4   Period Weeks   Status New   PT SHORT TERM GOAL #5   Title Her active LT knee flexion improved to 110 degrees   Time 4   Period Weeks   Status New           PT Long Term Goals - 04/01/15 1557    PT LONG TERM GOAL #1   Title She will be independetnwith all HEP issued as of last visit   Time 12   Period Weeks   Status New   PT LONG TERM GOAL #2   Title She will report pain 1-2/10 with normal home and work activity   Time 12   Period Weeks   Status New   PT LONG TERM GOAL #3   Title She  will walk with no device for normal home and work tasks   Time 12   Period Weeks   Status New   PT LONG TERM GOAL #4   Title She will be able to walk up and down 16 syteps with one rail step over step safely   Time 12   Period Weeks   Status New   PT LONG TERM GOAL #5   Title She will report able to stand for 60 min or more for work tasks.    Time 12   Period Weeks   Status New   Additional Long Term Goals   Additional Long Term Goals Yes   PT LONG TERM GOAL #6   Title She will be able to ride bike at home for exercises   Time 12   Period Weeks   Status New   PT LONG TERM GOAL #7   Title She will be able to sit for 45 min with 1-2 max pain.    Time 12   Period Weeks   Status New               Plan - 04/04/15 1613    Clinical Impression Statement Good progress with flexion AROM. Issued HEP. Pt with lots of questions about what she can do at home and the [facility].    PT Next Visit Plan Review HEP , Nustep , vaso, tape for edema , manual, REC Bike for ROM        Problem List Patient Active Problem List   Diagnosis Date Noted  . Osteoarthritis of left knee 03/13/2015  . Thyroid nodule, cold 12/08/2010    [name], PTA 04/04/2015, 4:18 PM  [place], [place], [address] Phone: [phone]   Fax:  [phone]  Name: Sandy [place] MRN: [id number] Date of Birth: Dec 09, 1955

## 2015-04-08 ENCOUNTER — Ambulatory Visit: Payer: BLUE CROSS/BLUE SHIELD | Admitting: Physical Therapy

## 2015-04-08 DIAGNOSIS — R262 Difficulty in walking, not elsewhere classified: Secondary | ICD-10-CM

## 2015-04-08 DIAGNOSIS — M25662 Stiffness of left knee, not elsewhere classified: Secondary | ICD-10-CM | POA: Diagnosis not present

## 2015-04-08 DIAGNOSIS — R6889 Other general symptoms and signs: Secondary | ICD-10-CM

## 2015-04-08 DIAGNOSIS — R29898 Other symptoms and signs involving the musculoskeletal system: Secondary | ICD-10-CM

## 2015-04-08 DIAGNOSIS — R600 Localized edema: Secondary | ICD-10-CM

## 2015-04-08 DIAGNOSIS — M25562 Pain in left knee: Secondary | ICD-10-CM

## 2015-04-08 NOTE — Therapy (Signed)
[place], [place], [address] Phone: [phone]   Fax:  [phone]  Physical Therapy Treatment  Patient Details  Name: [place] MRN: [id number] Date of Birth: Oct 16, 1955 Referring Provider: Rodell Perna MD  Encounter Date: 04/08/2015      PT End of Session - 04/08/15 1551    Visit Number 4   Number of Visits 26   Date for PT Re-Evaluation 06/24/15   Authorization Type Generic commercial   PT Start Time 1515   PT Stop Time 1607   PT Time Calculation (min) 52 min   Activity Tolerance Patient tolerated treatment well   Behavior During Therapy [facility] for tasks assessed/performed      Past Medical History  Diagnosis Date  . Arthritis     hands and feet  . Pneumonia     hx of-at least 67yrs ago  . History of bronchitis 2015  . History of migraine   . Weakness     right hand and pt states from pinched nerve  . Joint pain   . Joint swelling     bil knee and wrist  . Gout     hx of  . GERD (gastroesophageal reflux disease)     doesn't take any meds  . History of gastric ulcer   . History of colon polyps benign  . Urinary urgency   . Urinary frequency     Past Surgical History  Procedure Laterality Date  . Tubal ligation  1995  . Thyroid lobectomy Right 08/19/2012    Procedure: THYROID LOBECTOMY;  Surgeon: [name], MD;  Location: [facility];  Service: General;  Laterality: Right;  . Knee arthroscopy Left 2015  . Esophagogastroduodenoscopy    . Colonosscopy    . Knee arthroplasty Left 03/13/2015    Procedure: COMPUTER ASSISTED TOTAL KNEE ARTHROPLASTY; LEFT KNEE;  Surgeon: [name], MD;  Location: [place];  Service: Orthopedics;  Laterality: Left;    There were no vitals filed for this visit.  Visit Diagnosis:  Stiffness of knee joint, left  Weakness of left leg  Localized edema  Difficulty walking  Left knee pain  Activity intolerance      Subjective Assessment -  04/08/15 1519    Subjective no c/o pain except with bending; c/o tightness.  reports compliance with exercises.   Patient Stated Goals She wants to bent knee to 102 degrees, no swelling , walk 4 miles , wean 3 inch heels, Return to work in January.   Currently in Pain? Yes   Pain Score 3    Pain Location Knee   Pain Orientation Left;Anterior   Pain Descriptors / Indicators Shooting   Pain Type Surgical pain   Pain Onset 1 to 4 weeks ago   Pain Frequency Rarely   Aggravating Factors  bending; standing   Pain Relieving Factors medication, ice            [facility] PT Assessment - 04/08/15 1541    AROM   Left Knee Flexion 90   PROM   Left Knee Flexion 95                     [facility] Adult PT Treatment/Exercise - 04/08/15 1520    Knee/Hip Exercises: Aerobic   Recumbent Bike  5 min full revolutions   Nustep L 5 x 5 min   Knee/Hip Exercises: Supine   Quad Sets 20 reps   Quad Sets Limitations min cues for  quad contraction   Short Arc [employer] Strengthening;Left;2 sets;10 reps   Technical brewer Limitations 2# on 2nd set   Heel Slides 10 reps;Right;AAROM   Heel Slides Limitations with strap   Straight Leg Raises 10 reps;2 sets   Straight Leg Raises Limitations 2# 2nd set   Vasopneumatic   Number Minutes Vasopneumatic  15 minutes   Vasopnuematic Location  Knee   Vasopneumatic Pressure Medium   Vasopneumatic Temperature  36                  PT Short Term Goals - 04/08/15 1552    PT SHORT TERM GOAL #1   Title she will be independent with inital HEP   Status On-going   PT SHORT TERM GOAL #2   Title She will walk in home without device.    Status On-going   PT SHORT TERM GOAL #3   Title She will report pain decreased 30% or mroe with transitional movements   Status On-going   PT SHORT TERM GOAL #4   Title Her active LT knee extension to -5 degrees   Status On-going   PT SHORT TERM GOAL #5   Title Her active LT knee flexion improved to 110 degrees    Status On-going           PT Long Term Goals - 04/01/15 1557    PT LONG TERM GOAL #1   Title She will be independetnwith all HEP issued as of last visit   Time 12   Period Weeks   Status New   PT LONG TERM GOAL #2   Title She will report pain 1-2/10 with normal home and work activity   Time 12   Period Weeks   Status New   PT LONG TERM GOAL #3   Title She will walk with no device for normal home and work tasks   Time 12   Period Weeks   Status New   PT LONG TERM GOAL #4   Title She will be able to walk up and down 16 syteps with one rail step over step safely   Time 12   Period Weeks   Status New   PT LONG TERM GOAL #5   Title She will report able to stand for 60 min or more for work tasks.    Time 12   Period Weeks   Status New   Additional Long Term Goals   Additional Long Term Goals Yes   PT LONG TERM GOAL #6   Title She will be able to ride bike at home for exercises   Time 12   Period Weeks   Status New   PT LONG TERM GOAL #7   Title She will be able to sit for 45 min with 1-2 max pain.    Time 12   Period Weeks   Status New               Plan - 04/08/15 1551    Clinical Impression Statement AAROM L knee flexion improved to 90 degrees.  Continues to progress with ROM.  Needs mod cues for quad activation with exercises.   PT Next Visit Plan Nustep , vaso, tape for edema , manual, REC Bike for ROM   PT Home Exercise Plan continue, flexion focus vs longer walking   Consulted and Agree with Plan of Care Patient        Problem List Patient Active Problem List   Diagnosis Date  Noted  . Osteoarthritis of left knee 03/13/2015  . Thyroid nodule, cold 12/08/2010   Laureen Abrahams, PT, DPT 04/08/2015 4:50 PM  [facility] [address] [place], [place], [address] Phone: [phone]   Fax:  [phone]  Name: [name] MRN: [id number] Date of Birth: 01-Aug-1955

## 2015-04-10 ENCOUNTER — Ambulatory Visit: Payer: BLUE CROSS/BLUE SHIELD

## 2015-04-10 DIAGNOSIS — M25662 Stiffness of left knee, not elsewhere classified: Secondary | ICD-10-CM | POA: Diagnosis not present

## 2015-04-10 DIAGNOSIS — R262 Difficulty in walking, not elsewhere classified: Secondary | ICD-10-CM

## 2015-04-10 DIAGNOSIS — M25562 Pain in left knee: Secondary | ICD-10-CM

## 2015-04-10 DIAGNOSIS — R600 Localized edema: Secondary | ICD-10-CM

## 2015-04-10 DIAGNOSIS — R29898 Other symptoms and signs involving the musculoskeletal system: Secondary | ICD-10-CM

## 2015-04-10 NOTE — Therapy (Signed)
[place], [place], [address] Phone: [phone]   Fax:  [phone]  Physical Therapy Treatment  Patient Details  Name: Sandy Sutton MRN: [id number] Date of Birth: 1956/05/12 Referring Provider: Rodell Perna MD  Encounter Date: 04/10/2015      PT End of Session - 04/10/15 1017    Visit Number 5   Number of Visits 26   Date for PT Re-Evaluation 06/24/15   PT Start Time 0900  15 min late   PT Stop Time 0948   PT Time Calculation (min) 48 min   Activity Tolerance Patient tolerated treatment well   Behavior During Therapy [facility] for tasks assessed/performed      Past Medical History  Diagnosis Date  . Arthritis     hands and feet  . Pneumonia     hx of-at least 2yrs ago  . History of bronchitis 2015  . History of migraine   . Weakness     right hand and pt states from pinched nerve  . Joint pain   . Joint swelling     bil knee and wrist  . Gout     hx of  . GERD (gastroesophageal reflux disease)     doesn't take any meds  . History of gastric ulcer   . History of colon polyps benign  . Urinary urgency   . Urinary frequency     Past Surgical History  Procedure Laterality Date  . Tubal ligation  1995  . Thyroid lobectomy Right 08/19/2012    Procedure: THYROID LOBECTOMY;  Surgeon: [name], MD;  Location: [facility];  Service: General;  Laterality: Right;  . Knee arthroscopy Left 2015  . Esophagogastroduodenoscopy    . Colonosscopy    . Knee arthroplasty Left 03/13/2015    Procedure: COMPUTER ASSISTED TOTAL KNEE ARTHROPLASTY; LEFT KNEE;  Surgeon: [name], MD;  Location: [place];  Service: Orthopedics;  Laterality: Left;    There were no vitals filed for this visit.  Visit Diagnosis:  Stiffness of knee joint, left  Weakness of left leg  Localized edema  Difficulty walking  Left knee pain      Subjective Assessment - 04/10/15 0907    Subjective Knee much, much better. I  cna bend better and sit more comfortably and stairs better, step over step on stairs some now. I went shopping yesterday.   Currently in Pain? Yes   Pain Score 3    Pain Location Knee   Pain Orientation Left;Anterior   Pain Descriptors / Indicators Shooting   Pain Type Surgical pain   Pain Frequency --  AM amd PM and after exercise   Aggravating Factors  bending , being on feet   Pain Relieving Factors meds , cold   Multiple Pain Sites No            [facility] PT Assessment - 04/10/15 0919    AROM   Left Knee Extension -15   Left Knee Flexion 90   PROM   Left Knee Extension 0   Left Knee Flexion 90 degrees in prone , supine 100 with pain.                     [facility] Adult PT Treatment/Exercise - 04/10/15 0001    Knee/Hip Exercises: Aerobic   Nustep L6 5 min   Knee/Hip Exercises: Seated   Long Arc Quad Left;15 reps   Long Arc Quad Weight 3 lbs.   Long  Arc Quad Limitations 5 sec hold   Knee/Hip Exercises: Prone   Hamstring Curl 20 reps   Hamstring Curl Limitations 3 pounds   Prone Knee Hang Other (comment)  20 sec x 6   Prone Knee Hang Weights (lbs) 3   Vasopneumatic   Number Minutes Vasopneumatic  15 minutes   Vasopnuematic Location  Knee   Vasopneumatic Pressure Medium   Vasopneumatic Temperature  36      QS with TKE, SAQ and SLR  5-15 reps with cues to max quad activity, Gentle assisited knee flexion stretch 45 sec x 5            PT Short Term Goals - 04/08/15 1552    PT SHORT TERM GOAL #1   Title she will be independent with inital HEP   Status On-going   PT SHORT TERM GOAL #2   Title She will walk in home without device.    Status On-going   PT SHORT TERM GOAL #3   Title She will report pain decreased 30% or mroe with transitional movements   Status On-going   PT SHORT TERM GOAL #4   Title Her active LT knee extension to -5 degrees   Status On-going   PT SHORT TERM GOAL #5   Title Her active LT knee flexion improved to 110 degrees    Status On-going           PT Long Term Goals - 04/01/15 1557    PT LONG TERM GOAL #1   Title She will be independetnwith all HEP issued as of last visit   Time 12   Period Weeks   Status New   PT LONG TERM GOAL #2   Title She will report pain 1-2/10 with normal home and work activity   Time 12   Period Weeks   Status New   PT LONG TERM GOAL #3   Title She will walk with no device for normal home and work tasks   Time 12   Period Weeks   Status New   PT LONG TERM GOAL #4   Title She will be able to walk up and down 16 syteps with one rail step over step safely   Time 12   Period Weeks   Status New   PT LONG TERM GOAL #5   Title She will report able to stand for 60 min or more for work tasks.    Time 12   Period Weeks   Status New   Additional Long Term Goals   Additional Long Term Goals Yes   PT LONG TERM GOAL #6   Title She will be able to ride bike at home for exercises   Time 12   Period Weeks   Status New   PT LONG TERM GOAL #7   Title She will be able to sit for 45 min with 1-2 max pain.    Time 12   Period Weeks   Status New               Plan - 04/10/15 1018    Clinical Impression Statement Ms Bucker is improving with decr pain , improved mobility, improved ROM.    PT Next Visit Plan Nustep , vaso, tape for edema , manual, REC Bike for ROM   Consulted and Agree with Plan of Care Patient        Problem List Patient Active Problem List   Diagnosis Date Noted  . Osteoarthritis of left knee  03/13/2015  . Thyroid nodule, cold 12/08/2010    [name] PT 04/10/2015, 10:19 AM  [facility] [address] [place], [place], [address] Phone: [phone]   Fax:  [phone]  Name: Sandy Sutton MRN: [id number] Date of Birth: [birth year]

## 2015-04-12 ENCOUNTER — Ambulatory Visit: Payer: BLUE CROSS/BLUE SHIELD

## 2015-04-12 DIAGNOSIS — R600 Localized edema: Secondary | ICD-10-CM

## 2015-04-12 DIAGNOSIS — M25562 Pain in left knee: Secondary | ICD-10-CM

## 2015-04-12 DIAGNOSIS — M25662 Stiffness of left knee, not elsewhere classified: Secondary | ICD-10-CM | POA: Diagnosis not present

## 2015-04-12 DIAGNOSIS — R29898 Other symptoms and signs involving the musculoskeletal system: Secondary | ICD-10-CM

## 2015-04-12 DIAGNOSIS — R262 Difficulty in walking, not elsewhere classified: Secondary | ICD-10-CM

## 2015-04-12 NOTE — Therapy (Signed)
[place], [place], [address] Phone: [phone]   Fax:  [phone]  Physical Therapy Treatment  Patient Details  Name: Sandy Sutton MRN: [id number] Date of Birth: Oct 17, 1955 Referring Provider: Rodell Perna MD  Encounter Date: 04/12/2015      PT End of Session - 04/12/15 1018    Visit Number 6   Number of Visits 26   Date for PT Re-Evaluation 06/24/15   PT Start Time 0845   PT Stop Time 0945   PT Time Calculation (min) 60 min   Activity Tolerance Patient tolerated treatment well   Behavior During Therapy [facility] for tasks assessed/performed      Past Medical History  Diagnosis Date  . Arthritis     hands and feet  . Pneumonia     hx of-at least 70yrs ago  . History of bronchitis 2015  . History of migraine   . Weakness     right hand and pt states from pinched nerve  . Joint pain   . Joint swelling     bil knee and wrist  . Gout     hx of  . GERD (gastroesophageal reflux disease)     doesn't take any meds  . History of gastric ulcer   . History of colon polyps benign  . Urinary urgency   . Urinary frequency     Past Surgical History  Procedure Laterality Date  . Tubal ligation  1995  . Thyroid lobectomy Right 08/19/2012    Procedure: THYROID LOBECTOMY;  Surgeon: [name], MD;  Location: [facility];  Service: General;  Laterality: Right;  . Knee arthroscopy Left 2015  . Esophagogastroduodenoscopy    . Colonosscopy    . Knee arthroplasty Left 03/13/2015    Procedure: COMPUTER ASSISTED TOTAL KNEE ARTHROPLASTY; LEFT KNEE;  Surgeon: [name], MD;  Location: [place];  Service: Orthopedics;  Laterality: Left;    There were no vitals filed for this visit.  Visit Diagnosis:  Stiffness of knee joint, left  Weakness of left leg  Localized edema  Difficulty walking  Left knee pain      Subjective Assessment - 04/12/15 0855    Subjective PAin 1/10 today   Currently in Pain? Yes    Pain Score 1    Pain Location Neck   Pain Orientation Left;Anterior   Pain Descriptors / Indicators Aching   Pain Type Surgical pain   Pain Onset More than a month ago   Aggravating Factors  bending knee   Pain Relieving Factors meds , cold   Multiple Pain Sites No            [facility] PT Assessment - 04/12/15 0908    AROM   Left Knee Extension -12   Left Knee Flexion 90  98 degrees post mobs                     [facility] Adult PT Treatment/Exercise - 04/12/15 0001    Knee/Hip Exercises: Aerobic   Nustep L6 6 min LE only   Knee/Hip Exercises: Seated   Long Arc Quad Left;20 reps   Long Arc Quad Weight 5 lbs.   Long [employer] Limitations 5 sec hold   Knee/Hip Exercises: Supine   Straight Leg Raises Limitations 12 reps   Vasopneumatic   Number Minutes Vasopneumatic  15 minutes   Vasopnuematic Location  Knee   Vasopneumatic Pressure Medium   Vasopneumatic Temperature  36  Manual Therapy   Passive ROM flexion stretching with sustained pressure and oscillations , patella mobs.      SAQ 2 pounds with hip extension x 20  LT .              PT Short Term Goals - 04/08/15 1552    PT SHORT TERM GOAL #1   Title she will be independent with inital HEP   Status On-going   PT SHORT TERM GOAL #2   Title She will walk in home without device.    Status On-going   PT SHORT TERM GOAL #3   Title She will report pain decreased 30% or mroe with transitional movements   Status On-going   PT SHORT TERM GOAL #4   Title Her active LT knee extension to -5 degrees   Status On-going   PT SHORT TERM GOAL #5   Title Her active LT knee flexion improved to 110 degrees   Status On-going           PT Long Term Goals - 04/01/15 1557    PT LONG TERM GOAL #1   Title She will be independetnwith all HEP issued as of last visit   Time 12   Period Weeks   Status New   PT LONG TERM GOAL #2   Title She will report pain 1-2/10 with normal home and work activity   Time 12    Period Weeks   Status New   PT LONG TERM GOAL #3   Title She will walk with no device for normal home and work tasks   Time 12   Period Weeks   Status New   PT LONG TERM GOAL #4   Title She will be able to walk up and down 16 syteps with one rail step over step safely   Time 12   Period Weeks   Status New   PT LONG TERM GOAL #5   Title She will report able to stand for 60 min or more for work tasks.    Time 12   Period Weeks   Status New   Additional Long Term Goals   Additional Long Term Goals Yes   PT LONG TERM GOAL #6   Title She will be able to ride bike at home for exercises   Time 12   Period Weeks   Status New   PT LONG TERM GOAL #7   Title She will be able to sit for 45 min with 1-2 max pain.    Time 12   Period Weeks   Status New               Plan - 04/12/15 1019    Clinical Impression Statement Range active LT knee flexion improved to 98 degrees post stretching. Pain managable generally with pain 1-2/10.   PT Next Visit Plan Nustep , vaso, tape for edema , manual, REC Bike for ROM   PT Home Exercise Plan continue, flexion focus vs longer walking   Consulted and Agree with Plan of Care Patient        Problem List Patient Active Problem List   Diagnosis Date Noted  . Osteoarthritis of left knee 03/13/2015  . Thyroid nodule, cold 12/08/2010    [name] PT 04/12/2015, 10:25 AM  [facility] [address] [place], [place], [address] Phone: [phone]   Fax:  [phone]  Name: Sandy Sutton MRN: [id number] Date of Birth: March 09, 1956

## 2015-04-15 ENCOUNTER — Ambulatory Visit: Payer: BLUE CROSS/BLUE SHIELD

## 2015-04-15 DIAGNOSIS — R29898 Other symptoms and signs involving the musculoskeletal system: Secondary | ICD-10-CM

## 2015-04-15 DIAGNOSIS — R262 Difficulty in walking, not elsewhere classified: Secondary | ICD-10-CM

## 2015-04-15 DIAGNOSIS — M25562 Pain in left knee: Secondary | ICD-10-CM

## 2015-04-15 DIAGNOSIS — R600 Localized edema: Secondary | ICD-10-CM

## 2015-04-15 DIAGNOSIS — M25662 Stiffness of left knee, not elsewhere classified: Secondary | ICD-10-CM

## 2015-04-15 NOTE — Therapy (Signed)
[place], [place], [address] Phone: [phone]   Fax:  [phone]  Physical Therapy Treatment  Patient Details  Name: Sandy Sutton MRN: [id number] Date of Birth: [birth year] Referring Provider: Rodell Perna MD  Encounter Date: 04/15/2015      PT End of Session - 04/15/15 1412    Visit Number 7   Number of Visits 26   Date for PT Re-Evaluation 06/24/15   PT Start Time 0132   PT Stop Time 0231   PT Time Calculation (min) 59 min   Activity Tolerance Patient tolerated treatment well   Behavior During Therapy [facility] for tasks assessed/performed      Past Medical History  Diagnosis Date  . Arthritis     hands and feet  . Pneumonia     hx of-at least 53yrs ago  . History of bronchitis 2015  . History of migraine   . Weakness     right hand and pt states from pinched nerve  . Joint pain   . Joint swelling     bil knee and wrist  . Gout     hx of  . GERD (gastroesophageal reflux disease)     doesn't take any meds  . History of gastric ulcer   . History of colon polyps benign  . Urinary urgency   . Urinary frequency     Past Surgical History  Procedure Laterality Date  . Tubal ligation  1995  . Thyroid lobectomy Right 08/19/2012    Procedure: THYROID LOBECTOMY;  Surgeon: [name], MD;  Location: [facility];  Service: General;  Laterality: Right;  . Knee arthroscopy Left 2015  . Esophagogastroduodenoscopy    . Colonosscopy    . Knee arthroplasty Left 03/13/2015    Procedure: COMPUTER ASSISTED TOTAL KNEE ARTHROPLASTY; LEFT KNEE;  Surgeon: [name], MD;  Location: [place];  Service: Orthopedics;  Laterality: Left;    There were no vitals filed for this visit.  Visit Diagnosis:  Stiffness of knee joint, left  Localized edema  Weakness of left leg  Difficulty walking  Left knee pain      Subjective Assessment - 04/15/15 1341    Subjective Doing well . Pain no worse. Still taking  medication   Currently in Pain? No/denies   Pain Score 1    Pain Location Knee   Pain Descriptors / Indicators Aching   Pain Type Surgical pain   Pain Onset More than a month ago   Pain Frequency Intermittent   Aggravating Factors  bending , no meds   Pain Relieving Factors meds , cold   Multiple Pain Sites No            [facility] PT Assessment - 04/15/15 1351    AROM   Left Knee Extension -15   Left Knee Flexion 98  105 post stretching in prone                     [facility] Adult PT Treatment/Exercise - 04/15/15 0001    Knee/Hip Exercises: Aerobic   Recumbent Bike L2 6 min   Knee/Hip Exercises: Seated   Long Arc Quad Left   Long Arc Quad Weight 6 lbs.  25 reps   Long [employer] Limitations 5 sec hold   Knee/Hip Exercises: Supine   Short Arc [employer] Strengthening;Left;20 reps   Short Arc Quad Sets Limitations 6   Straight Leg Raises 10 reps;2 sets   Patellar Mobs  4 way x 3 each   Other Supine Knee/Hip Exercises Gravity flexion stretch in supine   Vasopneumatic   Number Minutes Vasopneumatic  15 minutes   Vasopnuematic Location  Knee   Vasopneumatic Pressure Medium   Vasopneumatic Temperature  36   Manual Therapy   Passive ROM prone fleion stretch 30-40 sec x 10 with rest/recovery breaks oif at least 30 sec between    Standing vector with RT leg slide into abduction with cues to bend Lt knee to slide further out              PT Short Term Goals - 04/15/15 1414    PT SHORT TERM GOAL #1   Title she will be independent with inital HEP   Status On-going   PT SHORT TERM GOAL #2   Title She will walk in home without device.    Status On-going   PT SHORT TERM GOAL #3   Title She will report pain decreased 30% or more with transitional movements   Status Achieved   PT SHORT TERM GOAL #4   Title Her active LT knee extension to -5 degrees   Status On-going   PT SHORT TERM GOAL #5   Title Her active LT knee flexion improved to 110 degrees   Status  On-going           PT Long Term Goals - 04/01/15 1557    PT LONG TERM GOAL #1   Title She will be independetnwith all HEP issued as of last visit   Time 12   Period Weeks   Status New   PT LONG TERM GOAL #2   Title She will report pain 1-2/10 with normal home and work activity   Time 12   Period Weeks   Status New   PT LONG TERM GOAL #3   Title She will walk with no device for normal home and work tasks   Time 12   Period Weeks   Status New   PT LONG TERM GOAL #4   Title She will be able to walk up and down 16 syteps with one rail step over step safely   Time 12   Period Weeks   Status New   PT LONG TERM GOAL #5   Title She will report able to stand for 60 min or more for work tasks.    Time 12   Period Weeks   Status New   Additional Long Term Goals   Additional Long Term Goals Yes   PT LONG TERM GOAL #6   Title She will be able to ride bike at home for exercises   Time 12   Period Weeks   Status New   PT LONG TERM GOAL #7   Title She will be able to sit for 45 min with 1-2 max pain.    Time 12   Period Weeks   Status New               Plan - 04/15/15 1413    Clinical Impression Statement Active range improved at start of the session. she is progressing steadily.    PT Next Visit Plan Nustep , vaso, tape for edema , manual, REC Bike for ROM   PT Home Exercise Plan Continue to push flexion as tolerated   Consulted and Agree with Plan of Care Patient        Problem List Patient Active Problem List   Diagnosis Date Noted  . Osteoarthritis of  left knee 03/13/2015  . Thyroid nodule, cold 12/08/2010    [name] PT 04/15/2015, 2:16 PM  [facility] [address] [place], [place], [address] Phone: [phone]   Fax:  [phone]  Name: Mekayla Soman MRN: [id number] Date of Birth: [birth year]

## 2015-04-17 ENCOUNTER — Ambulatory Visit: Payer: BLUE CROSS/BLUE SHIELD | Attending: Orthopaedic Surgery | Admitting: Physical Therapy

## 2015-04-17 DIAGNOSIS — R262 Difficulty in walking, not elsewhere classified: Secondary | ICD-10-CM | POA: Insufficient documentation

## 2015-04-17 DIAGNOSIS — M25662 Stiffness of left knee, not elsewhere classified: Secondary | ICD-10-CM

## 2015-04-17 DIAGNOSIS — R600 Localized edema: Secondary | ICD-10-CM | POA: Diagnosis present

## 2015-04-17 DIAGNOSIS — R29898 Other symptoms and signs involving the musculoskeletal system: Secondary | ICD-10-CM

## 2015-04-17 DIAGNOSIS — M25562 Pain in left knee: Secondary | ICD-10-CM | POA: Diagnosis present

## 2015-04-17 DIAGNOSIS — R6889 Other general symptoms and signs: Secondary | ICD-10-CM | POA: Diagnosis present

## 2015-04-17 NOTE — Therapy (Signed)
[place], [place], [address] Phone: [phone]   Fax:  [phone]  Physical Therapy Treatment  Patient Details  Name: Sandy Sutton MRN: [id number] Date of Birth: 05-May-1956 Referring Provider: Rodell Perna MD  Encounter Date: 04/17/2015      PT End of Session - 04/17/15 1415    Visit Number 8   Number of Visits 26   Date for PT Re-Evaluation 06/24/15   PT Start Time 2831   PT Stop Time 1427   PT Time Calculation (min) 54 min   Activity Tolerance Patient tolerated treatment well;No increased pain   Behavior During Therapy [facility] for tasks assessed/performed      Past Medical History  Diagnosis Date  . Arthritis     hands and feet  . Pneumonia     hx of-at least 73yrs ago  . History of bronchitis 2015  . History of migraine   . Weakness     right hand and pt states from pinched nerve  . Joint pain   . Joint swelling     bil knee and wrist  . Gout     hx of  . GERD (gastroesophageal reflux disease)     doesn't take any meds  . History of gastric ulcer   . History of colon polyps benign  . Urinary urgency   . Urinary frequency     Past Surgical History  Procedure Laterality Date  . Tubal ligation  1995  . Thyroid lobectomy Right 08/19/2012    Procedure: THYROID LOBECTOMY;  Surgeon: [name], MD;  [facility]: [facility];  Service: General;  Laterality: Right;  . Knee arthroscopy Left 2015  . Esophagogastroduodenoscopy    . Colonosscopy    . Knee arthroplasty Left 03/13/2015    Procedure: COMPUTER ASSISTED TOTAL KNEE ARTHROPLASTY; LEFT KNEE;  Surgeon: [name], MD;  Location: [place];  Service: Orthopedics;  Laterality: Left;    There were no vitals filed for this visit.  Visit Diagnosis:  Stiffness of knee joint, left  Weakness of left leg  Left knee pain  Activity intolerance      Subjective Assessment - 04/17/15 1336    Subjective 1-2 /10 pain  I have the ost problen with  the bending.  Uses her bike at home.    [address] going down stairs not using her hands.  Landed against step and wall  used ice.  Today feels fine , just stiff   Currently in Pain? Yes   Pain Score 2    Pain Location Knee   Pain Orientation Left;Anterior   Pain Descriptors / Indicators Aching   Pain Frequency Intermittent   Aggravating Factors  bending   Pain Relieving Factors meds, heat/cold   Multiple Pain Sites No                         [facility] Adult PT Treatment/Exercise - 04/17/15 1347    Knee/Hip Exercises: Aerobic   Recumbent Bike l1 5 minutes    Knee/Hip Exercises: Seated   Long Arc Quad Weight 6 lbs.  6 LBS 20 X   Knee/Hip Exercises: Supine   Short Arc Field seismologist Sets Limitations 6 LBs   Straight Leg Raises 10 reps;2 sets  3, 6 LBS each   Moist Heat Therapy   Number Minutes Moist Heat 15 Minutes   Moist Heat Location Knee  posterior   Cryotherapy  Number Minutes Cryotherapy 15 Minutes   Cryotherapy Location Knee   Type of Cryotherapy --  cold pack, anterior knee   Manual Therapy   Manual therapy comments manual joint mobilization with movement, quad stretch with patellar glides heel slides assisted   Passive ROM 105 best                  PT Short Term Goals - 04/17/15 1417    PT SHORT TERM GOAL #1   Title she will be independent with inital HEP   Time 4   Period Weeks   Status Achieved   PT SHORT TERM GOAL #2   Title She will walk in home without device.    Time 4   Period Weeks   Status Achieved   PT SHORT TERM GOAL #3   Title She will report pain decreased 30% or more with transitional movements   Time 4   Period Weeks   Status Achieved   PT SHORT TERM GOAL #4   Title Her active LT knee extension to -5 degrees   Status On-going   PT SHORT TERM GOAL #5   Title Her active LT knee flexion improved to 110 degrees   Baseline 105 PROM   Time 4   Period Weeks   Status On-going           PT  Long Term Goals - 04/01/15 1557    PT LONG TERM GOAL #1   Title She will be independetnwith all HEP issued as of last visit   Time 12   Period Weeks   Status New   PT LONG TERM GOAL #2   Title She will report pain 1-2/10 with normal home and work activity   Time 12   Period Weeks   Status New   PT LONG TERM GOAL #3   Title She will walk with no device for normal home and work tasks   Time 12   Period Weeks   Status New   PT LONG TERM GOAL #4   Title She will be able to walk up and down 16 syteps with one rail step over step safely   Time 12   Period Weeks   Status New   PT LONG TERM GOAL #5   Title She will report able to stand for 60 min or more for work tasks.    Time 12   Period Weeks   Status New   Additional Long Term Goals   Additional Long Term Goals Yes   PT LONG TERM GOAL #6   Title She will be able to ride bike at home for exercises   Time 12   Period Weeks   Status New   PT LONG TERM GOAL #7   Title She will be able to sit for 45 min with 1-2 max pain.    Time 12   Period Weeks   Status New               Plan - 04/17/15 1416    Clinical Impression Statement 105 PORM best, manual helpful  0 pain at end fo session   PT Next Visit Plan Nustep , vaso, tape for edema , manual, REC Bike for ROM   PT Home Exercise Plan stretch   Consulted and Agree with Plan of Care Patient        Problem List Patient Active Problem List   Diagnosis Date Noted  . Osteoarthritis of left knee 03/13/2015  .  Thyroid nodule, cold 12/08/2010    HARRIS,KAREN 04/17/2015, 2:18 PM  [facility] [address] [place], [place], [address] Phone: [phone]   Fax:  [phone]  Name: Sandy Sutton MRN: [id number] Date of Birth: [birth year]    Melvenia Needles, PTA 04/17/2015 2:18 PM Phone: [phone] Fax: [phone]

## 2015-04-19 ENCOUNTER — Ambulatory Visit: Payer: BLUE CROSS/BLUE SHIELD

## 2015-04-19 DIAGNOSIS — M25562 Pain in left knee: Secondary | ICD-10-CM

## 2015-04-19 DIAGNOSIS — M25662 Stiffness of left knee, not elsewhere classified: Secondary | ICD-10-CM | POA: Diagnosis not present

## 2015-04-19 DIAGNOSIS — R600 Localized edema: Secondary | ICD-10-CM

## 2015-04-19 DIAGNOSIS — R29898 Other symptoms and signs involving the musculoskeletal system: Secondary | ICD-10-CM

## 2015-04-19 NOTE — Therapy (Signed)
[place], [place], [address] Phone: [phone]   Fax:  [phone]  Physical Therapy Treatment  Patient Details  Name: Sandy Sutton MRN: [id number] Date of Birth: 08-29-1955 Referring Provider: Rodell Perna MD  Encounter Date: 04/19/2015      PT End of Session - 04/19/15 1154    Visit Number 9   Number of Visits 26   Date for PT Re-Evaluation 06/24/15   PT Start Time 1100   PT Stop Time 1200   PT Time Calculation (min) 60 min   Activity Tolerance Patient tolerated treatment well   Behavior During Therapy [facility] for tasks assessed/performed      Past Medical History  Diagnosis Date  . Arthritis     hands and feet  . Pneumonia     hx of-at least 81yrs ago  . History of bronchitis 2015  . History of migraine   . Weakness     right hand and pt states from pinched nerve  . Joint pain   . Joint swelling     bil knee and wrist  . Gout     hx of  . GERD (gastroesophageal reflux disease)     doesn't take any meds  . History of gastric ulcer   . History of colon polyps benign  . Urinary urgency   . Urinary frequency     Past Surgical History  Procedure Laterality Date  . Tubal ligation  1995  . Thyroid lobectomy Right 08/19/2012    Procedure: THYROID LOBECTOMY;  Surgeon: [name], MD;  Location: [facility];  Service: General;  Laterality: Right;  . Knee arthroscopy Left 2015  . Esophagogastroduodenoscopy    . Colonosscopy    . Knee arthroplasty Left 03/13/2015    Procedure: COMPUTER ASSISTED TOTAL KNEE ARTHROPLASTY; LEFT KNEE;  Surgeon: [name], MD;  Location: [place];  Service: Orthopedics;  Laterality: Left;    There were no vitals filed for this visit.  Visit Diagnosis:  Stiffness of knee joint, left  Weakness of left leg  Left knee pain  Localized edema      Subjective Assessment - 04/19/15 1121    Subjective 2/10 most;ly with bending. Buckles at times.    Currently in  Pain? Yes   Pain Score 2             [facility] PT Assessment - 04/19/15 0001    AROM   Left Knee Extension -10   Left Knee Flexion 101                     [facility] Adult PT Treatment/Exercise - 04/19/15 1124    Knee/Hip Exercises: Aerobic   Recumbent Bike L3 6 min   Knee/Hip Exercises: Machines for Strengthening   Cybex Knee Extension 1 plate x 15    Cybex Knee Flexion 2 plates x15   Cybex Leg Press 1 plate x 10, 2 plates x 10 , 3 plates x 20   Knee/Hip Exercises: Standing   Heel Raises Both;20 reps   Heel Raises Limitations and one set  of 10 Lt single leg   Functional Squat Limitations sit to stand x 10 from hieghts    Other Standing Knee Exercises Standing with reach on wall with shift to LT and each on toes. x 12   Knee/Hip Exercises: Supine   Short Arc [employer] Left;15 reps   Short Arc Quad Sets Limitations 8 pounds 10 sec hold  Cryotherapy   Number Minutes Cryotherapy 15 Minutes   Cryotherapy Location Knee   Type of Cryotherapy Ice pack   Manual Therapy   Passive ROM flexion stretcing in supine with oscillations and sustained pressure. Marland Kitchen Extension stretching also  Passive flexion at end 115 degrees                  PT Short Term Goals - 04/17/15 1417    PT SHORT TERM GOAL #1   Title she will be independent with inital HEP   Time 4   Period Weeks   Status Achieved   PT SHORT TERM GOAL #2   Title She will walk in home without device.    Time 4   Period Weeks   Status Achieved   PT SHORT TERM GOAL #3   Title She will report pain decreased 30% or more with transitional movements   Time 4   Period Weeks   Status Achieved   PT SHORT TERM GOAL #4   Title Her active LT knee extension to -5 degrees   Status On-going   PT SHORT TERM GOAL #5   Title Her active LT knee flexion improved to 110 degrees   Baseline 105 PROM   Time 4   Period Weeks   Status On-going           PT Long Term Goals - 04/01/15 1557    PT LONG TERM GOAL #1    Title She will be independetnwith all HEP issued as of last visit   Time 12   Period Weeks   Status New   PT LONG TERM GOAL #2   Title She will report pain 1-2/10 with normal home and work activity   Time 12   Period Weeks   Status New   PT LONG TERM GOAL #3   Title She will walk with no device for normal home and work tasks   Time 12   Period Weeks   Status New   PT LONG TERM GOAL #4   Title She will be able to walk up and down 16 syteps with one rail step over step safely   Time 12   Period Weeks   Status New   PT LONG TERM GOAL #5   Title She will report able to stand for 60 min or more for work tasks.    Time 12   Period Weeks   Status New   Additional Long Term Goals   Additional Long Term Goals Yes   PT LONG TERM GOAL #6   Title She will be able to ride bike at home for exercises   Time 12   Period Weeks   Status New   PT LONG TERM GOAL #7   Title She will be able to sit for 45 min with 1-2 max pain.    Time 12   Period Weeks   Status New               Plan - 04/19/15 1154    Clinical Impression Statement Improving ROM LT knee flexion and tolerating resistance with exercise.    PT Next Visit Plan Nustep or bike , vaso, tape for edema , manual,  strengtheninig   Consulted and Agree with Plan of Care Patient        Problem List Patient Active Problem List   Diagnosis Date Noted  . Osteoarthritis of left knee 03/13/2015  . Thyroid nodule, cold 12/08/2010    Noralee Stain  M PT 04/19/2015, 11:56 AM  [facility] [address] [place], [place], [address] Phone: [phone]   Fax:  [phone]  Name: Sandy Sutton MRN: [id number] Date of Birth: 02/24/1956

## 2015-04-22 ENCOUNTER — Ambulatory Visit: Payer: BLUE CROSS/BLUE SHIELD | Admitting: Physical Therapy

## 2015-04-22 DIAGNOSIS — M25562 Pain in left knee: Secondary | ICD-10-CM

## 2015-04-22 DIAGNOSIS — R29898 Other symptoms and signs involving the musculoskeletal system: Secondary | ICD-10-CM

## 2015-04-22 DIAGNOSIS — R6889 Other general symptoms and signs: Secondary | ICD-10-CM

## 2015-04-22 DIAGNOSIS — R262 Difficulty in walking, not elsewhere classified: Secondary | ICD-10-CM

## 2015-04-22 DIAGNOSIS — M25662 Stiffness of left knee, not elsewhere classified: Secondary | ICD-10-CM | POA: Diagnosis not present

## 2015-04-22 DIAGNOSIS — R600 Localized edema: Secondary | ICD-10-CM

## 2015-04-22 NOTE — Therapy (Signed)
[place], [place], [address] Phone: [phone]   Fax:  [phone]  Physical Therapy Treatment  Patient Details  Name: Sandy Sutton MRN: [id number] Date of Birth: March 10, 1956 Referring Provider: Rodell Perna MD  Encounter Date: 04/22/2015      PT End of Session - 04/22/15 1726    Visit Number 10   Number of Visits 26   Date for PT Re-Evaluation 06/24/15   PT Start Time 1330   PT Stop Time 1433   PT Time Calculation (min) 63 min   Activity Tolerance Patient tolerated treatment well;Patient limited by pain   Behavior During Therapy [facility] for tasks assessed/performed      Past Medical History  Diagnosis Date  . Arthritis     hands and feet  . Pneumonia     hx of-at least 45yrs ago  . History of bronchitis 2015  . History of migraine   . Weakness     right hand and pt states from pinched nerve  . Joint pain   . Joint swelling     bil knee and wrist  . Gout     hx of  . GERD (gastroesophageal reflux disease)     doesn't take any meds  . History of gastric ulcer   . History of colon polyps benign  . Urinary urgency   . Urinary frequency     Past Surgical History  Procedure Laterality Date  . Tubal ligation  1995  . Thyroid lobectomy Right 08/19/2012    Procedure: THYROID LOBECTOMY;  Surgeon: [name], MD;  Location: [facility];  Service: General;  Laterality: Right;  . Knee arthroscopy Left 2015  . Esophagogastroduodenoscopy    . Colonosscopy    . Knee arthroplasty Left 03/13/2015    Procedure: COMPUTER ASSISTED TOTAL KNEE ARTHROPLASTY; LEFT KNEE;  Surgeon: [name], MD;  Location: [place];  Service: Orthopedics;  Laterality: Left;    There were no vitals filed for this visit.  Visit Diagnosis:  Stiffness of knee joint, left  Weakness of left leg  Left knee pain  Localized edema  Activity intolerance  Difficulty walking      Subjective Assessment - 04/22/15 1334    Subjective 2/10 this am  better with medication.   Lost her cane last week.  Pain does not wake her as much. Lateral thigh   Currently in Pain? No/denies   Pain Score 2    Pain Location Knee   Pain Orientation Left;Anterior   Pain Descriptors / Indicators Aching   Pain Frequency Intermittent   Aggravating Factors  1 st thing in am,  still wakes he a t night   Pain Relieving Factors rest, medication, tryins compression hose   Multiple Pain Sites No                         [facility] Adult PT Treatment/Exercise - 04/22/15 1351    Knee/Hip Exercises: Stretches   Quad Stretch Limitations over the edge of mat with patellar glides inferior.   Other Knee/Hip Stretches Standing step stretch 10 X 10 seconds.   Other Knee/Hip Stretches IT band stretch 2 reps 30 seconds LT   Knee/Hip Exercises: Aerobic   Recumbent Bike 6 minutes  stiff initially   Knee/Hip Exercises: Machines for Strengthening   Cybex Leg Press 1 plate  2 legs 1 leg 10 X each   Knee/Hip Exercises: Standing   Heel Raises 10 reps  both 10 X 1 X 10   Forward Step Up Left;2 sets;10 reps;Hand Hold: 1;Step Height: 6"   Wall Squat 10 reps   SLS 27 seconds   SLS with Vectors plyotoss 2 best in a row close SBA for safety   Other Standing Knee Exercises Standing LT with pillowcase slides 3 way 10 X each close SBA no hands.     Knee/Hip Exercises: Seated   Long Arc Quad Left   Long Arc Quad Weight 6 lbs.   Long [employer] Limitations 10 second holds   Sit to [employer]  easy for patient.   Knee/Hip Exercises: Supine   Quad Sets 10 reps   Heel Slides 20 reps  Active and AA   Heel Slides Limitations [id number]   Straight Leg Raises 10 reps   Cryotherapy   Number Minutes Cryotherapy 10 Minutes   Cryotherapy Location Knee   Type of Cryotherapy --  cold pack   Manual Therapy   Manual therapy comments P/A glides with flexion   Passive ROM LT knee flexion/extension                  PT Short Term Goals -  04/22/15 1730    PT SHORT TERM GOAL #1   Title she will be independent with inital HEP   Time 4   Period Weeks   Status Achieved   PT SHORT TERM GOAL #2   Title She will walk in home without device.    Time 4   Period Weeks   Status Achieved   PT SHORT TERM GOAL #3   Title She will report pain decreased 30% or more with transitional movements   Time 4   Period Weeks   Status Achieved   PT SHORT TERM GOAL #4   Title -3/10   Time 4   Period Weeks   Status On-going   PT SHORT TERM GOAL #5   Title Her active LT knee flexion improved to 110 degrees   Baseline 105   Time 4   Period Weeks   Status On-going           PT Long Term Goals - 04/01/15 1557    PT LONG TERM GOAL #1   Title She will be independetnwith all HEP issued as of last visit   Time 12   Period Weeks   Status New   PT LONG TERM GOAL #2   Title She will report pain 1-2/10 with normal home and work activity   Time 12   Period Weeks   Status New   PT LONG TERM GOAL #3   Title She will walk with no device for normal home and work tasks   Time 12   Period Weeks   Status New   PT LONG TERM GOAL #4   Title She will be able to walk up and down 16 syteps with one rail step over step safely   Time 12   Period Weeks   Status New   PT LONG TERM GOAL #5   Title She will report able to stand for 60 min or more for work tasks.    Time 12   Period Weeks   Status New   Additional Long Term Goals   Additional Long Term Goals Yes   PT LONG TERM GOAL #6   Title She will be able to ride bike at home for exercises   Time 12   Period Weeks   Status New  PT LONG TERM GOAL #7   Title She will be able to sit for 45 min with 1-2 max pain.    Time 12   Period Weeks   Status New               Plan - 04/22/15 1337    Clinical Impression Statement Lost my cane.  No pain.  Premedicated.  2/10 pain post session.  ROM Gradually improving 105 AA today.  No quad lag   PT Next Visit Plan stretch, manual,  balance, closed chain strengthening   PT Home Exercise Plan continue strength and stretching   Consulted and Agree with Plan of Care Patient        Problem List Patient Active Problem List   Diagnosis Date Noted  . Osteoarthritis of left knee 03/13/2015  . Thyroid nodule, cold 12/08/2010    [facility] 04/22/2015, 5:33 PM  [facility] [address] [place], [place], [address] Phone: [phone]   Fax:  [phone]  Name: Deneka Greenwalt MRN: [id number] Date of Birth: [birth year]    Melvenia Needles, PTA 04/22/2015 5:33 PM Phone: [phone] Fax: [phone]

## 2015-04-24 ENCOUNTER — Ambulatory Visit: Payer: BLUE CROSS/BLUE SHIELD | Admitting: Physical Therapy

## 2015-04-24 DIAGNOSIS — M25662 Stiffness of left knee, not elsewhere classified: Secondary | ICD-10-CM

## 2015-04-24 DIAGNOSIS — R6889 Other general symptoms and signs: Secondary | ICD-10-CM

## 2015-04-24 DIAGNOSIS — R600 Localized edema: Secondary | ICD-10-CM

## 2015-04-24 DIAGNOSIS — M25562 Pain in left knee: Secondary | ICD-10-CM

## 2015-04-24 DIAGNOSIS — R262 Difficulty in walking, not elsewhere classified: Secondary | ICD-10-CM

## 2015-04-24 DIAGNOSIS — R29898 Other symptoms and signs involving the musculoskeletal system: Secondary | ICD-10-CM

## 2015-04-24 NOTE — Therapy (Signed)
[place], [place], [address] Phone: [phone]   Fax:  [phone]  Physical Therapy Treatment  Patient Details  Name: Sandy Sutton MRN: [id number] Date of Birth: 06-18-1955 Referring Provider: Rodell Perna MD  Encounter Date: 04/24/2015      PT End of Session - 04/24/15 1652    Visit Number 11   Number of Visits 26   Date for PT Re-Evaluation 06/24/15   PT Start Time 4097   PT Stop Time 1518   PT Time Calculation (min) 60 min   Activity Tolerance Patient tolerated treatment well;No increased pain   Behavior During Therapy [facility] for tasks assessed/performed      Past Medical History  Diagnosis Date  . Arthritis     hands and feet  . Pneumonia     hx of-at least 50yrs ago  . History of bronchitis 2015  . History of migraine   . Weakness     right hand and pt states from pinched nerve  . Joint pain   . Joint swelling     bil knee and wrist  . Gout     hx of  . GERD (gastroesophageal reflux disease)     doesn't take any meds  . History of gastric ulcer   . History of colon polyps benign  . Urinary urgency   . Urinary frequency     Past Surgical History  Procedure Laterality Date  . Tubal ligation  1995  . Thyroid lobectomy Right 08/19/2012    Procedure: THYROID LOBECTOMY;  Surgeon: [name], MD;  Location: [facility];  Service: General;  Laterality: Right;  . Knee arthroscopy Left 2015  . Esophagogastroduodenoscopy    . Colonosscopy    . Knee arthroplasty Left 03/13/2015    Procedure: COMPUTER ASSISTED TOTAL KNEE ARTHROPLASTY; LEFT KNEE;  Surgeon: [name], MD;  Location: [place];  Service: Orthopedics;  Laterality: Left;    There were no vitals filed for this visit.  Visit Diagnosis:  Stiffness of knee joint, left  Weakness of left leg  Left knee pain  Localized edema  Activity intolerance  Difficulty walking      Subjective Assessment - 04/24/15 1422    Subjective  3/10 with pain meds.  Better with heat and Ice.  Woke up with   it tight,  It feels like if you put a pin in it it would psss.Has been elevated.     Currently in Pain? Yes   Pain Score 3    Pain Orientation Left;Medial;Lateral   Pain Descriptors / Indicators Tightness   Pain Frequency Intermittent   Aggravating Factors  Woke up   Pain Relieving Factors rest, elevation, heat Ice   Multiple Pain Sites No                         [facility] Adult PT Treatment/Exercise - 04/24/15 1428    Knee/Hip Exercises: Aerobic   Recumbent Bike 6 minutes  stiff initially   Knee/Hip Exercises: Machines for Strengthening   Cybex Knee Extension 2 plates 1/2 range multiple reps.     Cybex Knee Flexion 2 plates,    Cybex Leg Press 1,2 plates 2 legs   Hip Cybex Abduction  and extensipn 10 X each   Knee/Hip Exercises: Seated   Long Arc Quad Left   Long Arc Quad Weight 7 lbs.   Long [employer] Limitations 10+   Cryotherapy   Number Minutes  Cryotherapy 10 Minutes   Cryotherapy Location Knee   Type of Cryotherapy --  cold pack                  PT Short Term Goals - 04/22/15 1730    PT SHORT TERM GOAL #1   Title she will be independent with inital HEP   Time 4   Period Weeks   Status Achieved   PT SHORT TERM GOAL #2   Title She will walk in home without device.    Time 4   Period Weeks   Status Achieved   PT SHORT TERM GOAL #3   Title She will report pain decreased 30% or more with transitional movements   Time 4   Period Weeks   Status Achieved   PT SHORT TERM GOAL #4   Title -3/10   Time 4   Period Weeks   Status On-going   PT SHORT TERM GOAL #5   Title Her active LT knee flexion improved to 110 degrees   Baseline 105   Time 4   Period Weeks   Status On-going           PT Long Term Goals - 04/01/15 1557    PT LONG TERM GOAL #1   Title She will be independetnwith all HEP issued as of last visit   Time 12   Period Weeks   Status New   PT LONG TERM GOAL #2    Title She will report pain 1-2/10 with normal home and work activity   Time 12   Period Weeks   Status New   PT LONG TERM GOAL #3   Title She will walk with no device for normal home and work tasks   Time 12   Period Weeks   Status New   PT LONG TERM GOAL #4   Title She will be able to walk up and down 16 syteps with one rail step over step safely   Time 12   Period Weeks   Status New   PT LONG TERM GOAL #5   Title She will report able to stand for 60 min or more for work tasks.    Time 12   Period Weeks   Status New   Additional Long Term Goals   Additional Long Term Goals Yes   PT LONG TERM GOAL #6   Title She will be able to ride bike at home for exercises   Time 12   Period Weeks   Status New   PT LONG TERM GOAL #7   Title She will be able to sit for 45 min with 1-2 max pain.    Time 12   Period Weeks   Status New               Plan - 04/24/15 1656    Clinical Impression Statement Working toward gym goal today usoing various machines, also discussed the group class for after discharge.    PT Next Visit Plan stretch, manual, balance, closed chain strengthening   PT Home Exercise Plan continue strength and stretching, use 7 LBS for saq and LAQ(Our old weight issued)   Consulted and Agree with Plan of Care Patient        Problem List Patient Active Problem List   Diagnosis Date Noted  . Osteoarthritis of left knee 03/13/2015  . Thyroid nodule, cold 12/08/2010    Sandy Sutton 04/24/2015, 4:59 PM  [place]  [place], [place], [address] Phone: [phone]   Fax:  [phone]  Name: Sandy Sutton MRN: [id number] Date of Birth: 1955/11/21    Melvenia Needles, PTA 04/24/2015 4:59 PM Phone: [phone] Fax: [phone]

## 2015-04-26 ENCOUNTER — Ambulatory Visit: Payer: BLUE CROSS/BLUE SHIELD

## 2015-04-26 DIAGNOSIS — R29898 Other symptoms and signs involving the musculoskeletal system: Secondary | ICD-10-CM

## 2015-04-26 DIAGNOSIS — M25662 Stiffness of left knee, not elsewhere classified: Secondary | ICD-10-CM | POA: Diagnosis not present

## 2015-04-26 DIAGNOSIS — R6889 Other general symptoms and signs: Secondary | ICD-10-CM

## 2015-04-26 DIAGNOSIS — M25562 Pain in left knee: Secondary | ICD-10-CM

## 2015-04-26 DIAGNOSIS — R600 Localized edema: Secondary | ICD-10-CM

## 2015-04-26 DIAGNOSIS — R262 Difficulty in walking, not elsewhere classified: Secondary | ICD-10-CM

## 2015-04-26 NOTE — Therapy (Signed)
[place], [place], [address] Phone: [phone]   Fax:  [phone]  Physical Therapy Treatment  Patient Details  Name: Sandy Sutton MRN: [id number] Date of Birth: 04/07/1956 Referring Provider: Rodell Perna MD  Encounter Date: 04/26/2015      PT End of Session - 04/26/15 1102    Visit Number 2   Number of Visits 26   Date for PT Re-Evaluation 06/24/15   Authorization Type [employer]   PT Start Time 1015   PT Stop Time 1115   PT Time Calculation (min) 60 min   Activity Tolerance Patient tolerated treatment well   Behavior During Therapy [facility] for tasks assessed/performed      Past Medical History  Diagnosis Date  . Arthritis     hands and feet  . Pneumonia     hx of-at least 37yrs ago  . History of bronchitis 2015  . History of migraine   . Weakness     right hand and pt states from pinched nerve  . Joint pain   . Joint swelling     bil knee and wrist  . Gout     hx of  . GERD (gastroesophageal reflux disease)     doesn't take any meds  . History of gastric ulcer   . History of colon polyps benign  . Urinary urgency   . Urinary frequency     Past Surgical History  Procedure Laterality Date  . Tubal ligation  1995  . Thyroid lobectomy Right 08/19/2012    Procedure: THYROID LOBECTOMY;  Surgeon: [name], MD;  [facility];  Service: General;  Laterality: Right;  . Knee arthroscopy Left 2015  . Esophagogastroduodenoscopy    . Colonosscopy    . Knee arthroplasty Left 03/13/2015    Procedure: COMPUTER ASSISTED TOTAL KNEE ARTHROPLASTY; LEFT KNEE;  Surgeon: [name], MD;  Location: [place];  Service: Orthopedics;  Laterality: Left;    There were no vitals filed for this visit.  Visit Diagnosis:  Stiffness of knee joint, left  Weakness of left leg  Left knee pain  Localized edema  Activity intolerance  Difficulty walking      Subjective Assessment - 04/26/15 1027     Subjective iTS A PAIN DAY TODAY.    Currently in Pain? Yes   Pain Score 3    Pain Location Knee   Pain Orientation Left;Medial;Lateral   Pain Descriptors / Indicators Tightness   Pain Type Surgical pain   Pain Onset More than a month ago   Pain Frequency Rarely  with medication   Aggravating Factors  stand 5-10 min, stairs, stretching   Pain Relieving Factors rest, meds            [facility] PT Assessment - 04/26/15 1026    AROM   Left Knee Extension -17   Left Knee Flexion 103    Passive we are able to almost achieve full extension and 115 degrees passive                 [facility] Adult PT Treatment/Exercise - 04/26/15 1023    Knee/Hip Exercises: Stretches   Other Knee/Hip Stretches Seated extension and flexion streches for 5 min and discussion about how to stretch for more prolonged times up to 30 min to facilitate ROM improvements She agreed to try   Knee/Hip Exercises: Aerobic   Recumbent Bike 6 minutes l2   Cryotherapy   Number Minutes Cryotherapy 10 Minutes  Cryotherapy Location Knee   Type of Cryotherapy Ice pack   Manual Therapy   Passive ROM LT knee flexion/extension    QS with terminal, knee extension 5 sec x 30 reps , Passive range with overpressure 30 sec x multiple reps to tolerance. Stretch with IR knee with flexion             PT Education - 04/26/15 1102    Education provided Yes   Education Details prolonged stretching   Person(s) Educated Patient   Methods Explanation;Demonstration;Verbal cues;Tactile cues;Handout   Comprehension Returned demonstration          PT Short Term Goals - 04/22/15 1730    PT SHORT TERM GOAL #1   Title she will be independent with inital HEP   Time 4   Period Weeks   Status Achieved   PT SHORT TERM GOAL #2   Title She will walk in home without device.    Time 4   Period Weeks   Status Achieved   PT SHORT TERM GOAL #3   Title She will report pain decreased 30% or more with transitional movements    Time 4   Period Weeks   Status Achieved   PT SHORT TERM GOAL #4   Title -3/10   Time 4   Period Weeks   Status On-going   PT SHORT TERM GOAL #5   Title Her active LT knee flexion improved to 110 degrees   Baseline 105   Time 4   Period Weeks   Status On-going           PT Long Term Goals - 04/01/15 1557    PT LONG TERM GOAL #1   Title She will be independetnwith all HEP issued as of last visit   Time 12   Period Weeks   Status New   PT LONG TERM GOAL #2   Title She will report pain 1-2/10 with normal home and work activity   Time 12   Period Weeks   Status New   PT LONG TERM GOAL #3   Title She will walk with no device for normal home and work tasks   Time 12   Period Weeks   Status New   PT LONG TERM GOAL #4   Title She will be able to walk up and down 16 syteps with one rail step over step safely   Time 12   Period Weeks   Status New   PT LONG TERM GOAL #5   Title She will report able to stand for 60 min or more for work tasks.    Time 12   Period Weeks   Status New   Additional Long Term Goals   Additional Long Term Goals Yes   PT LONG TERM GOAL #6   Title She will be able to ride bike at home for exercises   Time 12   Period Weeks   Status New   PT LONG TERM GOAL #7   Title She will be able to sit for 45 min with 1-2 max pain.    Time 12   Period Weeks   Status New               Plan - 04/26/15 1103    Clinical Impression Statement She is progressing slowly toward goals and improved range. She wi walking mostly without cane and cont with limp. She should contnue to progress toward goals. She has done well   PT Next  Visit Plan stretch, manual, balance, closed chain strengthening   Consulted and Agree with Plan of Care Patient        Problem List Patient Active Problem List   Diagnosis Date Noted  . Osteoarthritis of left knee 03/13/2015  . Thyroid nodule, cold 12/08/2010    [name] PT 04/26/2015, 11:05 AM  [facility] [address] [place], [place], [address] Phone: [phone]   Fax:  [phone]  Name: Sandy Sutton MRN: [id number] Date of Birth: [birth year]

## 2015-04-26 NOTE — Patient Instructions (Signed)
Seated and prolonged stretches for flexion and extension , trying to get up to 30 min

## 2015-04-29 ENCOUNTER — Ambulatory Visit: Payer: BLUE CROSS/BLUE SHIELD

## 2015-04-29 DIAGNOSIS — M25562 Pain in left knee: Secondary | ICD-10-CM

## 2015-04-29 DIAGNOSIS — R600 Localized edema: Secondary | ICD-10-CM

## 2015-04-29 DIAGNOSIS — R29898 Other symptoms and signs involving the musculoskeletal system: Secondary | ICD-10-CM

## 2015-04-29 DIAGNOSIS — M25662 Stiffness of left knee, not elsewhere classified: Secondary | ICD-10-CM

## 2015-04-29 NOTE — Therapy (Signed)
[place], [place], [address] Phone: [phone]   Fax:  [phone]  Physical Therapy Treatment  Patient Details  Name: Sandy Sutton MRN: [id number] Date of Birth: 23-Sep-1955 Referring Provider: Rodell Perna MD  Encounter Date: 04/29/2015      PT End of Session - 04/29/15 1501    Visit Number 13   Number of Visits 26   Date for PT Re-Evaluation 06/24/15   PT Start Time 0215   PT Stop Time 0315   PT Time Calculation (min) 60 min   Activity Tolerance Patient tolerated treatment well   Behavior During Therapy [facility] for tasks assessed/performed      Past Medical History  Diagnosis Date  . Arthritis     hands and feet  . Pneumonia     hx of-at least 43yrs ago  . History of bronchitis 2015  . History of migraine   . Weakness     right hand and pt states from pinched nerve  . Joint pain   . Joint swelling     bil knee and wrist  . Gout     hx of  . GERD (gastroesophageal reflux disease)     doesn't take any meds  . History of gastric ulcer   . History of colon polyps benign  . Urinary urgency   . Urinary frequency     Past Surgical History  Procedure Laterality Date  . Tubal ligation  1995  . Thyroid lobectomy Right 08/19/2012    Procedure: THYROID LOBECTOMY;  Surgeon: [name], MD;  Location: [facility];  Service: General;  Laterality: Right;  . Knee arthroscopy Left 2015  . Esophagogastroduodenoscopy    . Colonosscopy    . Knee arthroplasty Left 03/13/2015    Procedure: COMPUTER ASSISTED TOTAL KNEE ARTHROPLASTY; LEFT KNEE;  Surgeon: [name], MD;  Location: [place];  Service: Orthopedics;  Laterality: Left;    There were no vitals filed for this visit.  Visit Diagnosis:  Stiffness of knee joint, left  Weakness of left leg  Left knee pain  Localized edema      Subjective Assessment - 04/29/15 1425    Subjective Worse pain on sunday.   Currently in Pain? Yes   Pain Score  2             [facility] PT Assessment - 04/29/15 0001    Circumferential Edema   Circumferential - Left  45 cm  at mid patella   AROM   Left Knee Extension -15   Left Knee Flexion 105   PROM   Left Knee Extension 3   Left Knee Flexion 114                     [facility] Adult PT Treatment/Exercise - 04/29/15 1426    Knee/Hip Exercises: Aerobic   Recumbent Bike L3 7 min   Knee/Hip Exercises: Standing   Lateral Step Up Left;15 reps;Step Height: 8";Hand Hold: 2   Lateral Step Up Limitations also x 1 set wi one hand hold   Functional Squat Limitations sit to stand from 15 inch height x 15 no UE assist   Knee/Hip Exercises: Supine   Quad Sets AROM;Left;15 reps   Quad Sets Limitations for extension stretching foot on ball   Short Arc [employer] Strengthening;Left;15 reps   Short Arc Quad Sets Limitations 5 sec hold for terminal extension.    Cryotherapy   Cryotherapy Location Knee   Vasopneumatic  Number Minutes Vasopneumatic  15 minutes   Vasopnuematic Location  Knee   Vasopneumatic Pressure Medium   Vasopneumatic Temperature  36   Manual Therapy   Passive ROM LT knee flexion > extension  stretching with passive flexion at end 122 degrees                  PT Short Term Goals - 04/29/15 1503    PT SHORT TERM GOAL #5   Title Her active LT knee flexion improved to 110 degrees   Status On-going           PT Long Term Goals - 04/29/15 1503    PT LONG TERM GOAL #1   Title She will be independetn with all HEP issued as of last visit   Status On-going   PT LONG TERM GOAL #2   Title She will report pain 1-2/10 with normal home and work activity   Status On-going   PT LONG TERM GOAL #3   Status On-going   PT LONG TERM GOAL #4   Title She will be able to walk up and down 16 syteps with one rail step over step safely   Status On-going   PT LONG TERM GOAL #5   Title She will report able to stand for 60 min or more for work tasks.    Status On-going   PT  LONG TERM GOAL #6   Title She will be able to ride bike at home for exercises   Status Achieved   PT LONG TERM GOAL #7   Title She will be able to sit for 45 min with 1-2 max pain.    Status On-going               Plan - 04/29/15 1502    Clinical Impression Statement Range slowly improving with pain end range but she does well tolerating stretching.    PT Next Visit Plan stretch, manual, balance, closed chain strengthening   Consulted and Agree with Plan of Care Patient        Problem List Patient Active Problem List   Diagnosis Date Noted  . Osteoarthritis of left knee 03/13/2015  . Thyroid nodule, cold 12/08/2010    [name] PT 04/29/2015, 3:05 PM  [facility] [address] [place], [place], [address] Phone: [phone]   Fax:  [phone]  Name: Sandy Sutton MRN: [id number] Date of Birth: [birth year]

## 2015-04-30 ENCOUNTER — Encounter: Payer: No Typology Code available for payment source | Admitting: Physical Therapy

## 2015-04-30 ENCOUNTER — Other Ambulatory Visit: Payer: Self-pay

## 2015-04-30 DIAGNOSIS — Z1231 Encounter for screening mammogram for malignant neoplasm of breast: Secondary | ICD-10-CM

## 2015-05-03 ENCOUNTER — Ambulatory Visit: Payer: BLUE CROSS/BLUE SHIELD

## 2015-05-03 DIAGNOSIS — M25662 Stiffness of left knee, not elsewhere classified: Secondary | ICD-10-CM

## 2015-05-03 DIAGNOSIS — R600 Localized edema: Secondary | ICD-10-CM

## 2015-05-03 DIAGNOSIS — M25562 Pain in left knee: Secondary | ICD-10-CM

## 2015-05-03 DIAGNOSIS — R29898 Other symptoms and signs involving the musculoskeletal system: Secondary | ICD-10-CM

## 2015-05-03 NOTE — Therapy (Signed)
[place], [place], [address] Phone: [phone]   Fax:  [phone]  Physical Therapy Treatment  Patient Details  Name: Sandy Sutton MRN: [id number] Date of Birth: [birth year] Referring Provider: Rodell Perna MD  Encounter Date: 05/03/2015      PT End of Session - 05/03/15 1146    Visit Number 14   Number of Visits 26   Date for PT Re-Evaluation 06/24/15   PT Start Time 1100   PT Stop Time 1200   PT Time Calculation (min) 60 min   Activity Tolerance Patient tolerated treatment well   Behavior During Therapy [facility] for tasks assessed/performed      Past Medical History  Diagnosis Date  . Arthritis     hands and feet  . Pneumonia     hx of-at least 20yrs ago  . History of bronchitis 2015  . History of migraine   . Weakness     right hand and pt states from pinched nerve  . Joint pain   . Joint swelling     bil knee and wrist  . Gout     hx of  . GERD (gastroesophageal reflux disease)     doesn't take any meds  . History of gastric ulcer   . History of colon polyps benign  . Urinary urgency   . Urinary frequency     Past Surgical History  Procedure Laterality Date  . Tubal ligation  1995  . Thyroid lobectomy Right 08/19/2012    Procedure: THYROID LOBECTOMY;  Surgeon: [name], MD;  Location: [facility];  Service: General;  Laterality: Right;  . Knee arthroscopy Left 2015  . Esophagogastroduodenoscopy    . Colonosscopy    . Knee arthroplasty Left 03/13/2015    Procedure: COMPUTER ASSISTED TOTAL KNEE ARTHROPLASTY; LEFT KNEE;  Surgeon: [name], MD;  Location: [place];  Service: Orthopedics;  Laterality: Left;    There were no vitals filed for this visit.  Visit Diagnosis:  Stiffness of knee joint, left  Weakness of left leg  Left knee pain  Localized edema      Subjective Assessment - 05/03/15 1148    Subjective My knee is better than expected and much better than prior to  surgery.    Currently in Pain? Yes   Pain Score 2    Pain Location Knee   Pain Orientation Left;Anterior;Medial   Pain Descriptors / Indicators Aching;Tightness   Pain Type Surgical pain   Pain Onset More than a month ago   Pain Frequency Rarely  with meds   Aggravating Factors  standing and prolonged walking, stretching   Pain Relieving Factors rest meds ,cold   Multiple Pain Sites No                         [facility] Adult PT Treatment/Exercise - 05/03/15 1103    Knee/Hip Exercises: Stretches   Other Knee/Hip Stretches standing foot on mat x 15 reps then 2 of 30 seconds   Knee/Hip Exercises: Aerobic   Recumbent Bike L3 7 min   Knee/Hip Exercises: Standing   Lateral Step Up Left;Hand Hold: 2;20 reps;Step Height: 8"   Lateral Step Up Limitations cued to DF RT foot to max effort of RT leg.   Functional Squat 20 reps  15 inch height   Knee/Hip Exercises: Seated   Long Arc Quad Strengthening;Left   Long Arc Quad Weight 5 lbs.   [place]  Quad Limitations 10 sec hold x 25 reps   Knee/Hip Exercises: Prone   Hamstring Curl --  25 reps   Hamstring Curl Limitations 5 pounds   Vasopneumatic   Number Minutes Vasopneumatic  15 minutes   Vasopnuematic Location  Knee   Vasopneumatic Pressure Medium   Vasopneumatic Temperature  36   Manual Therapy   Passive ROM LT knee flexion > extension  stretching with passive flexion at end 125 degrees                  PT Short Term Goals - 04/29/15 1503    PT SHORT TERM GOAL #5   Title Her active LT knee flexion improved to 110 degrees   Status On-going           PT Long Term Goals - 04/29/15 1503    PT LONG TERM GOAL #1   Title She will be independetn with all HEP issued as of last visit   Status On-going   PT LONG TERM GOAL #2   Title She will report pain 1-2/10 with normal home and work activity   Status On-going   PT LONG TERM GOAL #3   Status On-going   PT LONG TERM GOAL #4   Title She will be able to  walk up and down 16 syteps with one rail step over step safely   Status On-going   PT LONG TERM GOAL #5   Title She will report able to stand for 60 min or more for work tasks.    Status On-going   PT LONG TERM GOAL #6   Title She will be able to ride bike at home for exercises   Status Achieved   PT LONG TERM GOAL #7   Title She will be able to sit for 45 min with 1-2 max pain.    Status On-going               Plan - 05/03/15 1146    Clinical Impression Statement She is progressinve slowly but steadily with ROm She continues to function without cane. Pleased with progress   PT Next Visit Plan stretch, manual, balance, closed chain strengthening   Consulted and Agree with Plan of Care Patient        Problem List Patient Active Problem List   Diagnosis Date Noted  . Osteoarthritis of left knee 03/13/2015  . Thyroid nodule, cold 12/08/2010    [name] PT 05/03/2015, 11:51 AM  [facility] [address] [place], [place], [address] Phone: [phone]   Fax:  [phone]  Name: Sandy Sutton MRN: [id number] Date of Birth: 1956/04/21

## 2015-05-06 ENCOUNTER — Ambulatory Visit: Payer: BLUE CROSS/BLUE SHIELD | Admitting: Physical Therapy

## 2015-05-06 DIAGNOSIS — R29898 Other symptoms and signs involving the musculoskeletal system: Secondary | ICD-10-CM

## 2015-05-06 DIAGNOSIS — R6889 Other general symptoms and signs: Secondary | ICD-10-CM

## 2015-05-06 DIAGNOSIS — M25662 Stiffness of left knee, not elsewhere classified: Secondary | ICD-10-CM

## 2015-05-06 DIAGNOSIS — M25562 Pain in left knee: Secondary | ICD-10-CM

## 2015-05-06 DIAGNOSIS — R600 Localized edema: Secondary | ICD-10-CM

## 2015-05-06 NOTE — Therapy (Signed)
S[place] Sutton, [place], [address] Phone: [phone]   Fax:  [phone]  Physical Therapy Treatment  Patient Details  Name: Sandy Sutton MRN: [id number] Date of Birth: 1955/07/07 Referring Provider: Rodell Perna MD  Encounter Date: 05/06/2015      PT End of Session - 05/06/15 1632    Visit Number 15   Number of Visits 26   Date for PT Re-Evaluation 06/24/15   PT Start Time 1331   PT Stop Time 1438   PT Time Calculation (min) 67 min   Activity Tolerance Patient tolerated treatment well   Behavior During Therapy [facility] for tasks assessed/performed      Past Medical History  Diagnosis Date  . Arthritis     hands and feet  . Pneumonia     hx of-at least 70yrs ago  . History of bronchitis 2015  . History of migraine   . Weakness     right hand and pt states from pinched nerve  . Joint pain   . Joint swelling     bil knee and wrist  . Gout     hx of  . GERD (gastroesophageal reflux disease)     doesn't take any meds  . History of gastric ulcer   . History of colon polyps benign  . Urinary urgency   . Urinary frequency     Past Surgical History  Procedure Laterality Date  . Tubal ligation  1995  . Thyroid lobectomy Right 08/19/2012    Procedure: THYROID LOBECTOMY;  Surgeon: [name], MD;  Location: [facility];  Service: General;  Laterality: Right;  . Knee arthroscopy Left 2015  . Esophagogastroduodenoscopy    . Colonosscopy    . Knee arthroplasty Left 03/13/2015    Procedure: COMPUTER ASSISTED TOTAL KNEE ARTHROPLASTY; LEFT KNEE;  Surgeon: [name], MD;  Location: [place];  Service: Orthopedics;  Laterality: Left;    There were no vitals filed for this visit.  Visit Diagnosis:  Stiffness of knee joint, left  Weakness of left leg  Left knee pain  Localized edema  Activity intolerance      Subjective Assessment - 05/06/15 1339    Subjective Knee stiff from sitting a long time at  a funeral.  Has a cane today just in case.  Able to bend enough to go up the steps.     Currently in Pain? No/denies                         [facility] Adult PT Treatment/Exercise - 05/06/15 1343    Knee/Hip Exercises: Aerobic   Recumbent Bike L3 6 minutes   Knee/Hip Exercises: Standing   Heel Raises Both;2 sets;10 reps   Lateral Step Up Limitations 8 inch, 2 hands 10 X 2  hands just for balance   Forward Step Up Limitations 20 X 6 inches no hands   SLS 43 seconds LT, RT 46   SLS with Vectors Plyotoss 8 reps in a row best.     Other Standing Knee Exercises ststic lunges with pole 10 X each   Knee/Hip Exercises: Seated   Long [employer]   Long Arc Quad Weight 6 lbs.   Long [employer] Limitations 10 second holds, 10 X eccentric lower, slow emphasis.   Sit to Sand 10 reps   Knee/Hip Exercises: Supine   Heel Slides Limitations AAROM 116, several stretches and strap used.   Vasopneumatic  Number Minutes Vasopneumatic  15 minutes   Vasopnuematic Location  Knee   Vasopneumatic Pressure Medium   Vasopneumatic Temperature  32  leg elevated.   Manual Therapy   Manual Therapy --  retrograde to decrease edema.  Knee sore, Son bump   Manual therapy comments AAFlexion.                    PT Short Term Goals - 05/06/15 1635    PT SHORT TERM GOAL #1   Title she will be independent with inital HEP   Time 4   Period Weeks   Status Achieved   PT SHORT TERM GOAL #2   Title She will walk in home without device.    Time 4   Period Weeks   Status Achieved   PT SHORT TERM GOAL #3   Title She will report pain decreased 30% or more with transitional movements   Time 4   Period Weeks   Status Achieved   PT SHORT TERM GOAL #5   Title Her active LT knee flexion improved to 110 degrees   Baseline 112   Time 4           PT Long Term Goals - 04/29/15 1503    PT LONG TERM GOAL #1   Title She will be independetn with all HEP issued as of last visit    Status On-going   PT LONG TERM GOAL #2   Title She will report pain 1-2/10 with normal home and work activity   Status On-going   PT LONG TERM GOAL #3   Status On-going   PT LONG TERM GOAL #4   Title She will be able to walk up and down 16 syteps with one rail step over step safely   Status On-going   PT LONG TERM GOAL #5   Title She will report able to stand for 60 min or more for work tasks.    Status On-going   PT LONG TERM GOAL #6   Title She will be able to ride bike at home for exercises   Status Achieved   PT LONG TERM GOAL #7   Title She will be able to sit for 45 min with 1-2 max pain.    Status On-going               Plan - 05/06/15 1633    Clinical Impression Statement Recent bump of knee lateral very tender today, just less than 2 hours ago,  May contribute to pain with stretching today.     PT Next Visit Plan stretch, manual, balance, closed chain strengthening   PT Home Exercise Plan continue   Consulted and Agree with Plan of Care Patient        Problem List Patient Active Problem List   Diagnosis Date Noted  . Osteoarthritis of left knee 03/13/2015  . Thyroid nodule, cold 12/08/2010    [facility] 05/06/2015, 4:39 PM  [facility] [address] [place], [place], [address] Phone: [phone]   Fax:  [phone]  Name: Sandy Sutton MRN: [id number] Date of Birth: 12/22/1955    Sandy Sutton, PTA 05/06/2015 4:39 PM Phone: [phone] Fax: [phone]

## 2015-05-06 NOTE — Patient Instructions (Signed)
Use ice and elevation lateral knee.

## 2015-05-08 ENCOUNTER — Encounter: Payer: No Typology Code available for payment source | Admitting: Physical Therapy

## 2015-05-14 ENCOUNTER — Ambulatory Visit: Payer: BLUE CROSS/BLUE SHIELD | Admitting: Physical Therapy

## 2015-05-14 DIAGNOSIS — R6889 Other general symptoms and signs: Secondary | ICD-10-CM

## 2015-05-14 DIAGNOSIS — R600 Localized edema: Secondary | ICD-10-CM

## 2015-05-14 DIAGNOSIS — R29898 Other symptoms and signs involving the musculoskeletal system: Secondary | ICD-10-CM

## 2015-05-14 DIAGNOSIS — R262 Difficulty in walking, not elsewhere classified: Secondary | ICD-10-CM

## 2015-05-14 DIAGNOSIS — M25562 Pain in left knee: Secondary | ICD-10-CM

## 2015-05-14 DIAGNOSIS — M25662 Stiffness of left knee, not elsewhere classified: Secondary | ICD-10-CM | POA: Diagnosis not present

## 2015-05-15 NOTE — Therapy (Signed)
[place], [place], [address] Phone: [phone]   Fax:  [phone]  Physical Therapy Treatment  Patient Details  Name: Sandy Sutton MRN: [id number] Date of Birth: 1956-06-14 Referring Provider: Rodell Perna MD  Encounter Date: 05/14/2015      PT End of Session - 05/14/15 1423    Visit Number 16   Number of Visits 26   Date for PT Re-Evaluation 06/24/15   PT Start Time [id number]   PT Stop Time 1515   PT Time Calculation (min) 60 min      Past Medical History  Diagnosis Date  . Arthritis     hands and feet  . Pneumonia     hx of-at least 50yrs ago  . History of bronchitis 2015  . History of migraine   . Weakness     right hand and pt states from pinched nerve  . Joint pain   . Joint swelling     bil knee and wrist  . Gout     hx of  . GERD (gastroesophageal reflux disease)     doesn't take any meds  . History of gastric ulcer   . History of colon polyps benign  . Urinary urgency   . Urinary frequency     Past Surgical History  Procedure Laterality Date  . Tubal ligation  1995  . Thyroid lobectomy Right 08/19/2012    Procedure: THYROID LOBECTOMY;  Surgeon: [name], MD;  [facility]: [facility];  Service: General;  Laterality: Right;  . Knee arthroscopy Left 2015  . Esophagogastroduodenoscopy    . Colonosscopy    . Knee arthroplasty Left 03/13/2015    Procedure: COMPUTER ASSISTED TOTAL KNEE ARTHROPLASTY; LEFT KNEE;  Surgeon: [name], MD;  Location: [place];  Service: Orthopedics;  Laterality: Left;    There were no vitals filed for this visit.  Visit Diagnosis:  Stiffness of knee joint, left  Weakness of left leg  Left knee pain  Localized edema  Activity intolerance  Difficulty walking      Subjective Assessment - 05/14/15 1418    Subjective I've been practicing on the bike, but I havent been bending. The swelling went down after the massage last time   Currently in  Pain? Yes   Pain Score 2    Pain Location Knee   Pain Orientation Left   Pain Descriptors / Indicators Tightness   Pain Type Surgical pain            [facility] PT Assessment - 05/15/15 0001    PROM   Left Knee Extension 3   Left Knee Flexion 117                     [facility] Adult PT Treatment/Exercise - 05/14/15 0001    Knee/Hip Exercises: Stretches   Quad Stretch Limitations over the edge of mat with patellar glides inferior.   Knee/Hip Exercises: Aerobic   Recumbent Bike L3 6 minutes   Knee/Hip Exercises: Standing   Heel Raises Both;2 sets;10 reps   Lateral Step Up Limitations 8 inch, 2 hands 10 X 2  hands just for balance   Forward Step Up Limitations 20 X 6 inches no hands   SLS 43 seconds LT, RT 46   SLS with Vectors Plyotoss 8 reps in a row best.     Other Standing Knee Exercises ststic lunges with pole 10 X each   Knee/Hip Exercises: Seated   Long Arc  [employer] Strengthening   Long Arc Quad Weight 6 lbs.   Long [employer] Limitations 10 second holds, 10 X eccentric lower, slow emphasis.   Sit to Sand 10 reps   Knee/Hip Exercises: Supine   Heel Slides Limitations AAROM 116, several stretches and strap used.   Vasopneumatic   Number Minutes Vasopneumatic  15 minutes   Vasopnuematic Location  Knee   Vasopneumatic Pressure Medium   Vasopneumatic Temperature  32  leg elevated.                  PT Short Term Goals - 05/14/15 1426    PT SHORT TERM GOAL #1   Title she will be independent with inital HEP   Time 4   Period Weeks   Status Achieved   PT SHORT TERM GOAL #2   Title She will walk in home without device.    Time 4   Period Weeks   Status Achieved   PT SHORT TERM GOAL #3   Title She will report pain decreased 30% or more with transitional movements   Time 4   Period Weeks   Status Achieved   PT SHORT TERM GOAL #4   Title knees  extension improved to -5   Baseline -3   Time 4   Period Weeks   Status Achieved   PT SHORT TERM GOAL #5    Title Her active LT knee flexion improved to 110 degrees   Baseline 112   Status Achieved           PT Long Term Goals - 05/14/15 1428    PT LONG TERM GOAL #2   Title She will report pain 1-2/10 with normal home and work activity   Time 12   Period Weeks   Status On-going   PT LONG TERM GOAL #3   Title She will walk with no device for normal home and work tasks   Time 12   Period Weeks   Status On-going   PT LONG TERM GOAL #5   Title She will report able to stand for 60 min or more for work tasks.    Time 12   Period Weeks   Status On-going               Plan - 05/14/15 1319    Clinical Impression Statement Patient showed up in less pain and without a cain. Responded well to exercises with no increase in pain.   PT Next Visit Plan stretch, manual, balance, closed chain strengthening        Problem List Patient Active Problem List   Diagnosis Date Noted  . Osteoarthritis of left knee 03/13/2015  . Thyroid nodule, cold 12/08/2010   Laury [place], [place] 05/15/2015 1:31 PM PHONE:[phone] [facility] [place], [place], [address] Phone: [phone]   Fax:  [phone]  Name: Abigayil Wellons MRN: [id number] Date of Birth: 1955-06-27

## 2015-05-17 ENCOUNTER — Ambulatory Visit
Admission: RE | Admit: 2015-05-17 | Discharge: 2015-05-17 | Disposition: A | Discharge status: Home / Self Care | Payer: BLUE CROSS/BLUE SHIELD | Source: Ambulatory Visit

## 2015-05-17 DIAGNOSIS — Z1231 Encounter for screening mammogram for malignant neoplasm of breast: Secondary | ICD-10-CM

## 2015-05-23 ENCOUNTER — Ambulatory Visit: Payer: BLUE CROSS/BLUE SHIELD | Admitting: Physical Therapy

## 2015-05-23 ENCOUNTER — Ambulatory Visit: Payer: BLUE CROSS/BLUE SHIELD | Attending: Orthopaedic Surgery | Admitting: Physical Therapy

## 2015-05-23 DIAGNOSIS — R6889 Other general symptoms and signs: Secondary | ICD-10-CM | POA: Insufficient documentation

## 2015-05-23 DIAGNOSIS — R262 Difficulty in walking, not elsewhere classified: Secondary | ICD-10-CM | POA: Insufficient documentation

## 2015-05-23 DIAGNOSIS — R29898 Other symptoms and signs involving the musculoskeletal system: Secondary | ICD-10-CM

## 2015-05-23 DIAGNOSIS — M25662 Stiffness of left knee, not elsewhere classified: Secondary | ICD-10-CM | POA: Diagnosis present

## 2015-05-23 DIAGNOSIS — M25562 Pain in left knee: Secondary | ICD-10-CM | POA: Insufficient documentation

## 2015-05-23 DIAGNOSIS — R600 Localized edema: Secondary | ICD-10-CM | POA: Insufficient documentation

## 2015-05-23 NOTE — Therapy (Signed)
[place], [place], [address] Phone: [phone]   Fax:  [phone]  Physical Therapy Treatment  Patient Details  Name: Sandy Sutton MRN: [id number] Date of Birth: Aug 31, 1955 Referring Provider: Rodell Perna MD  Encounter Date: 05/23/2015      PT End of Session - 05/23/15 1633    Visit Number 17   Number of Visits 26   Date for PT Re-Evaluation 06/24/15   PT Start Time 1419   PT Stop Time 1519   PT Time Calculation (min) 60 min   Activity Tolerance Patient tolerated treatment well   Behavior During Therapy [facility] for tasks assessed/performed      Past Medical History  Diagnosis Date  . Arthritis     hands and feet  . Pneumonia     hx of-at least 69yrs ago  . History of bronchitis 2015  . History of migraine   . Weakness     right hand and pt states from pinched nerve  . Joint pain   . Joint swelling     bil knee and wrist  . Gout     hx of  . GERD (gastroesophageal reflux disease)     doesn't take any meds  . History of gastric ulcer   . History of colon polyps benign  . Urinary urgency   . Urinary frequency     Past Surgical History  Procedure Laterality Date  . Tubal ligation  1995  . Thyroid lobectomy Right 08/19/2012    Procedure: THYROID LOBECTOMY;  Surgeon: [name], MD;  Location: [facility];  Service: General;  Laterality: Right;  . Knee arthroscopy Left 2015  . Esophagogastroduodenoscopy    . Colonosscopy    . Knee arthroplasty Left 03/13/2015    Procedure: COMPUTER ASSISTED TOTAL KNEE ARTHROPLASTY; LEFT KNEE;  Surgeon: [name], MD;  Location: [facility];  Service: Orthopedics;  Laterality: Left;    There were no vitals filed for this visit.  Visit Diagnosis:  Stiffness of knee joint, left  Weakness of left leg  Left knee pain  Localized edema  Activity intolerance      Subjective Assessment - 05/23/15 1423    Subjective 2/10 .  now.  5 with exercise.     Pain Score 2    Pain Location Knee   Pain Orientation Left   Pain Descriptors / Indicators Tightness   Aggravating Factors  exercise   Pain Relieving Factors cold, elevation                         [facility] Adult PT Treatment/Exercise - 05/23/15 1429    High Level Balance   High Level Balance Comments narrow, static and dynamic, compliant non compliant  occasional contact guard.  Bosu also used   Knee/Hip Exercises: Aerobic   Recumbent Bike L0 to 3 6 minutes   Knee/Hip Exercises: Standing   Lateral Step Up Limitations 8 inch 15 + reps   Forward Step Up Both;2 sets;Step Height: 8"   Forward Step Up Limitations --  cues to use foot and hip with this.     Knee/Hip Exercises: Seated   Long Probation officer   Long Arc Quad Weight 9 lbs.   Long Arc Quad Limitations 10 seconds   Hamstring Curl 10 reps   Hamstring Limitations progressed to blue band for home   Knee/Hip Exercises: Supine   Heel Slides 5 reps  active measures 110  Heel Slides Limitations AAROM,  120 degrees with AA use of strap   Vasopneumatic   Number Minutes Vasopneumatic  15 minutes   Vasopnuematic Location  Knee   Vasopneumatic Pressure Medium   Vasopneumatic Temperature  32                  PT Short Term Goals - 05/14/15 1426    PT SHORT TERM GOAL #1   Title she will be independent with inital HEP   Time 4   Period Weeks   Status Achieved   PT SHORT TERM GOAL #2   Title She will walk in home without device.    Time 4   Period Weeks   Status Achieved   PT SHORT TERM GOAL #3   Title She will report pain decreased 30% or more with transitional movements   Time 4   Period Weeks   Status Achieved   PT SHORT TERM GOAL #4   Title knees  extension improved to -5   Baseline -3   Time 4   Period Weeks   Status Achieved   PT SHORT TERM GOAL #5   Title Her active LT knee flexion improved to 110 degrees   Baseline 112   Status Achieved           PT Long Term Goals -  05/23/15 1636    PT LONG TERM GOAL #1   Title She will be independetn with all HEP issued as of last visit   Time 12   Period Weeks   Status On-going   PT LONG TERM GOAL #2   Title She will report pain 1-2/10 with normal home and work activity   Baseline varies,  working a little at desk   Time 12   Period Weeks   Status On-going   PT LONG TERM GOAL #3   Title She will walk with no device for normal home and work tasks   Time 12   Period Weeks   Status Unable to assess   PT LONG TERM GOAL #4   Title She will be able to walk up and down 16 syteps with one rail step over step safely   Baseline not yet consistant, improving   Time 12   Period Weeks   Status On-going   PT LONG TERM GOAL #5   Title She will report able to stand for 60 min or more for work tasks.    Time 12   Period Weeks   Status Unable to assess   PT LONG TERM GOAL #6   Title She will be able to ride bike at home for exercises   Time 12   Period Weeks   Status Achieved   PT LONG TERM GOAL #7   Title She will be able to sit for 45 min with 1-2 max pain.    Time 12   Period Weeks   Status Unable to assess               Plan - 05/23/15 1634    Clinical Impression Statement mild medial/lateral patellar pain post exercise prior to cold.  ROM improving,  Extension -5 prior to exercise 0 post exercises.   Able to progress difficulty of exercises.   PT Next Visit Plan stretch, manual, balance, closed chain strengthening   PT Home Exercise Plan Hamstring band , blue   Consulted and Agree with Plan of Care Patient        Problem List Patient  Active Problem List   Diagnosis Date Noted  . Osteoarthritis of left knee 03/13/2015  . Thyroid nodule, cold 12/08/2010    [facility] 05/23/2015, 4:39 PM  [facility] [address] [place], [place], [address] Phone: [phone]   Fax:  [phone]  Name: Sandy Sutton MRN: [id number] Date of Birth:  August 20, 1955    Melvenia Needles, PTA 05/23/2015 4:39 PM Phone: [phone] Fax: [phone]

## 2015-05-29 ENCOUNTER — Ambulatory Visit: Payer: BLUE CROSS/BLUE SHIELD | Admitting: Physical Therapy

## 2015-05-29 DIAGNOSIS — R6889 Other general symptoms and signs: Secondary | ICD-10-CM

## 2015-05-29 DIAGNOSIS — R29898 Other symptoms and signs involving the musculoskeletal system: Secondary | ICD-10-CM

## 2015-05-29 DIAGNOSIS — M25562 Pain in left knee: Secondary | ICD-10-CM

## 2015-05-29 DIAGNOSIS — M25662 Stiffness of left knee, not elsewhere classified: Secondary | ICD-10-CM

## 2015-05-29 DIAGNOSIS — R600 Localized edema: Secondary | ICD-10-CM

## 2015-05-29 NOTE — Therapy (Signed)
[place], [place], [address] Phone: [phone]   Fax:  [phone]  Physical Therapy Treatment  Patient Details  Name: Sandy Sutton MRN: [id number] Date of Birth: [birth year] Referring Provider: Rodell Perna MD  Encounter Date: 05/29/2015      PT End of Session - 05/29/15 1552    Visit Number 18   Number of Visits 26   Date for PT Re-Evaluation 06/24/15   PT Start Time [id number]   PT Stop Time 1520   PT Time Calculation (min) 59 min   Activity Tolerance Patient tolerated treatment well   Behavior During Therapy [facility] for tasks assessed/performed      Past Medical History  Diagnosis Date  . Arthritis     hands and feet  . Pneumonia     hx of-at least 84yrs ago  . History of bronchitis 2015  . History of migraine   . Weakness     right hand and pt states from pinched nerve  . Joint pain   . Joint swelling     bil knee and wrist  . Gout     hx of  . GERD (gastroesophageal reflux disease)     doesn't take any meds  . History of gastric ulcer   . History of colon polyps benign  . Urinary urgency   . Urinary frequency     Past Surgical History  Procedure Laterality Date  . Tubal ligation  1995  . Thyroid lobectomy Right 08/19/2012    Procedure: THYROID LOBECTOMY;  Surgeon: [name], MD;  Location: [facility];  Service: General;  Laterality: Right;  . Knee arthroscopy Left 2015  . Esophagogastroduodenoscopy    . Colonosscopy    . Knee arthroplasty Left 03/13/2015    Procedure: COMPUTER ASSISTED TOTAL KNEE ARTHROPLASTY; LEFT KNEE;  Surgeon: [name], MD;  Location: [facility];  Service: Orthopedics;  Laterality: Left;    There were no vitals filed for this visit.  Visit Diagnosis:  Stiffness of knee joint, left  Weakness of left leg  Left knee pain  Localized edema  Activity intolerance      Subjective Assessment - 05/29/15 1425    Subjective Pain medial lateral knee.  Pain  varies.  Saw Dr Lorin Mercy yesterday.  He thinks she is doing well.     Currently in Pain? Yes  mi;ld                         [facility] Adult PT Treatment/Exercise - 05/29/15 0001    High Level Balance   High Level Balance Comments Heel toe 10 feet 4 X (imprived).  Tandem with 4 LB barbell pass horizontal abduction shoulders and flexion extension. Fee changed  LT forward and in behind position.   Knee/Hip Exercises: Stretches   Other Knee/Hip Stretches IT band stretch 3 reps 20-30 seconds.     Knee/Hip Exercises: Standing   Knee Flexion 10 reps  4 LBS.  Difficult     Lateral Step Up --  20 X, 8 inches   Lateral Step Up Limitations 8 inch 20 X   Knee/Hip Exercises: Supine   Bridges Limitations feet on ball bridge with hamstring curl.  difficult 10 X with frequant rests   Bridges with Clamshell 10 reps  yellow band   Knee/Hip Exercises: Sidelying   Clams 10 X  cues.  Added to home exercise   Vasopneumatic   Number Minutes Vasopneumatic  15  minutes   Vasopnuematic Location  Knee   Vasopneumatic Pressure Medium   Vasopneumatic Temperature  32   Manual Therapy   Manual therapy comments IT band tender,  Soft tissue work with riged rubber piece, yellowlateral hip less tender post manual.                PT Education - 05/29/15 1551    Education provided Yes   Education Details clams, IT stretch   Person(s) Educated Patient   Methods Explanation;Tactile cues;Verbal cues;Handout   Comprehension Verbalized understanding;Returned demonstration          PT Short Term Goals - 05/14/15 1426    PT SHORT TERM GOAL #1   Title she will be independent with inital HEP   Time 4   Period Weeks   Status Achieved   PT SHORT TERM GOAL #2   Title She will walk in home without device.    Time 4   Period Weeks   Status Achieved   PT SHORT TERM GOAL #3   Title She will report pain decreased 30% or more with transitional movements   Time 4   Period Weeks   Status Achieved    PT SHORT TERM GOAL #4   Title knees  extension improved to -5   Baseline -3   Time 4   Period Weeks   Status Achieved   PT SHORT TERM GOAL #5   Title Her active LT knee flexion improved to 110 degrees   Baseline 112   Status Achieved           PT Long Term Goals - 05/29/15 1555    PT LONG TERM GOAL #1   Title She will be independetn with all HEP issued as of last visit   Time 12   Period Weeks   Status On-going   PT LONG TERM GOAL #2   Title She will report pain 1-2/10 with normal home and work activity   Baseline varies   Time 12   Period Weeks   Status On-going   PT LONG TERM GOAL #3   Title She will walk with no device for normal home and work tasks   Baseline no cane inside home.  Not back to normal work tasks,  spends some time at her desk, cane in community and uneven ground   Time 12   Period Weeks   Status On-going   PT LONG TERM GOAL #4   Title She will be able to walk up and down 16 syteps with one rail step over step safely   Baseline not yet consistant, improving   Time 12   Period Weeks   Status On-going   PT LONG TERM GOAL #5   Title She will report able to stand for 60 min or more for work tasks.    Time 12   Period Weeks   Status Unable to assess               Plan - 05/29/15 1552    Clinical Impression Statement Balance skills improving in clinic.  Patient continues to make gains in function.  Progress toward home exercise goal.  She did not want to be measured today.   PT Next Visit Plan hip strengthening, balance, closed chain, continue stretching   PT Home Exercise Plan clam, IT stretch   Consulted and Agree with Plan of Care Patient        Problem List Patient Active Problem List   Diagnosis Date  Noted  . Osteoarthritis of left knee 03/13/2015  . Thyroid nodule, cold 12/08/2010    [facility] 05/29/2015, 3:58 PM  [facility] [address] [place], [place],  [address] Phone: [phone]   Fax:  [phone]  Name: Sandy Sutton MRN: [id number] Date of Birth: [birth year]    Melvenia Needles, PTA 05/29/2015 3:58 PM Phone: [phone] Fax: [phone]

## 2015-05-29 NOTE — Patient Instructions (Addendum)
Abduction: Clam (Eccentric) - Side-Lying    Lie on side with knees bent. Lift top knee, keeping feet together. Keep trunk steady. Slowly lower for 3-5 seconds. __10_ reps per set, Work toward  [employer] to 3 sets__ sets per day, _3__ days per week.   http://ecce.exer.us/65   Copyright  VHI. All rights reserved.  IT Band: Leg Hang (Side-Lying)    Lie on side with right leg on top. Keep hip and knee straight. Move top leg behind and hang over edge. Hold 20-30___ seconds. Relax. Repeat _3__ times. Do 1___ times a day. Repeat on other side.    Copyright  VHI. All rights reserved.

## 2015-05-31 ENCOUNTER — Ambulatory Visit: Payer: BLUE CROSS/BLUE SHIELD | Admitting: Physical Therapy

## 2015-05-31 DIAGNOSIS — M25562 Pain in left knee: Secondary | ICD-10-CM

## 2015-05-31 DIAGNOSIS — R29898 Other symptoms and signs involving the musculoskeletal system: Secondary | ICD-10-CM

## 2015-05-31 DIAGNOSIS — R600 Localized edema: Secondary | ICD-10-CM

## 2015-05-31 DIAGNOSIS — R262 Difficulty in walking, not elsewhere classified: Secondary | ICD-10-CM

## 2015-05-31 DIAGNOSIS — M25662 Stiffness of left knee, not elsewhere classified: Secondary | ICD-10-CM | POA: Diagnosis not present

## 2015-05-31 DIAGNOSIS — R6889 Other general symptoms and signs: Secondary | ICD-10-CM

## 2015-05-31 NOTE — Patient Instructions (Signed)
   Kristoffer Leamon PT, DPT, LAT, ATC  [facility] Phone: [phone]     

## 2015-05-31 NOTE — Therapy (Signed)
[place], [place], [address] Phone: [phone]   Fax:  [phone]  Physical Therapy Treatment  Patient Details  Name: Sandy Sutton MRN: [id number] Date of Birth: 11-06-1955 Referring Provider: Rodell Perna MD  Encounter Date: 05/31/2015      PT End of Session - 05/31/15 1147    Visit Number 19   Number of Visits 26   Date for PT Re-Evaluation 06/24/15   PT Start Time 1100   PT Stop Time 1205   PT Time Calculation (min) 65 min   Activity Tolerance Patient tolerated treatment well   Behavior During Therapy [facility] for tasks assessed/performed      Past Medical History  Diagnosis Date  . Arthritis     hands and feet  . Pneumonia     hx of-at least 68yrs ago  . History of bronchitis 2015  . History of migraine   . Weakness     right hand and pt states from pinched nerve  . Joint pain   . Joint swelling     bil knee and wrist  . Gout     hx of  . GERD (gastroesophageal reflux disease)     doesn't take any meds  . History of gastric ulcer   . History of colon polyps benign  . Urinary urgency   . Urinary frequency     Past Surgical History  Procedure Laterality Date  . Tubal ligation  1995  . Thyroid lobectomy Right 08/19/2012    Procedure: THYROID LOBECTOMY;  Surgeon: [name], MD;  Location: [facility];  Service: General;  Laterality: Right;  . Knee arthroscopy Left 2015  . Esophagogastroduodenoscopy    . Colonosscopy    . Knee arthroplasty Left 03/13/2015    Procedure: COMPUTER ASSISTED TOTAL KNEE ARTHROPLASTY; LEFT KNEE;  Surgeon: [name], MD;  Location: [facility];  Service: Orthopedics;  Laterality: Left;    There were no vitals filed for this visit.  Visit Diagnosis:  Stiffness of knee joint, left  Weakness of left leg  Left knee pain  Localized edema  Activity intolerance  Difficulty walking      Subjective Assessment - 05/31/15 1104    Subjective "I still feel  some soreness in the knee and stiffness other wise I am doing"   Currently in Pain? Yes   Pain Score 1    Pain Location Knee   Pain Orientation Left   Pain Descriptors / Indicators Sore   Pain Type Surgical pain   Pain Onset More than a month ago   Pain Frequency Rarely   Aggravating Factors  standing, bending the knee, stairs   Pain Relieving Factors cold elevation                         [facility] Adult PT Treatment/Exercise - 05/31/15 1112    Knee/Hip Exercises: Stretches   Hip Flexor Stretch Left;2 reps;30 seconds   Other Knee/Hip Stretches IT band stretch 3 reps 20-30 seconds.     Knee/Hip Exercises: Aerobic   Nustep L8 x 5 min, L6 x 5 min   Knee/Hip Exercises: Machines for Strengthening   Cybex Knee Extension 2 plate up with both down with LLE 2 x 10  1 set up with both down with LLE   Cybex Knee Flexion 2 plates 2 x 10 both LE   Cybex Leg Press 1 plate 3 x 10, 2 sets up with both down  with LLE only   Knee/Hip Exercises: Standing   Forward Step Up Both;2 sets;10 reps;Step Height: 8"   Step Down Left;1 set;10 reps;Step Height: 6"  VC to avoid leaning back during exercise   Knee/Hip Exercises: Supine   Bridges Limitations 1 x 10 with alternating kickouts during bridge   Vasopneumatic   Number Minutes Vasopneumatic  15 minutes   Vasopnuematic Location  Knee   Vasopneumatic Pressure Medium   Vasopneumatic Temperature  32                PT Education - 05/31/15 1145    Education provided Yes   Education Details updated HEP   Person(s) Educated Patient   Methods Explanation   Comprehension Verbalized understanding          PT Short Term Goals - 05/14/15 1426    PT SHORT TERM GOAL #1   Title she will be independent with inital HEP   Time 4   Period Weeks   Status Achieved   PT SHORT TERM GOAL #2   Title She will walk in home without device.    Time 4   Period Weeks   Status Achieved   PT SHORT TERM GOAL #3   Title She will report pain  decreased 30% or more with transitional movements   Time 4   Period Weeks   Status Achieved   PT SHORT TERM GOAL #4   Title knees  extension improved to -5   Baseline -3   Time 4   Period Weeks   Status Achieved   PT SHORT TERM GOAL #5   Title Her active LT knee flexion improved to 110 degrees   Baseline 112   Status Achieved           PT Long Term Goals - 05/29/15 1555    PT LONG TERM GOAL #1   Title She will be independetn with all HEP issued as of last visit   Time 12   Period Weeks   Status On-going   PT LONG TERM GOAL #2   Title She will report pain 1-2/10 with normal home and work activity   Baseline varies   Time 12   Period Weeks   Status On-going   PT LONG TERM GOAL #3   Title She will walk with no device for normal home and work tasks   Baseline no cane inside home.  Not back to normal work tasks,  spends some time at her desk, cane in community and uneven ground   Time 12   Period Weeks   Status On-going   PT LONG TERM GOAL #4   Title She will be able to walk up and down 16 syteps with one rail step over step safely   Baseline not yet consistant, improving   Time 12   Period Weeks   Status On-going   PT LONG TERM GOAL #5   Title She will report able to stand for 60 min or more for work tasks.    Time 12   Period Weeks   Status Unable to assess               Plan - 05/31/15 1148    Clinical Impression Statement [name] reports that she is doing well today. She was able to complete all of todays exercises requiring rest breaks. measured knee flexion today following hamstring strenghtneing and she was able to get 120 degreees actively and 127 passively. pt requested ice for the knee  following todays session.    PT Next Visit Plan hip strengthening, balance, closed chain, continue stretching   PT Home Exercise Plan bridge with alternating kickouts   Consulted and Agree with Plan of Care Patient        Problem List Patient Active Problem  List   Diagnosis Date Noted  . Osteoarthritis of left knee 03/13/2015  . Thyroid nodule, cold 12/08/2010   [name] PT, DPT, LAT, ATC  05/31/2015  11:52 AM     [facility] [address] [place], [place], [address] Phone: [phone]   Fax:  [phone]  Name: Sandy Sutton MRN: [id number] Date of Birth: Nov 03, 1955

## 2015-06-03 LAB — CYTOLOGY - PAP: Pap: NEGATIVE

## 2015-06-04 ENCOUNTER — Ambulatory Visit: Payer: BLUE CROSS/BLUE SHIELD | Admitting: Physical Therapy

## 2015-06-04 DIAGNOSIS — R29898 Other symptoms and signs involving the musculoskeletal system: Secondary | ICD-10-CM

## 2015-06-04 DIAGNOSIS — M25662 Stiffness of left knee, not elsewhere classified: Secondary | ICD-10-CM

## 2015-06-04 DIAGNOSIS — R6889 Other general symptoms and signs: Secondary | ICD-10-CM

## 2015-06-04 DIAGNOSIS — R262 Difficulty in walking, not elsewhere classified: Secondary | ICD-10-CM

## 2015-06-05 NOTE — Therapy (Signed)
[place], [place], [address] Phone: [phone]   Fax:  [phone]  Physical Therapy Treatment  Patient Details  Name: Sandy Sutton MRN: [id number] Date of Birth: 1955-09-07 Referring Provider: Rodell Perna MD  Encounter Date: 06/04/2015      PT End of Session - 06/04/15 1230    Visit Number 20   Number of Visits 26   Date for PT Re-Evaluation 06/24/15   PT Start Time [id number]   PT Stop Time 1225   PT Time Calculation (min) 24 min   Activity Tolerance Patient tolerated treatment well;No increased pain   Behavior During Therapy [facility] for tasks assessed/performed      Past Medical History  Diagnosis Date  . Arthritis     hands and feet  . Pneumonia     hx of-at least 71yrs ago  . History of bronchitis 2015  . History of migraine   . Weakness     right hand and pt states from pinched nerve  . Joint pain   . Joint swelling     bil knee and wrist  . Gout     hx of  . GERD (gastroesophageal reflux disease)     doesn't take any meds  . History of gastric ulcer   . History of colon polyps benign  . Urinary urgency   . Urinary frequency     Past Surgical History  Procedure Laterality Date  . Tubal ligation  1995  . Thyroid lobectomy Right 08/19/2012    Procedure: THYROID LOBECTOMY;  Surgeon: [name], MD;  [facility]: [facility];  Service: General;  Laterality: Right;  . Knee arthroscopy Left 2015  . Esophagogastroduodenoscopy    . Colonosscopy    . Knee arthroplasty Left 03/13/2015    Procedure: COMPUTER ASSISTED TOTAL KNEE ARTHROPLASTY; LEFT KNEE;  Surgeon: [name], MD;  Location: [facility];  Service: Orthopedics;  Laterality: Left;    There were no vitals filed for this visit.  Visit Diagnosis:  Stiffness of knee joint, left  Weakness of left leg  Activity intolerance  Difficulty walking      Subjective Assessment - 06/04/15 1205    Subjective Worked in gym like aI did last  visit.  Sore   Currently in Pain? No/denies   Pain Orientation Left;Anterior   Pain Descriptors / Indicators Sore   Pain Frequency Intermittent   Aggravating Factors  New exercises   Pain Relieving Factors cold elevation                         [facility] Adult PT Treatment/Exercise - 06/04/15 1213    Knee/Hip Exercises: Aerobic   Nustep (p) L9 4 minutes   Knee/Hip Exercises: Machines for Strengthening   Cybex Knee Extension (p) 2 plates as on 075-GRM   Total Gym Leg Press (p) Lt  1 plate sets 2 !X each     Other exercises:8" step up, 6" step down, 2 sets 10 each Bridges with kick outs 10 x alternating, able to do several in a row.   IT band stretch 3 X 20 - 30 seconds hold.  Elliptical  2 minutes.  HR 131.  Cued to slow down.  Cooled down with walking around gym.                PT Short Term Goals - 05/14/15 1426    PT SHORT TERM GOAL #1   Title she will be  independent with inital HEP   Time 4   Period Weeks   Status Achieved   PT SHORT TERM GOAL #2   Title She will walk in home without device.    Time 4   Period Weeks   Status Achieved   PT SHORT TERM GOAL #3   Title She will report pain decreased 30% or more with transitional movements   Time 4   Period Weeks   Status Achieved   PT SHORT TERM GOAL #4   Title knees  extension improved to -5   Baseline -3   Time 4   Period Weeks   Status Achieved   PT SHORT TERM GOAL #5   Title Her active LT knee flexion improved to 110 degrees   Baseline 112   Status Achieved           PT Long Term Goals - 05/29/15 1555    PT LONG TERM GOAL #1   Title She will be independetn with all HEP issued as of last visit   Time 12   Period Weeks   Status On-going   PT LONG TERM GOAL #2   Title She will report pain 1-2/10 with normal home and work activity   Baseline varies   Time 12   Period Weeks   Status On-going   PT LONG TERM GOAL #3   Title She will walk with no device for normal home and work tasks    Baseline no cane inside home.  Not back to normal work tasks,  spends some time at her desk, cane in community and uneven ground   Time 12   Period Weeks   Status On-going   PT LONG TERM GOAL #4   Title She will be able to walk up and down 16 syteps with one rail step over step safely   Baseline not yet consistant, improving   Time 12   Period Weeks   Status On-going   PT LONG TERM GOAL #5   Title She will report able to stand for 60 min or more for work tasks.    Time 12   Period Weeks   Status Unable to assess     Plan :  No increased pain with session.  Strength focus.  Short session due to patient was late.  She is augmenting her home exercise with exercises at the gym. Next visit:  Closed chain.  Check stairs, balance activities.          Problem List Patient Active Problem List   Diagnosis Date Noted  . Osteoarthritis of left knee 03/13/2015  . Thyroid nodule, cold 12/08/2010    [facility] 06/05/2015, 10:50 AM  [place], [place], [address] Phone: [phone]   Fax:  [phone]  Name: Sandy Sutton MRN: [id number] Date of Birth: 11-28-1955    Melvenia Needles, PTA 06/05/2015 10:50 AM Phone: [phone] Fax: [phone]

## 2015-06-07 ENCOUNTER — Ambulatory Visit: Payer: BLUE CROSS/BLUE SHIELD | Admitting: Physical Therapy

## 2015-06-07 DIAGNOSIS — M25662 Stiffness of left knee, not elsewhere classified: Secondary | ICD-10-CM | POA: Diagnosis not present

## 2015-06-07 DIAGNOSIS — R262 Difficulty in walking, not elsewhere classified: Secondary | ICD-10-CM

## 2015-06-07 DIAGNOSIS — M25562 Pain in left knee: Secondary | ICD-10-CM

## 2015-06-07 DIAGNOSIS — R29898 Other symptoms and signs involving the musculoskeletal system: Secondary | ICD-10-CM

## 2015-06-07 DIAGNOSIS — R6889 Other general symptoms and signs: Secondary | ICD-10-CM

## 2015-06-07 DIAGNOSIS — R600 Localized edema: Secondary | ICD-10-CM

## 2015-06-07 NOTE — Therapy (Signed)
[place], [place], [address] Phone: [phone]   Fax:  [phone]  Physical Therapy Treatment  Patient Details  Name: Sandy [place] MRN: [id number] Date of Birth: 04-11-1956 Referring Provider: Rodell Perna MD  Encounter Date: 06/07/2015      PT End of Session - 06/07/15 1144    Visit Number 21   Number of Visits 26   Date for PT Re-Evaluation 06/24/15   Authorization Type BCBS   PT Start Time 1101   PT Stop Time 1145   PT Time Calculation (min) 44 min   Activity Tolerance Patient tolerated treatment well;No increased pain   Behavior During Therapy [facility] for tasks assessed/performed      Past Medical History  Diagnosis Date  . Arthritis     hands and feet  . Pneumonia     hx of-at least 75yrs ago  . History of bronchitis 2015  . History of migraine   . Weakness     right hand and pt states from pinched nerve  . Joint pain   . Joint swelling     bil knee and wrist  . Gout     hx of  . GERD (gastroesophageal reflux disease)     doesn't take any meds  . History of gastric ulcer   . History of colon polyps benign  . Urinary urgency   . Urinary frequency     Past Surgical History  Procedure Laterality Date  . Tubal ligation  1995  . Thyroid lobectomy Right 08/19/2012    Procedure: THYROID LOBECTOMY;  Surgeon: [name], MD;  Location: [facility];  Service: General;  Laterality: Right;  . Knee arthroscopy Left 2015  . Esophagogastroduodenoscopy    . Colonosscopy    . Knee arthroplasty Left 03/13/2015    Procedure: COMPUTER ASSISTED TOTAL KNEE ARTHROPLASTY; LEFT KNEE;  Surgeon: [name], MD;  Location: [place];  Service: Orthopedics;  Laterality: Left;    There were no vitals filed for this visit.  Visit Diagnosis:  Stiffness of knee joint, left  Weakness of left leg  Activity intolerance  Difficulty walking  Left knee pain  Localized edema      Subjective Assessment -  06/07/15 1105    Subjective Knee feels fine; c/o tightness today.   Currently in Pain? No/denies                         [facility] Adult PT Treatment/Exercise - 06/07/15 1106    Ambulation/Gait   Stairs Yes   Stair Management Technique Alternating pattern;No rails   Number of Stairs 12   Height of Stairs 8   Gait Comments decreased eccentric control of LLE with decending stairs   Knee/Hip Exercises: Aerobic   Elliptical Inc3; Res1 for cool down x 6 min   Nustep L9 5 minutes   Knee/Hip Exercises: Machines for Strengthening   Cybex Knee Extension 30# 2x10   Cybex Knee Flexion 30# 2x10   Knee/Hip Exercises: Standing   Step Down Left;2 sets;15 reps;Hand Hold: 1;Step Height: 4"   Step Down Limitations with cues for eccentric control   SLS 5x20 sec with R hip abdct on level and compliant surface   SLS with Vectors SLS up to 55 sec on compliant surface with ball toss   Other Standing Knee Exercises standing hip 4-way with green theraband x 15 bil  PT Short Term Goals - 05/14/15 1426    PT SHORT TERM GOAL #1   Title she will be independent with inital HEP   Time 4   Period Weeks   Status Achieved   PT SHORT TERM GOAL #2   Title She will walk in home without device.    Time 4   Period Weeks   Status Achieved   PT SHORT TERM GOAL #3   Title She will report pain decreased 30% or more with transitional movements   Time 4   Period Weeks   Status Achieved   PT SHORT TERM GOAL #4   Title knees  extension improved to -5   Baseline -3   Time 4   Period Weeks   Status Achieved   PT SHORT TERM GOAL #5   Title Her active LT knee flexion improved to 110 degrees   Baseline 112   Status Achieved           PT Long Term Goals - 05/29/15 1555    PT LONG TERM GOAL #1   Title She will be independetn with all HEP issued as of last visit   Time 12   Period Weeks   Status On-going   PT LONG TERM GOAL #2   Title She will report pain 1-2/10 with  normal home and work activity   Baseline varies   Time 12   Period Weeks   Status On-going   PT LONG TERM GOAL #3   Title She will walk with no device for normal home and work tasks   Baseline no cane inside home.  Not back to normal work tasks,  spends some time at her desk, cane in community and uneven ground   Time 12   Period Weeks   Status On-going   PT LONG TERM GOAL #4   Title She will be able to walk up and down 16 syteps with one rail step over step safely   Baseline not yet consistant, improving   Time 12   Period Weeks   Status On-going   PT LONG TERM GOAL #5   Title She will report able to stand for 60 min or more for work tasks.    Time 12   Period Weeks   Status Unable to assess               Plan - 06/07/15 1145    Clinical Impression Statement Pt tolerated increased standing and balance exercises today but reported LLE fatigue with activities.  Will continue to benefit from PT to maximize function.   PT Next Visit Plan hip strengthening, balance, closed chain, continue stretching   PT Home Exercise Plan bridge with alternating kickouts   Consulted and Agree with Plan of Care Patient        Problem List Patient Active Problem List   Diagnosis Date Noted  . Osteoarthritis of left knee 03/13/2015  . Thyroid nodule, cold 12/08/2010   Laureen Abrahams, PT, DPT 06/07/2015 11:50 AM  [place] [address] [place], [place], [address] Phone: [phone]   Fax:  [phone]  Name: Sandy Sutton MRN: [id number] Date of Birth: Aug 14, 1955

## 2015-06-12 ENCOUNTER — Ambulatory Visit: Payer: BLUE CROSS/BLUE SHIELD | Admitting: Physical Therapy

## 2015-06-12 DIAGNOSIS — M25562 Pain in left knee: Secondary | ICD-10-CM

## 2015-06-12 DIAGNOSIS — R262 Difficulty in walking, not elsewhere classified: Secondary | ICD-10-CM

## 2015-06-12 DIAGNOSIS — R600 Localized edema: Secondary | ICD-10-CM

## 2015-06-12 DIAGNOSIS — R6889 Other general symptoms and signs: Secondary | ICD-10-CM

## 2015-06-12 DIAGNOSIS — M25662 Stiffness of left knee, not elsewhere classified: Secondary | ICD-10-CM

## 2015-06-12 DIAGNOSIS — R29898 Other symptoms and signs involving the musculoskeletal system: Secondary | ICD-10-CM

## 2015-06-12 NOTE — Therapy (Signed)
[place], [place], [address] Phone: [phone]   Fax:  [phone]  Physical Therapy Treatment  Patient Details  Name: Sandy Sutton MRN: [id number] Date of Birth: 20-Aug-1955 Referring Provider: Rodell Perna MD  Encounter Date: 06/12/2015      PT End of Session - 06/12/15 1314    Visit Number 22   Number of Visits 26   Date for PT Re-Evaluation 06/24/15   Authorization Type BCBS   PT Start Time 1110  pt arrived late   PT Stop Time 1155   PT Time Calculation (min) 45 min   Activity Tolerance Patient tolerated treatment well;No increased pain   Behavior During Therapy [facility] for tasks assessed/performed      Past Medical History  Diagnosis Date  . Arthritis     hands and feet  . Pneumonia     hx of-at least 85yrs ago  . History of bronchitis 2015  . History of migraine   . Weakness     right hand and pt states from pinched nerve  . Joint pain   . Joint swelling     bil knee and wrist  . Gout     hx of  . GERD (gastroesophageal reflux disease)     doesn't take any meds  . History of gastric ulcer   . History of colon polyps benign  . Urinary urgency   . Urinary frequency     Past Surgical History  Procedure Laterality Date  . Tubal ligation  1995  . Thyroid lobectomy Right 08/19/2012    Procedure: THYROID LOBECTOMY;  Surgeon: [name], MD;  Location: [facility];  Service: General;  Laterality: Right;  . Knee arthroscopy Left 2015  . Esophagogastroduodenoscopy    . Colonosscopy    . Knee arthroplasty Left 03/13/2015    Procedure: COMPUTER ASSISTED TOTAL KNEE ARTHROPLASTY; LEFT KNEE;  Surgeon: [name], MD;  Location: [place];  Service: Orthopedics;  Laterality: Left;    There were no vitals filed for this visit.  Visit Diagnosis:  Stiffness of knee joint, left  Weakness of left leg  Activity intolerance  Difficulty walking  Left knee pain  Localized edema       Subjective Assessment - 06/12/15 1115    Subjective L knee was sore after last session; but having some pain at patella and back of knee today.  Reached 10,000 steps yesterday   How long can you sit comfortably? 30 min   How long can you stand comfortably? 30 min   How long can you walk comfortably? 3 miles (45 min)   Patient Stated Goals She wants to bent knee to 102 degrees, no swelling , walk 4 miles , wean 3 inch heels, Return to work in January.   Currently in Pain? Yes   Pain Score 2    Pain Location Knee   Pain Orientation Left;Anterior   Pain Descriptors / Indicators Sore   Pain Type Surgical pain   Pain Onset More than a month ago   Pain Frequency Intermittent                         [facility] Adult PT Treatment/Exercise - 06/12/15 1118    Knee/Hip Exercises: Stretches   Passive Hamstring Stretch Left;3 reps;30 seconds   Quad Stretch Left;3 reps;30 seconds   Other Knee/Hip Stretches IT band stretch 3 reps 20-30 seconds.     Knee/Hip Exercises: Aerobic  Nustep L7 x 10 min; LE only   Knee/Hip Exercises: Standing   Forward Step Up Left;10 reps;Hand Hold: 0  onto BOSU   Functional Squat 2 sets;10 reps   Functional Squat Limitations BOSU   Cryotherapy   Number Minutes Cryotherapy 10 Minutes   Cryotherapy Location Knee   Type of Cryotherapy Ice pack                  PT Short Term Goals - 05/14/15 1426    PT SHORT TERM GOAL #1   Title she will be independent with inital HEP   Time 4   Period Weeks   Status Achieved   PT SHORT TERM GOAL #2   Title She will walk in home without device.    Time 4   Period Weeks   Status Achieved   PT SHORT TERM GOAL #3   Title She will report pain decreased 30% or more with transitional movements   Time 4   Period Weeks   Status Achieved   PT SHORT TERM GOAL #4   Title knees  extension improved to -5   Baseline -3   Time 4   Period Weeks   Status Achieved   PT SHORT TERM GOAL #5   Title Her active LT  knee flexion improved to 110 degrees   Baseline 112   Status Achieved           PT Long Term Goals - 06/12/15 1315    PT LONG TERM GOAL #1   Title She will be independetn with all HEP issued as of last visit   Status On-going   PT LONG TERM GOAL #2   Title She will report pain 1-2/10 with normal home and work activity   Status On-going   PT LONG TERM GOAL #3   Title She will walk with no device for normal home and work tasks   Status Achieved   PT [place] #4   Title She will be able to walk up and down 16 syteps with one rail step over step safely   Status On-going   PT LONG TERM GOAL #5   Title She will report able to stand for 60 min or more for work tasks.    Status On-going   PT LONG TERM GOAL #6   Title She will be able to ride bike at home for exercises   Status Achieved   PT LONG TERM GOAL #7   Title She will be able to sit for 45 min with 1-2 max pain.    Status On-going               Plan - 06/12/15 1314    Clinical Impression Statement Pt with increase soreness and pain after last session with standing activities, but overall mobility and function near baseline.  Pt ready to transition to home and community fitness next visit.   PT Next Visit Plan check goals and d/c PT; FOTO   Consulted and Agree with Plan of Care Patient        Problem List Patient Active Problem List   Diagnosis Date Noted  . Osteoarthritis of left knee 03/13/2015  . Thyroid nodule, cold 12/08/2010   Laureen Abrahams, PT, DPT 06/12/2015 1:17 PM  [facility] [facility] [address] [place], [place], [address] Phone: [phone]   Fax:  [phone]  Name: Sandy Sutton MRN: [id number] Date of Birth: Jan 22, 1956

## 2015-06-14 ENCOUNTER — Ambulatory Visit: Payer: BLUE CROSS/BLUE SHIELD | Admitting: Physical Therapy

## 2015-06-14 DIAGNOSIS — R600 Localized edema: Secondary | ICD-10-CM

## 2015-06-14 DIAGNOSIS — M25562 Pain in left knee: Secondary | ICD-10-CM

## 2015-06-14 DIAGNOSIS — M25662 Stiffness of left knee, not elsewhere classified: Secondary | ICD-10-CM

## 2015-06-14 DIAGNOSIS — R262 Difficulty in walking, not elsewhere classified: Secondary | ICD-10-CM

## 2015-06-14 DIAGNOSIS — R6889 Other general symptoms and signs: Secondary | ICD-10-CM

## 2015-06-14 DIAGNOSIS — R29898 Other symptoms and signs involving the musculoskeletal system: Secondary | ICD-10-CM

## 2015-06-14 NOTE — Therapy (Addendum)
[place], [place], [address] Phone: [phone]   Fax:  [phone]  Physical Therapy Treatment  Patient Details  Name: Sandy Sutton MRN: [id number] Date of Birth: [birth year] Referring Provider: Rodell Perna MD  Encounter Date: 06/14/2015      PT End of Session - 06/14/15 1022    Visit Number 23   Number of Visits 26   Date for PT Re-Evaluation 06/24/15   Authorization Type BCBS   PT Start Time 1017   PT Stop Time 1110   PT Time Calculation (min) 53 min      Past Medical History  Diagnosis Date  . Arthritis     hands and feet  . Pneumonia     hx of-at least 29yr ago  . History of bronchitis 2015  . History of migraine   . Weakness     right hand and pt states from pinched nerve  . Joint pain   . Joint swelling     bil knee and wrist  . Gout     hx of  . GERD (gastroesophageal reflux disease)     doesn't take any meds  . History of gastric ulcer   . History of colon polyps benign  . Urinary urgency   . Urinary frequency     Past Surgical History  Procedure Laterality Date  . Tubal ligation  1995  . Thyroid lobectomy Right 08/19/2012    Procedure: THYROID LOBECTOMY;  Surgeon: TEarnstine Regal MD;  Location: [facility];  Service: General;  Laterality: Right;  . Knee arthroscopy Left 2015  . Esophagogastroduodenoscopy    . Colonosscopy    . Knee arthroplasty Left 03/13/2015    Procedure: COMPUTER ASSISTED TOTAL KNEE ARTHROPLASTY; LEFT KNEE;  Surgeon: MMarybelle Killings MD;  [place]  Service: Orthopedics;  Laterality: Left;    There were no vitals filed for this visit.  Visit Diagnosis:  Stiffness of knee joint, left  Weakness of left leg  Activity intolerance  Difficulty walking  Left knee pain  Localized edema      Subjective Assessment - 06/14/15 1022    Subjective Sore but no pain   Currently in Pain? No/denies            [facility] - [facility]     Observation/Other Assessments   Focus on Therapeutic Outcomes (FOTO)  17% limited improved from 99% limited on eval   AROM   Left Knee Extension 3   Left Knee Flexion 115   PROM   Left Knee Extension 0   Left Knee Flexion 117                     [facility] Adult PT Treatment/Exercise - 06/14/15 0001    Ambulation/Gait   Stairs Yes   Stair Management Technique Alternating pattern;No rails   Number of Stairs 16   Height of Stairs 6   Gait Comments no difficulty   Knee/Hip Exercises: Aerobic   Elliptical Ramp 3 Level 1 x 20 minutes with target HR 95-127 bpm- pt maintained 132 bpm on elliptical but felt good.    Recumbent Bike L3 x 7 min   Vasopneumatic   Number Minutes Vasopneumatic  15 minutes   Vasopnuematic Location  Knee   Vasopneumatic Pressure Medium   Vasopneumatic Temperature  32                  PT Short Term Goals - 05/14/15  [place] #1   Title she will be independent with inital HEP   Time 4   Period Weeks   Status Achieved   PT SHORT TERM GOAL #2   Title She will walk in home without device.    Time 4   Period Weeks   Status Achieved   PT SHORT TERM GOAL #3   Title She will report pain decreased 30% or more with transitional movements   Time 4   Period Weeks   Status Achieved   PT SHORT TERM GOAL #4   Title knees  extension improved to -5   Baseline -3   Time 4   Period Weeks   Status Achieved   PT SHORT TERM GOAL #5   Title Her active LT knee flexion improved to 110 degrees   Baseline 112   Status Achieved           PT Long Term Goals - 06/14/15 1023    PT LONG TERM GOAL #1   Title She will be independetn with all HEP issued as of last visit   Time 12   Period Weeks   Status Achieved   PT LONG TERM GOAL #2   Title She will report pain 1-2/10 with normal home and work activity   Time 12   Period Weeks   Status Achieved   PT LONG TERM GOAL #3   Title She will walk with no device for normal home and  work tasks   Time 12   Period Weeks   Status Achieved   PT LONG TERM GOAL #4   Title She will be able to walk up and down 16 syteps with one rail step over step safely   Baseline no rails   Time 12   Period Weeks   Status Achieved   PT LONG TERM GOAL #5   Title She will report able to stand for 60 min or more for work tasks.    Time 12   Period Weeks   Status Achieved   PT LONG TERM GOAL #6   Title She will be able to ride bike at home for exercises   Time 12   Period Weeks   Status Achieved   PT LONG TERM GOAL #7   Title She will be able to sit for 45 min with 1-2 max pain.    Time 12   Period Weeks   Status Achieved               Plan - 06/14/15 1034    Clinical Impression Statement All gaosl met. Able to [place] 16 stairs without handrails safely. Worked on target heart rate zone training on elliptical for 20 minutes. She is ready for group fitness class here in 2 weeks. Independent with HEP.    PT Next Visit Plan discharge today        Problem List Patient Active Problem List   Diagnosis Date Noted  . Osteoarthritis of left knee 03/13/2015  . Thyroid nodule, cold 12/08/2010    [name], [place] 06/14/2015, 11:00 AM  [place], [place], [address] Phone: [phone]   Fax:  [phone]  Name: Sandy Sutton MRN: [id number] Date of Birth: 1956-04-08    PHYSICAL THERAPY DISCHARGE SUMMARY  Visits from Start of Care: 23  Current functional level related to goals / functional outcomes: See above goals   Remaining deficits: Continues with some  swelling and soreness   Education / Equipment: HEP and encouraged to join a post rehab program for exercise here or in community Plan: Patient agrees to discharge.  Patient goals were met. Patient is being discharged due to meeting the stated rehab goals.  ?????   [name], PT     07/05/15   12:19 PM

## 2016-03-06 ENCOUNTER — Encounter: Payer: Self-pay | Admitting: *Deleted

## 2016-07-28 DIAGNOSIS — D229 Melanocytic nevi, unspecified: Secondary | ICD-10-CM | POA: Diagnosis not present

## 2016-07-30 ENCOUNTER — Other Ambulatory Visit: Payer: Self-pay | Admitting: Dermatology

## 2016-07-30 DIAGNOSIS — L820 Inflamed seborrheic keratosis: Secondary | ICD-10-CM | POA: Diagnosis not present

## 2016-07-30 DIAGNOSIS — D485 Neoplasm of uncertain behavior of skin: Secondary | ICD-10-CM | POA: Diagnosis not present

## 2016-07-30 DIAGNOSIS — D2371 Other benign neoplasm of skin of right lower limb, including hip: Secondary | ICD-10-CM | POA: Diagnosis not present

## 2016-07-30 DIAGNOSIS — L821 Other seborrheic keratosis: Secondary | ICD-10-CM | POA: Diagnosis not present

## 2016-09-09 ENCOUNTER — Telehealth (INDEPENDENT_AMBULATORY_CARE_PROVIDER_SITE_OTHER): Payer: Self-pay | Admitting: Radiology

## 2016-09-09 NOTE — Telephone Encounter (Signed)
Joy calling from Dr. Pincus Large office asking if patients needs premed. Spoke with [place], no premed needed for dental procedure.

## 2016-09-10 ENCOUNTER — Telehealth (INDEPENDENT_AMBULATORY_CARE_PROVIDER_SITE_OTHER): Payer: Self-pay

## 2016-09-10 NOTE — Telephone Encounter (Signed)
Joy calling from Dr. Pincus Large office called again need order faxed to them saying pt does not need premeds. Saw last message and okay per MY/Betsy.   Faxed order.

## 2017-01-02 DIAGNOSIS — M62830 Muscle spasm of back: Secondary | ICD-10-CM | POA: Diagnosis not present

## 2017-01-04 ENCOUNTER — Ambulatory Visit
Admission: RE | Admit: 2017-01-04 | Discharge: 2017-01-04 | Disposition: A | Discharge status: Home / Self Care | Payer: BLUE CROSS/BLUE SHIELD | Source: Ambulatory Visit | Attending: Family Medicine | Admitting: Family Medicine

## 2017-01-04 ENCOUNTER — Other Ambulatory Visit: Payer: Self-pay | Admitting: Family Medicine

## 2017-01-04 DIAGNOSIS — R0781 Pleurodynia: Secondary | ICD-10-CM | POA: Diagnosis not present

## 2017-01-04 DIAGNOSIS — R079 Chest pain, unspecified: Secondary | ICD-10-CM | POA: Diagnosis not present

## 2017-01-04 DIAGNOSIS — M545 Low back pain: Secondary | ICD-10-CM

## 2017-01-12 DIAGNOSIS — M546 Pain in thoracic spine: Secondary | ICD-10-CM | POA: Diagnosis not present

## 2017-01-18 DIAGNOSIS — M62830 Muscle spasm of back: Secondary | ICD-10-CM | POA: Diagnosis not present

## 2017-01-21 DIAGNOSIS — M546 Pain in thoracic spine: Secondary | ICD-10-CM | POA: Diagnosis not present

## 2017-01-28 DIAGNOSIS — M546 Pain in thoracic spine: Secondary | ICD-10-CM | POA: Diagnosis not present

## 2017-02-03 DIAGNOSIS — M546 Pain in thoracic spine: Secondary | ICD-10-CM | POA: Diagnosis not present

## 2017-04-07 DIAGNOSIS — R51 Headache: Secondary | ICD-10-CM | POA: Diagnosis not present

## 2017-04-07 DIAGNOSIS — M542 Cervicalgia: Secondary | ICD-10-CM | POA: Diagnosis not present

## 2017-04-08 ENCOUNTER — Other Ambulatory Visit: Payer: Self-pay | Admitting: Family Medicine

## 2017-04-12 ENCOUNTER — Other Ambulatory Visit: Payer: Self-pay | Admitting: Family Medicine

## 2017-04-12 ENCOUNTER — Ambulatory Visit
Admission: RE | Admit: 2017-04-12 | Discharge: 2017-04-12 | Disposition: A | Discharge status: Home / Self Care | Payer: 59 | Source: Ambulatory Visit | Attending: Family Medicine | Admitting: Family Medicine

## 2017-04-12 DIAGNOSIS — G44309 Post-traumatic headache, unspecified, not intractable: Secondary | ICD-10-CM

## 2017-04-12 DIAGNOSIS — R51 Headache: Secondary | ICD-10-CM | POA: Diagnosis not present

## 2017-08-30 DIAGNOSIS — Z1211 Encounter for screening for malignant neoplasm of colon: Secondary | ICD-10-CM | POA: Diagnosis not present

## 2017-09-09 DIAGNOSIS — D122 Benign neoplasm of ascending colon: Secondary | ICD-10-CM | POA: Diagnosis not present

## 2017-09-09 DIAGNOSIS — Z1211 Encounter for screening for malignant neoplasm of colon: Secondary | ICD-10-CM | POA: Diagnosis not present

## 2017-09-09 DIAGNOSIS — Z86010 Personal history of colonic polyps: Secondary | ICD-10-CM | POA: Diagnosis not present

## 2017-09-09 DIAGNOSIS — D120 Benign neoplasm of cecum: Secondary | ICD-10-CM | POA: Diagnosis not present

## 2017-09-09 DIAGNOSIS — K635 Polyp of colon: Secondary | ICD-10-CM | POA: Diagnosis not present

## 2017-10-21 DIAGNOSIS — L309 Dermatitis, unspecified: Secondary | ICD-10-CM | POA: Diagnosis not present

## 2018-01-12 ENCOUNTER — Encounter (HOSPITAL_COMMUNITY): Payer: Self-pay

## 2018-01-12 ENCOUNTER — Ambulatory Visit (HOSPITAL_COMMUNITY)
Admission: EM | Admit: 2018-01-12 | Discharge: 2018-01-12 | Disposition: A | Discharge status: Home / Self Care | Payer: 59 | Attending: Internal Medicine | Admitting: Internal Medicine

## 2018-01-12 DIAGNOSIS — L03011 Cellulitis of right finger: Secondary | ICD-10-CM | POA: Diagnosis not present

## 2018-01-12 MED ORDER — CEPHALEXIN 500 MG PO CAPS: 500.0000 mg | ORAL_CAPSULE | Freq: Four times a day (QID) | ORAL | 0 refills | Status: AC

## 2018-01-12 NOTE — Discharge Instructions (Signed)
Soak finger in warm water with massage for further drainage  Begin Keflex every 6 hours  Follow up if symptoms not improving, worsening, numbness and tingling not improving

## 2018-01-12 NOTE — ED Triage Notes (Signed)
Pt presents with finger pain from finger injury of right ring finger

## 2018-01-13 NOTE — ED Provider Notes (Signed)
[place]    CSN: [id number] Arrival date & time: 01/12/18  1815     History   Chief Complaint Chief Complaint  Patient presents with  . Hand Pain    right ring finger    HPI Sandy Sutton is a 62 y.o. female no contributing past medical history presents today for evaluation of right ring finger pain and swelling.  She states that over the past week she has had worsening swelling around the nailbed.  She notes that one week ago she went to the nail salon and had her nails done.  She denies any drainage.  Has noted to have some numbness and tingling going into her finger more proximally, denies difficulty moving more proximal joints.  HPI  Past Medical History:  Diagnosis Date  . Arthritis    hands and feet  . GERD (gastroesophageal reflux disease)    doesn't take any meds  . Gout    hx of  . History of bronchitis 2015  . History of colon polyps benign  . History of gastric ulcer   . History of migraine   . Joint pain   . Joint swelling    bil knee and wrist  . Pneumonia    hx of-at least 28yrs ago  . Urinary frequency   . Urinary urgency   . Weakness    right hand and pt states from pinched nerve    Patient Active Problem List   Diagnosis Date Noted  . Osteoarthritis of left knee 03/13/2015  . Thyroid nodule, cold 12/08/2010    Past Surgical History:  Procedure Laterality Date  . Colonosscopy    . ESOPHAGOGASTRODUODENOSCOPY    . KNEE ARTHROPLASTY Left 03/13/2015   Procedure: COMPUTER ASSISTED TOTAL KNEE ARTHROPLASTY; LEFT KNEE;  Surgeon: [name], MD;  Location: [facility];  Service: Orthopedics;  Laterality: Left;  . KNEE ARTHROSCOPY Left 2015  . THYROID LOBECTOMY Right 08/19/2012   Procedure: THYROID LOBECTOMY;  Surgeon: [name], MD;  Location: [facility];  Service: General;  Laterality: Right;  . TUBAL LIGATION  1995    OB History   None      Home Medications    Prior to Admission medications   Medication Sig Start Date End Date  Taking? Authorizing Provider  amoxicillin (AMOXIL) 500 MG capsule Take 500 mg by mouth 3 (three) times daily.    [provider]  aspirin EC 325 MG EC tablet Take 1 tablet (325 mg total) by mouth daily. 03/15/15   Lanae Crumbly, PA-C  cephALEXin (KEFLEX) 500 MG capsule Take 1 capsule (500 mg total) by mouth 4 (four) times daily for 5 days. 01/12/18 01/17/18  Nasteho Glantz C, PA-C  fexofenadine-pseudoephedrine (ALLEGRA-D 24) 180-240 MG 24 hr tablet Take 1 tablet by mouth daily.    [provider]  HYDROcodone-acetaminophen (NORCO/VICODIN) 5-325 MG tablet Take 1 tablet by mouth every 6 (six) hours as needed for moderate pain.    [provider]  methocarbamol (ROBAXIN) 500 MG tablet Take 1 tablet (500 mg total) by mouth every 6 (six) hours as needed for muscle spasms. 03/15/15   Lanae Crumbly, PA-C  oxyCODONE-acetaminophen (PERCOCET) 7.5-325 MG tablet Take 1 tablet by mouth every 4 (four) hours as needed for severe pain. 03/15/15   Lanae Crumbly, PA-C    Family History Family History  Problem Relation Age of Onset  . Alzheimer's disease Father   . Cancer Mother   . Hypertension Sister   .  Breast cancer Sister   . Lupus Sister   . Cancer Brother     Social History Social History   Tobacco Use  . Smoking status: Never Smoker  . Smokeless tobacco: Never Used  Substance Use Topics  . Alcohol use: Yes    Comment: rare  . Drug use: No     Allergies   Darvon; Erythromycin; and Hydrocodone   Review of Systems Review of Systems  Constitutional: Negative for fatigue and fever.  Eyes: Negative for visual disturbance.  Respiratory: Negative for shortness of breath.   Cardiovascular: Negative for chest pain.  Gastrointestinal: Negative for abdominal pain, nausea and vomiting.  Musculoskeletal: Negative for arthralgias and joint swelling.  Skin: Positive for color change and wound. Negative for rash.  Neurological: Negative for dizziness, weakness,  light-headedness and headaches.     Physical Exam Triage Vital Signs ED Triage Vitals [01/12/18 1911]  Enc Vitals Group     BP (!) 110/58     Pulse Rate 85     Resp 19     Temp 98.7 F (37.1 C)     Temp Source Temporal     SpO2 99 %     Weight      Height      Head Circumference      Peak Flow      Pain Score      Pain Loc      Pain Edu?      Excl. in [place]?    No data found.  Updated Vital Signs BP (!) 110/58 (BP Location: Left Arm)   Pulse 85   Temp 98.7 F (37.1 C) (Temporal)   Resp 19   SpO2 99%   Visual Acuity Right Eye Distance:   Left Eye Distance:   Bilateral Distance:    Right Eye Near:   Left Eye Near:    Bilateral Near:     Physical Exam  Constitutional: She is oriented to person, place, and time. She appears well-developed and well-nourished.  No acute distress  HENT:  Head: Normocephalic and atraumatic.  Nose: Nose normal.  Eyes: Conjunctivae are normal.  Neck: Neck supple.  Cardiovascular: Normal rate.  Pulmonary/Chest: Effort normal. No respiratory distress.  Abdominal: She exhibits no distension.  Musculoskeletal: Normal range of motion.  Pain elicited with movement of DIP of right ring finger, but able to move, full active range of motion at PIP and metacarpophalangeal joint.  Neurological: She is alert and oriented to person, place, and time.  Skin: Skin is warm and dry.  Mild erythema and swelling surrounding right ring finger nailbed laterally, small area with pus below skin, nontender to palpation of distal finger pulp,  Psychiatric: She has a normal mood and affect.  Nursing note and vitals reviewed.    UC Treatments / Results  Labs (all labs ordered are listed, but only abnormal results are displayed) Labs Reviewed - No data to display  EKG None  Radiology No results found.  Procedures Incision and Drainage Date/Time: 01/13/2018 8:18 AM Performed by: Leotha Westermeyer, Elesa Hacker, PA-C Authorized by: Wynona Luna, MD    Consent:    Consent obtained:  Verbal   Consent given by:  Patient   Risks discussed:  Pain and incomplete drainage   Alternatives discussed:  No treatment and alternative treatment Location:    Indications for incision and drainage: Paronychia.   Location:  Upper extremity   Upper extremity location:  Finger   Finger location:  R ring finger Pre-procedure  details:    Skin preparation:  Betadine Anesthesia (see MAR for exact dosages):    Anesthesia method:  None Procedure type:    Complexity:  Simple Procedure details:    Incision types:  Stab incision   Incision depth:  Dermal   Scalpel blade:  11   Drainage:  Purulent   Drainage amount:  Scant   Wound treatment:  Wound left open   Packing materials:  None Post-procedure details:    Patient tolerance of procedure:  Tolerated well, no immediate complications   (including critical care time)  Medications Ordered in UC Medications - No data to display  Initial Impression / Assessment and Plan / UC Course  I have reviewed the triage vital signs and the nursing notes.  Pertinent labs & imaging results that were available during my care of the patient were reviewed by me and considered in my medical decision making (see chart for details).     Small amount of drainage expressed from small pustular area, discussed further management with soaks, will start on Keflex and warm compresses with massage.  Discussed strict return precautions. Patient verbalized understanding and is agreeable with plan.  Final Clinical Impressions(s) / UC Diagnoses   Final diagnoses:  Paronychia of right ring finger     Discharge Instructions     Soak finger in warm water with massage for further drainage  Begin Keflex every 6 hours  Follow up if symptoms not improving, worsening, numbness and tingling not improving   ED Prescriptions    Medication Sig Dispense Auth. Provider   cephALEXin (KEFLEX) 500 MG capsule Take 1 capsule (500 mg  total) by mouth 4 (four) times daily for 5 days. 20 capsule Eliya Bubar C, PA-C     Controlled Substance Prescriptions [place] Controlled Substance Registry consulted? Not Applicable,   Janith Lima, [place] 01/13/18 8118

## 2018-02-01 DIAGNOSIS — M25521 Pain in right elbow: Secondary | ICD-10-CM | POA: Diagnosis not present

## 2018-02-02 ENCOUNTER — Other Ambulatory Visit: Payer: 59

## 2018-02-02 ENCOUNTER — Other Ambulatory Visit: Payer: Self-pay | Admitting: Family Medicine

## 2018-02-02 ENCOUNTER — Ambulatory Visit
Admission: RE | Admit: 2018-02-02 | Discharge: 2018-02-02 | Disposition: A | Discharge status: Home / Self Care | Payer: 59 | Source: Ambulatory Visit | Attending: Family Medicine | Admitting: Family Medicine

## 2018-02-02 DIAGNOSIS — T1490XA Injury, unspecified, initial encounter: Secondary | ICD-10-CM

## 2018-02-02 DIAGNOSIS — M25511 Pain in right shoulder: Secondary | ICD-10-CM | POA: Diagnosis not present

## 2018-02-02 DIAGNOSIS — S4991XA Unspecified injury of right shoulder and upper arm, initial encounter: Secondary | ICD-10-CM | POA: Diagnosis not present

## 2018-02-08 DIAGNOSIS — J0100 Acute maxillary sinusitis, unspecified: Secondary | ICD-10-CM | POA: Diagnosis not present

## 2018-02-08 DIAGNOSIS — J029 Acute pharyngitis, unspecified: Secondary | ICD-10-CM | POA: Diagnosis not present

## 2018-02-08 DIAGNOSIS — R5383 Other fatigue: Secondary | ICD-10-CM | POA: Diagnosis not present

## 2018-05-24 DIAGNOSIS — I10 Essential (primary) hypertension: Secondary | ICD-10-CM | POA: Diagnosis not present

## 2018-05-24 DIAGNOSIS — E118 Type 2 diabetes mellitus with unspecified complications: Secondary | ICD-10-CM | POA: Diagnosis not present

## 2018-05-24 DIAGNOSIS — E785 Hyperlipidemia, unspecified: Secondary | ICD-10-CM | POA: Diagnosis not present

## 2018-05-26 DIAGNOSIS — Z0000 Encounter for general adult medical examination without abnormal findings: Secondary | ICD-10-CM | POA: Diagnosis not present

## 2018-05-30 DIAGNOSIS — Z1231 Encounter for screening mammogram for malignant neoplasm of breast: Secondary | ICD-10-CM | POA: Diagnosis not present

## 2018-06-24 DIAGNOSIS — Z780 Asymptomatic menopausal state: Secondary | ICD-10-CM | POA: Diagnosis not present

## 2018-06-24 DIAGNOSIS — Z96652 Presence of left artificial knee joint: Secondary | ICD-10-CM | POA: Diagnosis not present

## 2018-06-24 DIAGNOSIS — M810 Age-related osteoporosis without current pathological fracture: Secondary | ICD-10-CM | POA: Diagnosis not present

## 2019-05-10 DIAGNOSIS — Z20828 Contact with and (suspected) exposure to other viral communicable diseases: Secondary | ICD-10-CM | POA: Diagnosis not present

## 2019-05-29 DIAGNOSIS — H524 Presbyopia: Secondary | ICD-10-CM | POA: Diagnosis not present

## 2019-06-01 DIAGNOSIS — Z1231 Encounter for screening mammogram for malignant neoplasm of breast: Secondary | ICD-10-CM | POA: Diagnosis not present

## 2019-10-30 DIAGNOSIS — Z0120 Encounter for dental examination and cleaning without abnormal findings: Secondary | ICD-10-CM | POA: Diagnosis not present

## 2020-01-08 DIAGNOSIS — Z20822 Contact with and (suspected) exposure to covid-19: Secondary | ICD-10-CM | POA: Diagnosis not present

## 2020-02-07 DIAGNOSIS — M940 Chondrocostal junction syndrome [Tietze]: Secondary | ICD-10-CM | POA: Diagnosis not present

## 2020-02-07 DIAGNOSIS — K219 Gastro-esophageal reflux disease without esophagitis: Secondary | ICD-10-CM | POA: Diagnosis not present

## 2020-02-07 DIAGNOSIS — E6609 Other obesity due to excess calories: Secondary | ICD-10-CM | POA: Diagnosis not present

## 2020-02-07 DIAGNOSIS — R5383 Other fatigue: Secondary | ICD-10-CM | POA: Diagnosis not present

## 2020-03-24 ENCOUNTER — Ambulatory Visit (HOSPITAL_COMMUNITY)
Admission: EM | Admit: 2020-03-24 | Discharge: 2020-03-24 | Disposition: A | Discharge status: Home / Self Care | Payer: Medicare Other

## 2020-03-24 ENCOUNTER — Other Ambulatory Visit: Payer: Self-pay

## 2020-03-24 ENCOUNTER — Encounter (HOSPITAL_COMMUNITY): Payer: Self-pay

## 2020-03-24 DIAGNOSIS — M79602 Pain in left arm: Secondary | ICD-10-CM

## 2020-03-24 DIAGNOSIS — Z20822 Contact with and (suspected) exposure to covid-19: Secondary | ICD-10-CM | POA: Diagnosis not present

## 2020-03-24 DIAGNOSIS — Z03818 Encounter for observation for suspected exposure to other biological agents ruled out: Secondary | ICD-10-CM | POA: Diagnosis not present

## 2020-03-24 MED ORDER — METHOCARBAMOL 500 MG PO TABS: 500.0000 mg | ORAL_TABLET | Freq: Three times a day (TID) | ORAL | 0 refills | Status: DC | PRN

## 2020-03-24 MED ORDER — PREDNISONE 10 MG PO TABS: ORAL_TABLET | ORAL | 0 refills | Status: DC

## 2020-03-24 NOTE — ED Triage Notes (Addendum)
Pt reports having left arm pain and swelling x 1 day. States the pain radiates to left pinky finger and left middle finger. Pain is worse when moving the arm or shifting body.  Pt denies chest pain, abdominal pain, diarrheaany trauma, heavy lighting, new exercises.

## 2020-03-24 NOTE — ED Provider Notes (Signed)
[place]    CSN: [id number] Arrival date & time: 03/24/20  1056      History   Chief Complaint Chief Complaint  Patient presents with   Arm Pain    HPI Sandy Sutton is a 64 y.o. female.   Patient presenting today with acute onset left shoulder pain radiating down arm causing numbness and tingling in 4th and 5th digit since yesterday. States the pain is anterior almost into pectoral/underarm area. Denies known injury but did put together a printer tablet yesterday (states it was not heavy though). Denies CP, SOB, diaphoresis, N/V, hx of shoulder injuries or neck injuries. So far has taken tylenol arthritis without much relief but notes hot compress and bath did help temporarily.      Past Medical History:  Diagnosis Date   Arthritis    hands and feet   GERD (gastroesophageal reflux disease)    doesn't take any meds   Gout    hx of   History of bronchitis 2015   History of colon polyps benign   History of gastric ulcer    History of migraine    Joint pain    Joint swelling    bil knee and wrist   Pneumonia    hx of-at least 37yrs ago   Urinary frequency    Urinary urgency    Weakness    right hand and pt states from pinched nerve    Patient Active Problem List   Diagnosis Date Noted   Osteoarthritis of left knee 03/13/2015   Thyroid nodule, cold 12/08/2010    Past Surgical History:  Procedure Laterality Date   Colonosscopy     ESOPHAGOGASTRODUODENOSCOPY     KNEE ARTHROPLASTY Left 03/13/2015   Procedure: COMPUTER ASSISTED TOTAL KNEE ARTHROPLASTY; LEFT KNEE;  Surgeon: [name], MD;  Location: [facility];  Service: Orthopedics;  Laterality: Left;   KNEE ARTHROSCOPY Left 2015   THYROID LOBECTOMY Right 08/19/2012   Procedure: THYROID LOBECTOMY;  Surgeon: [name], MD;  Location: [facility];  Service: General;  Laterality: Right;   TUBAL LIGATION  1995    OB History   No obstetric history on file.      Home  Medications    Prior to Admission medications   Medication Sig Start Date End Date Taking? Authorizing Provider  amoxicillin (AMOXIL) 500 MG capsule Take 500 mg by mouth 3 (three) times daily.    [provider]  aspirin EC 325 MG EC tablet Take 1 tablet (325 mg total) by mouth daily. 03/15/15   Lanae Crumbly, PA-C  fexofenadine-pseudoephedrine (ALLEGRA-D 24) 180-240 MG 24 hr tablet Take 1 tablet by mouth daily.    [provider]  HYDROcodone-acetaminophen (NORCO/VICODIN) 5-325 MG tablet Take 1 tablet by mouth every 6 (six) hours as needed for moderate pain.    [provider]  ibuprofen (ADVIL) 400 MG tablet Take 400 mg by mouth every 4 (four) hours as needed. 03/19/20   [provider]  methocarbamol (ROBAXIN) 500 MG tablet Take 1 tablet (500 mg total) by mouth every 8 (eight) hours as needed for muscle spasms. DO NOT [place] ALCOHOL OR DRIVE WHILE TAKING THIS MEDICINE 03/24/20   Volney American, PA-C  oxyCODONE-acetaminophen (PERCOCET) 7.5-325 MG tablet Take 1 tablet by mouth every 4 (four) hours as needed for severe pain. 03/15/15   Lanae Crumbly, PA-C  predniSONE (DELTASONE) 10 MG tablet Take 6 tabs day one, 5 tabs day two, 4 tabs day three, etc  03/24/20   Volney American, PA-C    Family History Family History  Problem Relation Age of Onset   Alzheimer's disease Father    Cancer Mother    Hypertension Sister    Breast cancer Sister    Lupus Sister    Cancer Brother     Social History Social History   Tobacco Use   Smoking status: Never Smoker   Smokeless tobacco: Never Used  Substance Use Topics   Alcohol use: Yes    Comment: rare   Drug use: No     Allergies   Darvon, Propoxyphene, Erythromycin, and Hydrocodone   Review of Systems Review of Systems PER HPI    Physical Exam Triage Vital Signs ED Triage Vitals  Enc Vitals Group     BP 03/24/20 1129 110/67     Pulse Rate 03/24/20 1129 87     Resp  03/24/20 1129 17     Temp 03/24/20 1129 98.2 F (36.8 C)     Temp Source 03/24/20 1129 Oral     SpO2 03/24/20 1129 96 %     Weight --      Height --      Head Circumference --      Peak Flow --      Pain Score 03/24/20 1131 6     Pain Loc --      Pain Edu? --      Excl. in [place]? --    No data found.  Updated Vital Signs BP 110/67 (BP Location: Right Arm)    Pulse 87    Temp 98.2 F (36.8 C) (Oral)    Resp 17    SpO2 96%   Visual Acuity Right Eye Distance:   Left Eye Distance:   Bilateral Distance:    Right Eye Near:   Left Eye Near:    Bilateral Near:     Physical Exam Vitals and nursing note reviewed.  Constitutional:      Appearance: Normal appearance. She is not ill-appearing.  HENT:     Head: Atraumatic.  Eyes:     Extraocular Movements: Extraocular movements intact.     Conjunctiva/sclera: Conjunctivae normal.  Cardiovascular:     Rate and Rhythm: Normal rate and regular rhythm.     Pulses: Normal pulses.     Heart sounds: Normal heart sounds.  Pulmonary:     Effort: Pulmonary effort is normal.     Breath sounds: Normal breath sounds. No wheezing.  Musculoskeletal:        General: Tenderness (significant ttp anterior left shoulder into anterior and lateral pectoral muscle and down several inches into left bicep) present. No swelling, deformity or signs of injury. Normal range of motion.     Cervical back: Normal range of motion and neck supple.  Skin:    General: Skin is warm and dry.  Neurological:     Mental Status: She is alert and oriented to person, place, and time.     Sensory: No sensory deficit.     Motor: No weakness.  Psychiatric:        Mood and Affect: Mood normal.        Thought Content: Thought content normal.        Judgment: Judgment normal.      UC Treatments / Results  Labs (all labs ordered are listed, but only abnormal results are displayed) Labs Reviewed - No data to display  EKG   Radiology No results  found.  Procedures Procedures (including  critical care time)  Medications Ordered in UC Medications - No data to display  Initial Impression / Assessment and Plan / UC Course  I have reviewed the triage vital signs and the nursing notes.  Pertinent labs & imaging results that were available during my care of the patient were reviewed by me and considered in my medical decision making (see chart for details).     Low suspicion for blood clot or bony injury, suspect muscular strain that is causing nerve impingement/radicular sxs. She declines EKG today as she does not feel the issue is cardiac. Will tx with prednisone, robaxin, OTC pain relievers, and heat/massage. Strict return precautions reviewed at length.    Final Clinical Impressions(s) / UC Diagnoses   Final diagnoses:  Left arm pain   Discharge Instructions   None    ED Prescriptions    Medication Sig Dispense Auth. Provider   methocarbamol (ROBAXIN) 500 MG tablet  (Status: Discontinued) Take 1 tablet (500 mg total) by mouth every 8 (eight) hours as needed for muscle spasms. DO NOT DRINK ALCOHOL OR DRIVE WHILE TAKING THIS MEDICINE 30 tablet Volney American, PA-C   predniSONE (DELTASONE) 10 MG tablet  (Status: Discontinued) Take 6 tabs day one, 5 tabs day two, 4 tabs day three, etc 21 tablet Volney American, PA-C   methocarbamol (ROBAXIN) 500 MG tablet Take 1 tablet (500 mg total) by mouth every 8 (eight) hours as needed for muscle spasms. DO NOT DRINK ALCOHOL OR DRIVE WHILE TAKING THIS MEDICINE 30 tablet Volney American, PA-C   predniSONE (DELTASONE) 10 MG tablet Take 6 tabs day one, 5 tabs day two, 4 tabs day three, etc 21 tablet Volney American, [place]     PDMP not reviewed this encounter.   Volney American, [place] 03/24/20 1230

## 2020-06-06 DIAGNOSIS — Z1231 Encounter for screening mammogram for malignant neoplasm of breast: Secondary | ICD-10-CM | POA: Diagnosis not present

## 2020-06-06 DIAGNOSIS — Z803 Family history of malignant neoplasm of breast: Secondary | ICD-10-CM | POA: Diagnosis not present

## 2020-06-12 DIAGNOSIS — Z20822 Contact with and (suspected) exposure to covid-19: Secondary | ICD-10-CM | POA: Diagnosis not present

## 2020-06-19 DIAGNOSIS — J320 Chronic maxillary sinusitis: Secondary | ICD-10-CM | POA: Diagnosis not present

## 2020-06-24 DIAGNOSIS — H524 Presbyopia: Secondary | ICD-10-CM | POA: Diagnosis not present

## 2020-09-17 DIAGNOSIS — Z0000 Encounter for general adult medical examination without abnormal findings: Secondary | ICD-10-CM | POA: Diagnosis not present

## 2020-10-17 DIAGNOSIS — R112 Nausea with vomiting, unspecified: Secondary | ICD-10-CM | POA: Diagnosis not present

## 2020-10-17 DIAGNOSIS — M5459 Other low back pain: Secondary | ICD-10-CM | POA: Diagnosis not present

## 2020-10-17 DIAGNOSIS — Z7189 Other specified counseling: Secondary | ICD-10-CM | POA: Diagnosis not present

## 2020-10-17 DIAGNOSIS — R10816 Epigastric abdominal tenderness: Secondary | ICD-10-CM | POA: Diagnosis not present

## 2020-10-22 ENCOUNTER — Other Ambulatory Visit: Payer: Self-pay | Admitting: Nurse Practitioner

## 2020-10-22 DIAGNOSIS — R10816 Epigastric abdominal tenderness: Secondary | ICD-10-CM | POA: Diagnosis not present

## 2020-10-22 DIAGNOSIS — Z7189 Other specified counseling: Secondary | ICD-10-CM | POA: Diagnosis not present

## 2020-10-22 DIAGNOSIS — R10826 Epigastric rebound abdominal tenderness: Secondary | ICD-10-CM

## 2020-10-22 DIAGNOSIS — M5459 Other low back pain: Secondary | ICD-10-CM | POA: Diagnosis not present

## 2020-10-22 DIAGNOSIS — R112 Nausea with vomiting, unspecified: Secondary | ICD-10-CM

## 2020-10-24 ENCOUNTER — Encounter (HOSPITAL_COMMUNITY)
Admission: EM | Disposition: A | Discharge status: Home / Self Care | Payer: Self-pay | Source: Home / Self Care | Attending: Family Medicine

## 2020-10-24 ENCOUNTER — Inpatient Hospital Stay (HOSPITAL_COMMUNITY)
Admission: EM | Admit: 2020-10-24 | Discharge: 2020-10-29 | DRG: 854 | Disposition: A | Discharge status: Home / Self Care | Payer: Medicare Other | Attending: Family Medicine | Admitting: Family Medicine

## 2020-10-24 ENCOUNTER — Other Ambulatory Visit: Payer: Self-pay

## 2020-10-24 ENCOUNTER — Emergency Department (HOSPITAL_COMMUNITY): Payer: Medicare Other

## 2020-10-24 ENCOUNTER — Inpatient Hospital Stay (HOSPITAL_COMMUNITY): Payer: Medicare Other | Admitting: Registered Nurse

## 2020-10-24 DIAGNOSIS — Z7952 Long term (current) use of systemic steroids: Secondary | ICD-10-CM

## 2020-10-24 DIAGNOSIS — R197 Diarrhea, unspecified: Secondary | ICD-10-CM | POA: Diagnosis present

## 2020-10-24 DIAGNOSIS — A4151 Sepsis due to Escherichia coli [E. coli]: Principal | ICD-10-CM | POA: Diagnosis present

## 2020-10-24 DIAGNOSIS — R609 Edema, unspecified: Secondary | ICD-10-CM | POA: Diagnosis present

## 2020-10-24 DIAGNOSIS — Z888 Allergy status to other drugs, medicaments and biological substances status: Secondary | ICD-10-CM | POA: Diagnosis not present

## 2020-10-24 DIAGNOSIS — Z881 Allergy status to other antibiotic agents status: Secondary | ICD-10-CM | POA: Diagnosis not present

## 2020-10-24 DIAGNOSIS — M109 Gout, unspecified: Secondary | ICD-10-CM | POA: Diagnosis present

## 2020-10-24 DIAGNOSIS — D649 Anemia, unspecified: Secondary | ICD-10-CM | POA: Diagnosis present

## 2020-10-24 DIAGNOSIS — N390 Urinary tract infection, site not specified: Secondary | ICD-10-CM | POA: Diagnosis present

## 2020-10-24 DIAGNOSIS — K219 Gastro-esophageal reflux disease without esophagitis: Secondary | ICD-10-CM | POA: Diagnosis not present

## 2020-10-24 DIAGNOSIS — Z419 Encounter for procedure for purposes other than remedying health state, unspecified: Secondary | ICD-10-CM

## 2020-10-24 DIAGNOSIS — Z8249 Family history of ischemic heart disease and other diseases of the circulatory system: Secondary | ICD-10-CM | POA: Diagnosis not present

## 2020-10-24 DIAGNOSIS — Z885 Allergy status to narcotic agent status: Secondary | ICD-10-CM

## 2020-10-24 DIAGNOSIS — N136 Pyonephrosis: Secondary | ICD-10-CM | POA: Diagnosis present

## 2020-10-24 DIAGNOSIS — N202 Calculus of kidney with calculus of ureter: Secondary | ICD-10-CM | POA: Diagnosis present

## 2020-10-24 DIAGNOSIS — R10816 Epigastric abdominal tenderness: Secondary | ICD-10-CM | POA: Diagnosis not present

## 2020-10-24 DIAGNOSIS — Z832 Family history of diseases of the blood and blood-forming organs and certain disorders involving the immune mechanism: Secondary | ICD-10-CM | POA: Diagnosis not present

## 2020-10-24 DIAGNOSIS — Z820 Family history of epilepsy and other diseases of the nervous system: Secondary | ICD-10-CM | POA: Diagnosis not present

## 2020-10-24 DIAGNOSIS — N179 Acute kidney failure, unspecified: Secondary | ICD-10-CM | POA: Diagnosis not present

## 2020-10-24 DIAGNOSIS — N200 Calculus of kidney: Secondary | ICD-10-CM

## 2020-10-24 DIAGNOSIS — Z7982 Long term (current) use of aspirin: Secondary | ICD-10-CM

## 2020-10-24 DIAGNOSIS — Z79899 Other long term (current) drug therapy: Secondary | ICD-10-CM

## 2020-10-24 DIAGNOSIS — A419 Sepsis, unspecified organism: Secondary | ICD-10-CM | POA: Diagnosis present

## 2020-10-24 DIAGNOSIS — Z86010 Personal history of colonic polyps: Secondary | ICD-10-CM

## 2020-10-24 DIAGNOSIS — Z9689 Presence of other specified functional implants: Secondary | ICD-10-CM | POA: Diagnosis not present

## 2020-10-24 DIAGNOSIS — Z96652 Presence of left artificial knee joint: Secondary | ICD-10-CM | POA: Diagnosis not present

## 2020-10-24 DIAGNOSIS — Z20822 Contact with and (suspected) exposure to covid-19: Secondary | ICD-10-CM | POA: Diagnosis not present

## 2020-10-24 DIAGNOSIS — M5459 Other low back pain: Secondary | ICD-10-CM | POA: Diagnosis not present

## 2020-10-24 DIAGNOSIS — N3000 Acute cystitis without hematuria: Secondary | ICD-10-CM

## 2020-10-24 DIAGNOSIS — Z8711 Personal history of peptic ulcer disease: Secondary | ICD-10-CM

## 2020-10-24 DIAGNOSIS — Z803 Family history of malignant neoplasm of breast: Secondary | ICD-10-CM

## 2020-10-24 DIAGNOSIS — R5383 Other fatigue: Secondary | ICD-10-CM | POA: Diagnosis not present

## 2020-10-24 DIAGNOSIS — N201 Calculus of ureter: Secondary | ICD-10-CM | POA: Diagnosis present

## 2020-10-24 DIAGNOSIS — R112 Nausea with vomiting, unspecified: Secondary | ICD-10-CM | POA: Diagnosis not present

## 2020-10-24 HISTORY — PX: CYSTOSCOPY W/ URETERAL STENT PLACEMENT: SHX1429

## 2020-10-24 LAB — CBC WITH DIFFERENTIAL/PLATELET
Abs Immature Granulocytes: 0 10*3/uL (ref 0.00–0.07)
Basophils Absolute: 0.4 10*3/uL — ABNORMAL HIGH (ref 0.0–0.1)
Basophils Relative: 1 %
Eosinophils Absolute: 0 10*3/uL (ref 0.0–0.5)
Eosinophils Relative: 0 %
HCT: 33.5 % — ABNORMAL LOW (ref 36.0–46.0)
Hemoglobin: 10.6 g/dL — ABNORMAL LOW (ref 12.0–15.0)
Lymphocytes Relative: 4 %
Lymphs Abs: 1.6 10*3/uL (ref 0.7–4.0)
MCH: 29.2 pg (ref 26.0–34.0)
MCHC: 31.6 g/dL (ref 30.0–36.0)
MCV: 92.3 fL (ref 80.0–100.0)
Monocytes Absolute: 1.2 10*3/uL — ABNORMAL HIGH (ref 0.1–1.0)
Monocytes Relative: 3 %
Neutro Abs: 37.4 10*3/uL — ABNORMAL HIGH (ref 1.7–7.7)
Neutrophils Relative %: 92 %
Platelets: 363 10*3/uL (ref 150–400)
RBC: 3.63 MIL/uL — ABNORMAL LOW (ref 3.87–5.11)
RDW: 13.3 % (ref 11.5–15.5)
WBC: 40.6 10*3/uL — ABNORMAL HIGH (ref 4.0–10.5)
nRBC: 0 % (ref 0.0–0.2)
nRBC: 0 /100 WBC

## 2020-10-24 LAB — COMPREHENSIVE METABOLIC PANEL
ALT: 28 U/L (ref 0–44)
AST: 29 U/L (ref 15–41)
Albumin: 2.8 g/dL — ABNORMAL LOW (ref 3.5–5.0)
Alkaline Phosphatase: 116 U/L (ref 38–126)
Anion gap: 9 (ref 5–15)
BUN: 27 mg/dL — ABNORMAL HIGH (ref 8–23)
CO2: 25 mmol/L (ref 22–32)
Calcium: 8.9 mg/dL (ref 8.9–10.3)
Chloride: 98 mmol/L (ref 98–111)
Creatinine, Ser: 1.55 mg/dL — ABNORMAL HIGH (ref 0.44–1.00)
GFR, Estimated: 37 mL/min — ABNORMAL LOW (ref >60)
Glucose, Bld: 102 mg/dL — ABNORMAL HIGH (ref 70–99)
Potassium: 4 mmol/L (ref 3.5–5.1)
Sodium: 132 mmol/L — ABNORMAL LOW (ref 135–145)
Total Bilirubin: 1.3 mg/dL — ABNORMAL HIGH (ref 0.3–1.2)
Total Protein: 7.9 g/dL (ref 6.5–8.1)

## 2020-10-24 LAB — URINALYSIS, ROUTINE W REFLEX MICROSCOPIC
Bilirubin Urine: NEGATIVE
Glucose, UA: NEGATIVE mg/dL
Ketones, ur: 20 mg/dL — AB
Nitrite: NEGATIVE
Protein, ur: 100 mg/dL — AB
Specific Gravity, Urine: 1.02 (ref 1.005–1.030)
WBC, UA: >50 WBC/hpf — ABNORMAL HIGH (ref 0–5)
pH: 5 (ref 5.0–8.0)

## 2020-10-24 LAB — RESP PANEL BY RT-PCR (FLU A&B, COVID) ARPGX2
Influenza A by PCR: NEGATIVE
Influenza B by PCR: NEGATIVE
SARS Coronavirus 2 by RT PCR: NEGATIVE

## 2020-10-24 LAB — LIPASE, BLOOD: Lipase: 24 U/L (ref 11–51)

## 2020-10-24 LAB — POC SARS CORONAVIRUS 2 AG -  ED: SARSCOV2ONAVIRUS 2 AG: NEGATIVE

## 2020-10-24 LAB — LACTIC ACID, PLASMA: Lactic Acid, Venous: 1 mmol/L (ref 0.5–1.9)

## 2020-10-24 SURGERY — CYSTOSCOPY, WITH RETROGRADE PYELOGRAM AND URETERAL STENT INSERTION
Anesthesia: General | Site: Bladder | Laterality: Right

## 2020-10-24 MED ORDER — SODIUM CHLORIDE 0.9 % IV BOLUS
1000.0000 mL | Freq: Once | INTRAVENOUS | Status: AC
  Administered 2020-10-24: 1000 mL via INTRAVENOUS

## 2020-10-24 MED ORDER — SODIUM CHLORIDE 0.9 % IR SOLN
Status: DC | PRN
  Administered 2020-10-24: 3000 mL via INTRAVESICAL

## 2020-10-24 MED ORDER — IOHEXOL 300 MG/ML  SOLN
INTRAMUSCULAR | Status: DC | PRN
  Administered 2020-10-24: 3 mL via URETHRAL

## 2020-10-24 MED ORDER — ONDANSETRON HCL 4 MG/2ML IJ SOLN
4.0000 mg | Freq: Once | INTRAMUSCULAR | Status: AC
  Administered 2020-10-24: 4 mg via INTRAVENOUS
  Filled 2020-10-24: qty 2

## 2020-10-24 MED ORDER — SODIUM CHLORIDE 0.9 % IV SOLN
2.0000 g | Freq: Once | INTRAVENOUS | Status: AC
  Administered 2020-10-24: 2 g via INTRAVENOUS
  Filled 2020-10-24: qty 20

## 2020-10-24 MED ORDER — MIDAZOLAM HCL 5 MG/5ML IJ SOLN
INTRAMUSCULAR | Status: DC | PRN
  Administered 2020-10-24: 2 mg via INTRAVENOUS

## 2020-10-24 MED ORDER — HYDROMORPHONE HCL 1 MG/ML IJ SOLN
0.5000 mg | Freq: Once | INTRAMUSCULAR | Status: AC
  Administered 2020-10-24: 0.5 mg via INTRAVENOUS
  Filled 2020-10-24: qty 1

## 2020-10-24 SURGICAL SUPPLY — 20 items
BAG URINE DRAIN 2000ML AR STRL (UROLOGICAL SUPPLIES) ×2 IMPLANT
BAG URO CATCHER STRL LF (MISCELLANEOUS) ×2 IMPLANT
CATH FOLEY 2WAY 5CC 16FR (CATHETERS) ×2
CATH INTERMIT  6FR 70CM (CATHETERS) IMPLANT
CATH URET 5FR 28IN OPEN ENDED (CATHETERS) ×1 IMPLANT
CATH URTH STD 16FR FL 2W DRN (CATHETERS) ×1 IMPLANT
GLOVE BIOGEL M 7.0 STRL (GLOVE) ×2 IMPLANT
GOWN STRL REUS W/TWL LRG LVL3 (GOWN DISPOSABLE) ×2 IMPLANT
GUIDEWIRE ANG ZIPWIRE 038X150 (WIRE) ×1 IMPLANT
GUIDEWIRE STR DUAL SENSOR (WIRE) ×3 IMPLANT
IV CATH 14GX2 1/4 (CATHETERS) IMPLANT
IV NS 1000ML (IV SOLUTION) ×2
IV NS 1000ML BAXH (IV SOLUTION) ×1 IMPLANT
KIT TURNOVER KIT B (KITS) ×1 IMPLANT
MANIFOLD NEPTUNE II (INSTRUMENTS) ×2 IMPLANT
PACK CYSTO (CUSTOM PROCEDURE TRAY) ×2 IMPLANT
STENT CONTOUR 6FRX24X.038 (STENTS) IMPLANT
STENT CONTOUR 6FRX26X.038 (STENTS) ×2 IMPLANT
SYR 10ML LL (SYRINGE) ×1 IMPLANT
TUBING CONNECTING 10 (TUBING) ×2 IMPLANT

## 2020-10-24 NOTE — ED Triage Notes (Addendum)
Pt presents to triage from home with shortness of breath, back pain, abdominal pain, diarrhea, and vomiting x 2 weeks. Pt was seen at MD office and was sent to ED.

## 2020-10-24 NOTE — Op Note (Signed)
Operative Note  Preoperative diagnosis:  1.  Right renal pelvis stone 2. Urinary tract infection 3. Sepsis 4. Acute kidney injury  Postoperative diagnosis: 1.  Right renal pelvis stone 2. Urinary tract infection 3. Sepsis 4. Acute kidney injury  Procedure(s): 1.  Cystoscopy 2. Right retrograde pyelogram with interpretation 3. Right ureteral stent placement 4. Fluoroscopy <1 hour with intraoperative interpretation  Surgeon: Rexene Alberts, MD  Assistants:  None  Anesthesia:  General  Complications:  None  EBL:  Minimal  Specimens: 1. None  Drains/Catheters: 1.  6Fr x 26cm right ureteral stent  Intraoperative findings:   1. Cystoscopy demonstrated no suspicious lesions, masses, stones or other pathology. 2. Right retrograde pyelogram demonstrated mild fullness of collecting system. Large right renal stone in renal pelvis 3. Successful right ureteral stent placement with curl in the right upper pole and bladder respectively.  Indication:  Sandy Sutton is a 65 y.o. female with a 2 week history of right sided abdominal pain, nausea, emesis and fevers. CT A/P 10/24/2020 demonstrated 9mm R UPJ stone with sign of infection and sepsis. After reviewing the management options for treatment, she elected to proceed with the above surgical procedure(s). We have discussed the potential benefits and risks of the procedure, side effects of the proposed treatment, the likelihood of the patient achieving the goals of the procedure, and any potential problems that might occur during the procedure or recuperation. Informed consent has been obtained.  Description of procedure: The patient was taken to the operating room and general anesthesia was induced.  The patient was placed in the dorsal lithotomy position, prepped and draped in the usual sterile fashion, and preoperative antibiotics were administered. A preoperative time-out was performed.   Cystourethroscopy was performed.  The  patient's urethra was examined and was normal. The bladder was then systematically examined in its entirety. There was no evidence for any bladder tumors, stones, or other mucosal pathology.    Attention then turned to the right ureteral orifice. A 0.038 zip wire was passed through the right orifice and over the wire a 5 Fr open ended catheter was inserted and passed up to the level of the renal pelvis. I was unable to obtain aspirate. There was no hydronephrotic drip. Omnipaque contrast was injected through the ureteral catheter and a retrograde pyelogram was performed with findings as dictated above. The wire was then replaced and the open ended catheter was removed.   A 6Fr x 26cm ureteral stent was advance over the wire. The stent was positioned appropriately under fluoroscopic and cystoscopic guidance.  The wire was then removed with an adequate stent curl noted in the right upper pole as well as in the bladder.  The bladder was then emptied and the procedure ended.  The patient appeared to tolerate the procedure well and without complications.  The patient was able to be awakened and transferred to the recovery unit in satisfactory condition.   Plan:  Admit to medicine. Continue broad spectrum antibiotics. F/u urine culture. Will need to be discharged home on a course of culture specific abx outpatient. I messaged schedulers to arrange for followup.  Matt R. [facility]  Pager: [phone]

## 2020-10-24 NOTE — Progress Notes (Signed)
1 Day Post-Op Subjective: Pain controlled, no nausea or emesis.  T-max 100, downtrended to 97.9.   Objective: Vital signs in last 24 hours: Temp:  [97.9 F (36.6 C)-100 F (37.8 C)] 97.9 F (36.6 C) (05/13 0504) Pulse Rate:  [79-112] 79 (05/13 0504) Resp:  [16-25] 16 (05/13 0504) BP: (105-124)/(52-65) 105/55 (05/13 0504) SpO2:  [95 %-100 %] 97 % (05/13 0504) Weight:  [73.9 kg-75.8 kg] 75.8 kg (05/13 0157)  Intake/Output from previous day: 05/12 0701 - 05/13 0700 In: 1250 [I.V.:1000; IV Piggyback:250] Out: 100 [Urine:100] Intake/Output this shift: Total I/O In: 1250 [I.V.:1000; IV Piggyback:250] Out: 100 [Urine:100]   UOP: Poorly recorded, yellow in bag  Physical Exam:  General: Alert and oriented CV: RRR Lungs: Clear Abdomen: Soft, ND, NT Ext: NT, No erythema  Lab Results: Recent Labs    10/24/20 1543 10/25/20 0549  HGB 10.6* 9.4*  HCT 33.5* 29.0*   BMET Recent Labs    10/24/20 1543  NA 132*  K 4.0  CL 98  CO2 25  GLUCOSE 102*  BUN 27*  CREATININE 1.55*  CALCIUM 8.9     Studies/Results: CT ABDOMEN PELVIS WO CONTRAST  Result Date: 10/24/2020 CLINICAL DATA:  65 year old female with abdominal pain and fever. EXAM: CT ABDOMEN AND PELVIS WITHOUT CONTRAST TECHNIQUE: Multidetector CT imaging of the abdomen and pelvis was performed following the standard protocol without IV contrast. COMPARISON:  CT abdomen pelvis dated 09/08/2010. FINDINGS: Evaluation of this exam is limited in the absence of intravenous contrast. Lower chest: Minimal bibasilar linear atelectasis/scarring. The visualized lung bases are otherwise clear. No intra-abdominal free air or free fluid. Hepatobiliary: Small calcific focus over the left lobe of the liver. The liver is otherwise unremarkable. No intrahepatic biliary dilatation. The gallbladder is unremarkable. Pancreas: Unremarkable. No pancreatic ductal dilatation or surrounding inflammatory changes. Spleen: Normal in size without focal  abnormality. Adrenals/Urinary Tract: The adrenal glands unremarkable. There is a 9 mm stone in the right renal pelvis at the ureteropelvic junction. There is mild fullness of the right renal collecting system with mild right perinephric stranding. The left kidney, left ureter, and urinary bladder appear unremarkable. Stomach/Bowel: There is no bowel obstruction or active inflammation. The appendix is normal. Vascular/Lymphatic: Mild atherosclerotic calcification of the abdominal aorta. The IVC is unremarkable. No portal venous gas. There is no adenopathy. Reproductive: The uterus is anteverted and grossly unremarkable. No adnexal masses Other: None Musculoskeletal: No acute or significant osseous findings. IMPRESSION: 1. A 9 mm right UPJ stone with mild fullness of the right renal collecting system. 2. No bowel obstruction. Normal appendix. 3. Aortic Atherosclerosis (ICD10-I70.0). Electronically Signed   By: Anner Crete M.D.   On: 10/24/2020 20:51   DG Retrograde Pyelogram  Result Date: 10/25/2020 CLINICAL DATA:  65 year old female with retrograde pyelogram and right sided stent. EXAM: RETROGRADE PYELOGRAM COMPARISON:  None. FINDINGS: Four intraoperative fluoroscopic images provided. The total fluoroscopic time is 26 seconds. A right-sided ureteral stent is noted. Contrast injected through the stent opacifies the renal collecting system. IMPRESSION: Right ureteral stent placement. Electronically Signed   By: Anner Crete M.D.   On: 10/25/2020 01:11    Assessment/Plan: 1. Right UPJ stone: Measures 77mm on CT A/P 10/24/20 2. Sepsis due to obstructing UPJ stone: Febrile at home, leukocytosis at 40.6, UA with neg nitrite, large LE, Creatinine 1.55 from baseline 1 3. AKI: due to obstructed renal stone as above  -Continue broad-spectrum antibiotics -Follow-up urine culture -Will need culture specific antibiotics upon discharge -Leukocytosis downtrending to 28  from 40 -F/u creatinine -Void trial  when she has been afebrile for 24 hours -I have messaged schedulers to arrange for outpatient follow-up   LOS: 1 day   Matt R. Jamol Ginyard MD 10/25/2020, 6:33 AM [facility]  Pager: [phone]

## 2020-10-24 NOTE — ED Provider Notes (Signed)
[facility] Provider Note   CSN: [id number] Arrival date & time: 10/24/20  1505     History Chief Complaint  Patient presents with  . Emesis    Sandy Sutton is a 65 y.o. female possible history of GERD, pneumonia who presents today for evaluation of 2 weeks of abdominal pain, nausea/vomiting/diarrhea.  She reports that about 2 weeks ago, she noted some mild abdominal pain.  She also was not feeling well so she saw her primary care doctor and they started her on some antibiotics for a sinus infection.  She did not get any steroids at that time.  She states that she would feel nauseous but she feels like about 4 to 5 days ago is when the symptoms really started to get worse.  She states that since then she has had constant nausea/vomiting and has not been able to tolerate any p.o.  She states her pain intensified about 3 days ago starts in the right flank and radiates into the right abdomen.  She has noted a fever at home as high as 101.5.  She has still been urinating and denies any dysuria, hematuria.  She denies any chest pain, difficulty breathing.  The history is provided by the patient.       Past Medical History:  Diagnosis Date  . Arthritis    hands and feet  . GERD (gastroesophageal reflux disease)    doesn't take any meds  . Gout    hx of  . History of bronchitis 2015  . History of colon polyps benign  . History of gastric ulcer   . History of migraine   . Joint pain   . Joint swelling    bil knee and wrist  . Pneumonia    hx of-at least 48yrs ago  . Urinary frequency   . Urinary urgency   . Weakness    right hand and pt states from pinched nerve    Patient Active Problem List   Diagnosis Date Noted  . Complicated UTI (urinary tract infection) 10/24/2020  . Osteoarthritis of left knee 03/13/2015  . Thyroid nodule, cold 12/08/2010    Past Surgical History:  Procedure Laterality Date  . Colonosscopy    .  ESOPHAGOGASTRODUODENOSCOPY    . KNEE ARTHROPLASTY Left 03/13/2015   Procedure: COMPUTER ASSISTED TOTAL KNEE ARTHROPLASTY; LEFT KNEE;  Surgeon: [name], MD;  Location: [facility];  Service: Orthopedics;  Laterality: Left;  . KNEE ARTHROSCOPY Left 2015  . THYROID LOBECTOMY Right 08/19/2012   Procedure: THYROID LOBECTOMY;  Surgeon: [name], MD;  Location: [facility];  Service: General;  Laterality: Right;  . TUBAL LIGATION  1995     OB History   No obstetric history on file.     Family History  Problem Relation Age of Onset  . Alzheimer's disease Father   . Cancer Mother   . Hypertension Sister   . Breast cancer Sister   . Lupus Sister   . Cancer Brother     Social History   Tobacco Use  . Smoking status: Never Smoker  . Smokeless tobacco: Never Used  Substance Use Topics  . Alcohol use: Yes    Comment: rare  . Drug use: No    Home Medications Prior to Admission medications   Medication Sig Start Date End Date Taking? Authorizing Provider  amoxicillin (AMOXIL) 500 MG capsule Take 500 mg by mouth 3 (three) times daily.    [provider]  aspirin  EC 325 MG EC tablet Take 1 tablet (325 mg total) by mouth daily. 03/15/15   Lanae Crumbly, PA-C  fexofenadine-pseudoephedrine (ALLEGRA-D 24) 180-240 MG 24 hr tablet Take 1 tablet by mouth daily.    [provider]  HYDROcodone-acetaminophen (NORCO/VICODIN) 5-325 MG tablet Take 1 tablet by mouth every 6 (six) hours as needed for moderate pain.    [provider]  ibuprofen (ADVIL) 400 MG tablet Take 400 mg by mouth every 4 (four) hours as needed. 03/19/20   [provider]  methocarbamol (ROBAXIN) 500 MG tablet Take 1 tablet (500 mg total) by mouth every 8 (eight) hours as needed for muscle spasms. DO NOT [place] ALCOHOL OR DRIVE WHILE TAKING THIS MEDICINE 03/24/20   Volney American, PA-C  oxyCODONE-acetaminophen (PERCOCET) 7.5-325 MG tablet Take 1 tablet by mouth every 4 (four) hours as needed  for severe pain. 03/15/15   Lanae Crumbly, PA-C  predniSONE (DELTASONE) 10 MG tablet Take 6 tabs day one, 5 tabs day two, 4 tabs day three, etc 03/24/20   Volney American, PA-C    Allergies    Darvon, Propoxyphene, Erythromycin, and Hydrocodone  Review of Systems   Review of Systems  Constitutional: Negative for fever.  Respiratory: Negative for cough and shortness of breath.   Cardiovascular: Negative for chest pain.  Gastrointestinal: Positive for abdominal pain, diarrhea, nausea and vomiting.  Genitourinary: Negative for dysuria and hematuria.  Neurological: Negative for headaches.  All other systems reviewed and are negative.   Physical Exam Updated Vital Signs BP (!) 121/57   Pulse (!) 103   Temp 100 F (37.8 C) (Oral)   Resp 17   Ht 5' 2.5" (1.588 m)   Wt 73.9 kg   SpO2 97%   BMI 29.34 kg/m   Physical Exam Vitals and nursing note reviewed.  Constitutional:      Appearance: Normal appearance. She is well-developed. She is ill-appearing.  HENT:     Head: Normocephalic and atraumatic.  Eyes:     General: Lids are normal.     Conjunctiva/sclera: Conjunctivae normal.     Pupils: Pupils are equal, round, and reactive to light.  Cardiovascular:     Rate and Rhythm: Normal rate and regular rhythm.     Pulses: Normal pulses.     Heart sounds: Normal heart sounds. No murmur heard. No friction rub. No gallop.   Pulmonary:     Effort: Pulmonary effort is normal.     Breath sounds: Normal breath sounds.     Comments: Lungs clear to auscultation bilaterally.  Symmetric chest rise.  No wheezing, rales, rhonchi. Abdominal:     Palpations: Abdomen is soft. Abdomen is not rigid.     Tenderness: There is abdominal tenderness in the right lower quadrant. There is right CVA tenderness. There is no guarding.     Comments: Abdomen soft, distended.  Tenderness noted diffusely to the right lower quadrant with some voluntary guarding noted.  No rigidity.  Right-sided CVA  tenderness noted.  Musculoskeletal:        General: Normal range of motion.     Cervical back: Full passive range of motion without pain.  Skin:    General: Skin is warm and dry.     Capillary Refill: Capillary refill takes less than 2 seconds.  Neurological:     Mental Status: She is alert and oriented to person, place, and time.  Psychiatric:        Speech: Speech normal.  ED Results / Procedures / Treatments   Labs (all labs ordered are listed, but only abnormal results are displayed) Labs Reviewed  COMPREHENSIVE METABOLIC PANEL - Abnormal; Notable for the following components:      Result Value   Sodium 132 (*)    Glucose, Bld 102 (*)    BUN 27 (*)    Creatinine, Ser 1.55 (*)    Albumin 2.8 (*)    Total Bilirubin 1.3 (*)    GFR, Estimated 37 (*)    All other components within normal limits  CBC WITH DIFFERENTIAL/PLATELET - Abnormal; Notable for the following components:   WBC 40.6 (*)    RBC 3.63 (*)    Hemoglobin 10.6 (*)    HCT 33.5 (*)    Neutro Abs 37.4 (*)    Monocytes Absolute 1.2 (*)    Basophils Absolute 0.4 (*)    All other components within normal limits  URINALYSIS, ROUTINE W REFLEX MICROSCOPIC - Abnormal; Notable for the following components:   Color, Urine AMBER (*)    APPearance CLOUDY (*)    Hgb urine dipstick MODERATE (*)    Ketones, ur 20 (*)    Protein, ur 100 (*)    Leukocytes,Ua LARGE (*)    WBC, UA >50 (*)    Bacteria, UA FEW (*)    All other components within normal limits  RESP PANEL BY RT-PCR (FLU A&B, COVID) ARPGX2  CULTURE, BLOOD (ROUTINE X 2)  CULTURE, BLOOD (ROUTINE X 2)  LIPASE, BLOOD  LACTIC ACID, PLASMA  POC SARS CORONAVIRUS 2 AG -  ED    EKG None  Radiology CT ABDOMEN PELVIS WO CONTRAST  Result Date: 10/24/2020 CLINICAL DATA:  65 year old female with abdominal pain and fever. EXAM: CT ABDOMEN AND PELVIS WITHOUT CONTRAST TECHNIQUE: Multidetector CT imaging of the abdomen and pelvis was performed following the standard  protocol without IV contrast. COMPARISON:  CT abdomen pelvis dated 09/08/2010. FINDINGS: Evaluation of this exam is limited in the absence of intravenous contrast. Lower chest: Minimal bibasilar linear atelectasis/scarring. The visualized lung bases are otherwise clear. No intra-abdominal free air or free fluid. Hepatobiliary: Small calcific focus over the left lobe of the liver. The liver is otherwise unremarkable. No intrahepatic biliary dilatation. The gallbladder is unremarkable. Pancreas: Unremarkable. No pancreatic ductal dilatation or surrounding inflammatory changes. Spleen: Normal in size without focal abnormality. Adrenals/Urinary Tract: The adrenal glands unremarkable. There is a 9 mm stone in the right renal pelvis at the ureteropelvic junction. There is mild fullness of the right renal collecting system with mild right perinephric stranding. The left kidney, left ureter, and urinary bladder appear unremarkable. Stomach/Bowel: There is no bowel obstruction or active inflammation. The appendix is normal. Vascular/Lymphatic: Mild atherosclerotic calcification of the abdominal aorta. The IVC is unremarkable. No portal venous gas. There is no adenopathy. Reproductive: The uterus is anteverted and grossly unremarkable. No adnexal masses Other: None Musculoskeletal: No acute or significant osseous findings. IMPRESSION: 1. A 9 mm right UPJ stone with mild fullness of the right renal collecting system. 2. No bowel obstruction. Normal appendix. 3. Aortic Atherosclerosis (ICD10-I70.0). Electronically Signed   By: Anner Crete M.D.   On: 10/24/2020 20:51    Procedures .Critical Care Performed by: Volanda Napoleon, PA-C Authorized by: Volanda Napoleon, PA-C   Critical care provider statement:    Critical care time (minutes):  35   Critical care was necessary to treat or prevent imminent or life-threatening deterioration of the following conditions: infected kidney stone.  Critical care was time  spent personally by me on the following activities:  Discussions with consultants, evaluation of patient's response to treatment, examination of patient, ordering and performing treatments and interventions, ordering and review of laboratory studies, ordering and review of radiographic studies, pulse oximetry, re-evaluation of patient's condition, obtaining history from patient or surrogate and review of old charts   I assumed direction of critical care for this patient from another provider in my specialty: yes     Care discussed with: admitting provider       Medications Ordered in ED Medications  cefTRIAXone (ROCEPHIN) 2 g in sodium chloride 0.9 % 100 mL IVPB (2 g Intravenous New Bag/Given 10/24/20 2310)  ondansetron (ZOFRAN) injection 4 mg (4 mg Intravenous Given 10/24/20 2254)  HYDROmorphone (DILAUDID) injection 0.5 mg (0.5 mg Intravenous Given 10/24/20 2254)  sodium chloride 0.9 % bolus 1,000 mL (1,000 mLs Intravenous New Bag/Given 10/24/20 2256)    ED Course  I have reviewed the triage vital signs and the nursing notes.  Pertinent labs & imaging results that were available during my care of the patient were reviewed by me and considered in my medical decision making (see chart for details).    MDM Rules/Calculators/A&P                          65 year old female who presents for evaluation of abdominal pain, nausea/vomiting/diarrhea.  Has had vague symptoms over the last 2 weeks but states symptoms worsened last for 5 days.  Has not been able to tolerate p.o.  Reports fevers at home.  Initially arrival, she is afebrile, appears uncomfortable.  Vitals otherwise stable.  On exam, she has tenderness palpation noted diffusely to the right lower quadrant as well as right-sided CVA tenderness.  Labs, imaging ordered at triage.  CBC shows leukocytosis of 40.6.  Hemoglobin is 10.6.  UA shows moderate hemoglobin, ketones, large leukocytes, pyuria,, bacteria.  Lipase normal.  CMP shows BUN of 27,  creatinine of 1.55.  The last BUN and creatinine I see in our system was from about 7 years ago.  At that time, was normal.  Patient started on Rocephin.  Urine culture sent.  CT scan reviewed.  There is a 9 mm right UPJ stone with mild fullness of right renal collecting system.  Will consult urology.  Discussed patient with Dr. Abner Greenspan (urology).  He will plan to stent her tonight given concerns for infection.  He requested medicine admit patient.  Updated patient on plan. She is agreeable. Discussed with her that she will meet the urologist at the procedure.   Discussed patient with Dr. Marlowe Sax (hospitalist) who accepts patients for admission.   Portions of this note were generated with [employer]. Dictation errors may occur despite best attempts at proofreading.  Final Clinical Impression(s) / ED Diagnoses Final diagnoses:  Kidney stone  Acute cystitis without hematuria    Rx / DC Orders ED Discharge Orders    None       Desma Mcgregor 10/24/20 2325    Margette Fast, MD 10/28/20 [phone]

## 2020-10-24 NOTE — Discharge Instructions (Signed)
[facility] [phone] Post Stent Instructions  Definitions:  Ureter: The duct that transports urine from the kidney to the bladder. Stent:   A plastic hollow tube that is placed into the ureter, from the kidney to the bladder to prevent the ureter from swelling shut.  GENERAL INSTRUCTIONS:  Despite the fact that no skin incisions were used, the area around the ureter and bladder is raw and irritated. The stent is a foreign body which will further irritate the bladder wall. This irritation is manifested by increased frequency of urination, both day and night, and by an increase in the urge to urinate. In some, the urge to urinate is present almost always. Sometimes the urge is strong enough that you may not be able to stop yourself from urinating. The only real cure is to remove the stent and then give time for the bladder wall to heal which can't be done until the danger of the ureter swelling shut has passed, which varies.  You may see some blood in your urine while the stent is in place and a few days afterwards. Do not be alarmed, even if the urine was clear for a while. Get off your feet and drink lots of fluids until clearing occurs. If you start to pass clots or don't improve, call us.  DIET: You may return to your normal diet immediately. Because of the raw surface of your bladder, alcohol, spicy foods, acid type foods and drinks with caffeine may cause irritation or frequency and should be used in moderation. To keep your urine flowing freely and to avoid constipation, drink plenty of fluids during the day ( 8-10 glasses ). Tip: Avoid cranberry juice because it is very acidic.  ACTIVITY: Your physical activity doesn't need to be restricted. However, if you are very active, you may see some blood in your urine. We suggest that you reduce your activity under these circumstances until the bleeding has stopped.  BOWELS: It is important to keep your bowels regular during  the postoperative period. Straining with bowel movements can cause bleeding. A bowel movement every other day is reasonable. Use a mild laxative if needed, such as Milk of Magnesia 2-3 tablespoons, or 2 Dulcolax tablets. Call if you continue to have problems. If you have been taking narcotics for pain, before, during or after your surgery, you may be constipated. Take a laxative if necessary.   MEDICATION: You should resume your pre-surgery medications unless told not to. In addition you will often be given an antibiotic to prevent infection. These should be taken as prescribed until the bottles are finished unless you are having an unusual reaction to one of the drugs.  PROBLEMS YOU SHOULD REPORT TO US: Fevers over 100.5 Fahrenheit. Heavy bleeding, or clots ( See above notes about blood in urine ). Inability to urinate. Drug reactions ( hives, rash, nausea, vomiting, diarrhea ). Severe burning or pain with urination that is not improving.  FOLLOW-UP: You will need a follow-up appointment to monitor your progress. Call for this appointment at the number listed above. Usually the first appointment will be about three to fourteen days after your surgery.  

## 2020-10-24 NOTE — ED Provider Notes (Addendum)
Emergency Medicine Provider Triage Evaluation Note  Sandy Sutton , a 65 y.o. female  was evaluated in triage.  Pt complains of abd pain, nvd for 2 weeks. She also reports fevers at home.  Review of Systems  Positive: abd pain, nvd Negative: Chest pain, urinary sxs  Physical Exam  BP (!) 105/52 (BP Location: Left Arm)   Pulse (!) 112   Temp 99.5 F (37.5 C) (Oral)   Resp 18   SpO2 96%  Gen:   Awake, no distress   Resp:  Normal effort  MSK:   Moves extremities without difficulty  Other:  ttp to the luq, epigastrium, ruq and rlq with guarding  Medical Decision Making  Medically screening exam initiated at 4:13 PM.  Appropriate orders placed.  Helga Asbury was informed that the remainder of the evaluation will be completed by another provider, this initial triage assessment does not replace that evaluation, and the importance of remaining in the ED until their evaluation is complete.  9:03 PM charge nurse made aware that pt needs to be prioritized for a room as she has an infected kidney stone.   Rodney Booze, PA-C 10/24/20 1613    Rodney Booze, PA-C 10/24/20 2103    Varney Biles, MD 10/29/20 [phone]

## 2020-10-24 NOTE — H&P (Signed)
Urology Consult   Reason for consult: Right UPJ stone with sign of infection  History of Present Illness: Sandy Sutton is a 65 y.o. presented the ED today with complaints of abdominal pain, nausea, vomiting for the past 2 weeks.  She has had for the last 3 weeks, she has noticed right-sided abdominal pain, nausea and emesis.  She states she was febrile at home with a temperature greater than 102.  CT A/P 10/24/2020 revealed a 9 mm right UPJ stone with sign of obstruction.  There is mild fullness of the collecting system.  She was febrile at home at 102.  She does have leukocytosis of 40.6.   Past Medical History:  Diagnosis Date  . Arthritis    hands and feet  . GERD (gastroesophageal reflux disease)    doesn't take any meds  . Gout    hx of  . History of bronchitis 2015  . History of colon polyps benign  . History of gastric ulcer   . History of migraine   . Joint pain   . Joint swelling    bil knee and wrist  . Pneumonia    hx of-at least 28yr ago  . Urinary frequency   . Urinary urgency   . Weakness    right hand and pt states from pinched nerve    Past Surgical History:  Procedure Laterality Date  . Colonosscopy    . ESOPHAGOGASTRODUODENOSCOPY    . KNEE ARTHROPLASTY Left 03/13/2015   Procedure: COMPUTER ASSISTED TOTAL KNEE ARTHROPLASTY; LEFT KNEE;  Surgeon: MMarybelle Killings MD;  [place] [facility]  Service: Orthopedics;  Laterality: Left;  . KNEE ARTHROSCOPY Left 2015  . THYROID LOBECTOMY Right 08/19/2012   Procedure: THYROID LOBECTOMY;  Surgeon: TEarnstine Regal MD;  Location: [facility];  Service: General;  Laterality: Right;  . TUBAL LIGATION  1995     Current Hospital Medications:  Home meds:  No current facility-administered medications on file prior to encounter.   Current Outpatient Medications on File Prior to Encounter  Medication Sig Dispense Refill  . amoxicillin (AMOXIL) 500 MG capsule Take 500 mg by mouth 3 (three) times daily.    .Marland Kitchenaspirin EC 325 MG EC  tablet Take 1 tablet (325 mg total) by mouth daily. 30 tablet 0  . fexofenadine-pseudoephedrine (ALLEGRA-D 24) 180-240 MG 24 hr tablet Take 1 tablet by mouth daily.    .Marland KitchenHYDROcodone-acetaminophen (NORCO/VICODIN) 5-325 MG tablet Take 1 tablet by mouth every 6 (six) hours as needed for moderate pain.    .Marland Kitchenibuprofen (ADVIL) 400 MG tablet Take 400 mg by mouth every 4 (four) hours as needed.    . methocarbamol (ROBAXIN) 500 MG tablet Take 1 tablet (500 mg total) by mouth every 8 (eight) hours as needed for muscle spasms. DO NOT DRINK ALCOHOL OR DRIVE WHILE TAKING THIS MEDICINE 30 tablet 0  . oxyCODONE-acetaminophen (PERCOCET) 7.5-325 MG tablet Take 1 tablet by mouth every 4 (four) hours as needed for severe pain. 60 tablet 0  . predniSONE (DELTASONE) 10 MG tablet Take 6 tabs day one, 5 tabs day two, 4 tabs day three, etc 21 tablet 0     Scheduled Meds: Continuous Infusions: . cefTRIAXone (ROCEPHIN)  IV 2 g (10/24/20 2310)   PRN Meds:.  Allergies:  Allergies  Allergen Reactions  . Darvon Other (See Comments)    Hallucinations  . Propoxyphene Other (See Comments)    Hallucinations  . Erythromycin Nausea And Vomiting and Rash  . Hydrocodone Nausea And Vomiting  Family History  Problem Relation Age of Onset  . Alzheimer's disease Father   . Cancer Mother   . Hypertension Sister   . Breast cancer Sister   . Lupus Sister   . Cancer Brother     Social History:  reports that she has never smoked. She has never used smokeless tobacco. She reports current alcohol use. She reports that she does not use drugs.  ROS: A complete review of systems was performed.  All systems are negative except for pertinent findings as noted.  Physical Exam:  Vital signs in last 24 hours: Temp:  [99.5 F (37.5 C)-100 F (37.8 C)] 100 F (37.8 C) (05/12 1933) Pulse Rate:  [102-112] 103 (05/12 2300) Resp:  [16-25] 17 (05/12 2300) BP: (105-124)/(52-63) 121/57 (05/12 2300) SpO2:  [96 %-99 %] 97 % (05/12  2300) Weight:  [73.9 kg] 73.9 kg (05/12 2217) Constitutional:  Alert and oriented, No acute distress Cardiovascular: Regular rate and rhythm Respiratory: Normal respiratory effort, Lungs clear bilaterally GI: Abdomen is soft, nontender, nondistended, no abdominal masses GU: No CVA tenderness Neurologic: Grossly intact, no focal deficits Psychiatric: Normal mood and affect  Laboratory Data:  Recent Labs    10/24/20 1543  WBC 40.6*  HGB 10.6*  HCT 33.5*  PLT 363    Recent Labs    10/24/20 1543  NA 132*  K 4.0  CL 98  GLUCOSE 102*  BUN 27*  CALCIUM 8.9  CREATININE 1.55*     Results for orders placed or performed during the hospital encounter of 10/24/20 (from the past 24 hour(s))  Comprehensive metabolic panel     Status: Abnormal   Collection Time: 10/24/20  3:43 PM  Result Value Ref Range   Sodium 132 (L) 135 - 145 mmol/L   Potassium 4.0 3.5 - 5.1 mmol/L   Chloride 98 98 - 111 mmol/L   CO2 25 22 - 32 mmol/L   Glucose, Bld 102 (H) 70 - 99 mg/dL   BUN 27 (H) 8 - 23 mg/dL   Creatinine, Ser 1.55 (H) 0.44 - 1.00 mg/dL   Calcium 8.9 8.9 - 10.3 mg/dL   Total Protein 7.9 6.5 - 8.1 g/dL   Albumin 2.8 (L) 3.5 - 5.0 g/dL   AST 29 15 - 41 U/L   ALT 28 0 - 44 U/L   Alkaline Phosphatase 116 38 - 126 U/L   Total Bilirubin 1.3 (H) 0.3 - 1.2 mg/dL   GFR, Estimated 37 (L) >60 mL/min   Anion gap 9 5 - 15  Lipase, blood     Status: None   Collection Time: 10/24/20  3:43 PM  Result Value Ref Range   Lipase 24 11 - 51 U/L  CBC with Differential     Status: Abnormal   Collection Time: 10/24/20  3:43 PM  Result Value Ref Range   WBC 40.6 (H) 4.0 - 10.5 K/uL   RBC 3.63 (L) 3.87 - 5.11 MIL/uL   Hemoglobin 10.6 (L) 12.0 - 15.0 g/dL   HCT 33.5 (L) 36.0 - 46.0 %   MCV 92.3 80.0 - 100.0 fL   MCH 29.2 26.0 - 34.0 pg   MCHC 31.6 30.0 - 36.0 g/dL   RDW 13.3 11.5 - 15.5 %   Platelets 363 150 - 400 K/uL   nRBC 0.0 0.0 - 0.2 %   Neutrophils Relative % 92 %   Neutro Abs 37.4 (H) 1.7  - 7.7 K/uL   Lymphocytes Relative 4 %   Lymphs Abs 1.6 0.7 -  4.0 K/uL   Monocytes Relative 3 %   Monocytes Absolute 1.2 (H) 0.1 - 1.0 K/uL   Eosinophils Relative 0 %   Eosinophils Absolute 0.0 0.0 - 0.5 K/uL   Basophils Relative 1 %   Basophils Absolute 0.4 (H) 0.0 - 0.1 K/uL   nRBC 0 0 /100 WBC   Abs Immature Granulocytes 0.00 0.00 - 0.07 K/uL  Urinalysis, Routine w reflex microscopic Urine, Clean Catch     Status: Abnormal   Collection Time: 10/24/20  5:28 PM  Result Value Ref Range   Color, Urine AMBER (A) YELLOW   APPearance CLOUDY (A) CLEAR   Specific Gravity, Urine 1.020 1.005 - 1.030   pH 5.0 5.0 - 8.0   Glucose, UA NEGATIVE NEGATIVE mg/dL   Hgb urine dipstick MODERATE (A) NEGATIVE   Bilirubin Urine NEGATIVE NEGATIVE   Ketones, ur 20 (A) NEGATIVE mg/dL   Protein, ur 100 (A) NEGATIVE mg/dL   Nitrite NEGATIVE NEGATIVE   Leukocytes,Ua LARGE (A) NEGATIVE   RBC / HPF 6-10 0 - 5 RBC/hpf   WBC, UA >50 (H) 0 - 5 WBC/hpf   Bacteria, UA FEW (A) NONE SEEN   Squamous Epithelial / LPF 0-5 0 - 5   WBC Clumps PRESENT    Mucus PRESENT   POC SARS Coronavirus 2 Ag-ED - Nasal Swab (BD Veritor Kit)     Status: None   Collection Time: 10/24/20 11:08 PM  Result Value Ref Range   SARSCOV2ONAVIRUS 2 AG NEGATIVE NEGATIVE   No results found for this or any previous visit (from the past 240 hour(s)).  Renal Function: Recent Labs    10/24/20 1543  CREATININE 1.55*   Estimated Creatinine Clearance: 34 mL/min (A) (by C-G formula based on SCr of 1.55 mg/dL (H)).  Radiologic Imaging: CT ABDOMEN PELVIS WO CONTRAST  Result Date: 10/24/2020 CLINICAL DATA:  65 year old female with abdominal pain and fever. EXAM: CT ABDOMEN AND PELVIS WITHOUT CONTRAST TECHNIQUE: Multidetector CT imaging of the abdomen and pelvis was performed following the standard protocol without IV contrast. COMPARISON:  CT abdomen pelvis dated 09/08/2010. FINDINGS: Evaluation of this exam is limited in the absence of  intravenous contrast. Lower chest: Minimal bibasilar linear atelectasis/scarring. The visualized lung bases are otherwise clear. No intra-abdominal free air or free fluid. Hepatobiliary: Small calcific focus over the left lobe of the liver. The liver is otherwise unremarkable. No intrahepatic biliary dilatation. The gallbladder is unremarkable. Pancreas: Unremarkable. No pancreatic ductal dilatation or surrounding inflammatory changes. Spleen: Normal in size without focal abnormality. Adrenals/Urinary Tract: The adrenal glands unremarkable. There is a 9 mm stone in the right renal pelvis at the ureteropelvic junction. There is mild fullness of the right renal collecting system with mild right perinephric stranding. The left kidney, left ureter, and urinary bladder appear unremarkable. Stomach/Bowel: There is no bowel obstruction or active inflammation. The appendix is normal. Vascular/Lymphatic: Mild atherosclerotic calcification of the abdominal aorta. The IVC is unremarkable. No portal venous gas. There is no adenopathy. Reproductive: The uterus is anteverted and grossly unremarkable. No adnexal masses Other: None Musculoskeletal: No acute or significant osseous findings. IMPRESSION: 1. A 9 mm right UPJ stone with mild fullness of the right renal collecting system. 2. No bowel obstruction. Normal appendix. 3. Aortic Atherosclerosis (ICD10-I70.0). Electronically Signed   By: Anner Crete M.D.   On: 10/24/2020 20:51    I independently reviewed the above imaging studies.  Impression/Recommendation: 1. Right UPJ stone: Measures 69m on CT A/P 10/24/20 2. Sepsis due to obstructing  UPJ stone: Febrile at home, leukocytosis at 40.6, UA with neg nitrite, large LE, Creatinine 1.55 from baseline 1 3. AKI: due to obstructed renal stone as above  -The risks, benefits and alternatives of cystoscopy with right JJ stent placement was discussed with the patient.  Risks include, but are not limited to: bleeding, urinary  tract infection, ureteral injury, ureteral stricture disease, chronic pain, urinary symptoms, bladder injury, stent migration, the need for nephrostomy tube placement, MI, CVA, DVT, PE and the inherent risks with general anesthesia.  The patient voices understanding and wishes to proceed.  -Continue broad spectrum abx -F/u urine culture. Will need to be discharged home on culture specific abx -Messaged schedulers to arrange for followup for likely URS/LL  Matt R. Yang Rack MD 10/24/2020, 11:19 PM  [facility]  Pager: [phone]

## 2020-10-25 ENCOUNTER — Inpatient Hospital Stay (HOSPITAL_COMMUNITY): Payer: Medicare Other

## 2020-10-25 ENCOUNTER — Other Ambulatory Visit: Payer: Self-pay

## 2020-10-25 ENCOUNTER — Other Ambulatory Visit: Payer: Medicare Other

## 2020-10-25 ENCOUNTER — Encounter (HOSPITAL_COMMUNITY): Payer: Self-pay | Admitting: Urology

## 2020-10-25 DIAGNOSIS — A419 Sepsis, unspecified organism: Secondary | ICD-10-CM | POA: Diagnosis present

## 2020-10-25 DIAGNOSIS — N390 Urinary tract infection, site not specified: Secondary | ICD-10-CM

## 2020-10-25 DIAGNOSIS — N179 Acute kidney failure, unspecified: Secondary | ICD-10-CM | POA: Diagnosis present

## 2020-10-25 LAB — BASIC METABOLIC PANEL
Anion gap: 10 (ref 5–15)
BUN: 25 mg/dL — ABNORMAL HIGH (ref 8–23)
CO2: 23 mmol/L (ref 22–32)
Calcium: 9 mg/dL (ref 8.9–10.3)
Chloride: 102 mmol/L (ref 98–111)
Creatinine, Ser: 1.31 mg/dL — ABNORMAL HIGH (ref 0.44–1.00)
GFR, Estimated: 45 mL/min — ABNORMAL LOW (ref >60)
Glucose, Bld: 143 mg/dL — ABNORMAL HIGH (ref 70–99)
Potassium: 4.4 mmol/L (ref 3.5–5.1)
Sodium: 135 mmol/L (ref 135–145)

## 2020-10-25 LAB — CBC
HCT: 29 % — ABNORMAL LOW (ref 36.0–46.0)
Hemoglobin: 9.4 g/dL — ABNORMAL LOW (ref 12.0–15.0)
MCH: 29.2 pg (ref 26.0–34.0)
MCHC: 32.4 g/dL (ref 30.0–36.0)
MCV: 90.1 fL (ref 80.0–100.0)
Platelets: 320 10*3/uL (ref 150–400)
RBC: 3.22 MIL/uL — ABNORMAL LOW (ref 3.87–5.11)
RDW: 13.3 % (ref 11.5–15.5)
WBC: 28.7 10*3/uL — ABNORMAL HIGH (ref 4.0–10.5)
nRBC: 0 % (ref 0.0–0.2)

## 2020-10-25 LAB — HIV ANTIBODY (ROUTINE TESTING W REFLEX): HIV Screen 4th Generation wRfx: NONREACTIVE

## 2020-10-25 MED ORDER — SUCCINYLCHOLINE CHLORIDE 200 MG/10ML IV SOSY
PREFILLED_SYRINGE | INTRAVENOUS | Status: DC | PRN
  Administered 2020-10-25: 100 mg via INTRAVENOUS

## 2020-10-25 MED ORDER — SODIUM CHLORIDE 0.9 % IV SOLN: 2.0000 g | INTRAVENOUS | Status: DC

## 2020-10-25 MED ORDER — PHENYLEPHRINE HCL-NACL 10-0.9 MG/250ML-% IV SOLN
INTRAVENOUS | Status: DC | PRN
  Administered 2020-10-25: 25 ug/min via INTRAVENOUS

## 2020-10-25 MED ORDER — LIDOCAINE 2% (20 MG/ML) 5 ML SYRINGE
INTRAMUSCULAR | Status: DC | PRN
  Administered 2020-10-25: 40 mg via INTRAVENOUS

## 2020-10-25 MED ORDER — PHENYLEPHRINE 40 MCG/ML (10ML) SYRINGE FOR IV PUSH (FOR BLOOD PRESSURE SUPPORT)
PREFILLED_SYRINGE | INTRAVENOUS | Status: DC | PRN
  Administered 2020-10-25 (×3): 80 ug via INTRAVENOUS

## 2020-10-25 MED ORDER — VANCOMYCIN HCL 1500 MG/300ML IV SOLN
1500.0000 mg | Freq: Once | INTRAVENOUS | Status: AC
  Administered 2020-10-25: 1500 mg via INTRAVENOUS
  Filled 2020-10-25: qty 300

## 2020-10-25 MED ORDER — VANCOMYCIN HCL 1000 MG/200ML IV SOLN: 1000.0000 mg | INTRAVENOUS | Status: DC

## 2020-10-25 MED ORDER — PROPOFOL 10 MG/ML IV BOLUS
INTRAVENOUS | Status: DC | PRN
  Administered 2020-10-25: 120 mg via INTRAVENOUS

## 2020-10-25 MED ORDER — ACETAMINOPHEN 650 MG RE SUPP: 650.0000 mg | Freq: Four times a day (QID) | RECTAL | Status: DC | PRN

## 2020-10-25 MED ORDER — DEXAMETHASONE SODIUM PHOSPHATE 10 MG/ML IJ SOLN
INTRAMUSCULAR | Status: DC | PRN
  Administered 2020-10-25: 5 mg via INTRAVENOUS

## 2020-10-25 MED ORDER — ALBUMIN HUMAN 5 % IV SOLN: INTRAVENOUS | Status: DC | PRN

## 2020-10-25 MED ORDER — ONDANSETRON HCL 4 MG/2ML IJ SOLN
INTRAMUSCULAR | Status: DC | PRN
  Administered 2020-10-25: 4 mg via INTRAVENOUS

## 2020-10-25 MED ORDER — EPHEDRINE SULFATE-NACL 50-0.9 MG/10ML-% IV SOSY
PREFILLED_SYRINGE | INTRAVENOUS | Status: DC | PRN
  Administered 2020-10-25 (×2): 5 mg via INTRAVENOUS

## 2020-10-25 MED ORDER — ONDANSETRON HCL 4 MG/2ML IJ SOLN: 4.0000 mg | Freq: Once | INTRAMUSCULAR | Status: DC | PRN

## 2020-10-25 MED ORDER — SODIUM CHLORIDE 0.9 % IV SOLN
2.0000 g | Freq: Two times a day (BID) | INTRAVENOUS | Status: DC
  Administered 2020-10-25 – 2020-10-26 (×4): 2 g via INTRAVENOUS
  Filled 2020-10-25 (×6): qty 2

## 2020-10-25 MED ORDER — SODIUM CHLORIDE 0.9 % IV SOLN
1.0000 g | INTRAVENOUS | Status: DC
  Filled 2020-10-25: qty 10

## 2020-10-25 MED ORDER — ACETAMINOPHEN 325 MG PO TABS
650.0000 mg | ORAL_TABLET | Freq: Four times a day (QID) | ORAL | Status: DC | PRN
  Administered 2020-10-25 – 2020-10-27 (×5): 650 mg via ORAL
  Filled 2020-10-25 (×5): qty 2

## 2020-10-25 MED ORDER — FENTANYL CITRATE (PF) 100 MCG/2ML IJ SOLN: 25.0000 ug | INTRAMUSCULAR | Status: DC | PRN

## 2020-10-25 MED ORDER — LACTATED RINGERS IV SOLN: INTRAVENOUS | Status: DC | PRN

## 2020-10-25 MED ORDER — PANTOPRAZOLE SODIUM 40 MG PO TBEC
40.0000 mg | DELAYED_RELEASE_TABLET | Freq: Every day | ORAL | Status: DC
  Administered 2020-10-25 – 2020-10-29 (×5): 40 mg via ORAL
  Filled 2020-10-25 (×5): qty 1

## 2020-10-25 MED ORDER — FENTANYL CITRATE (PF) 250 MCG/5ML IJ SOLN
INTRAMUSCULAR | Status: DC | PRN
  Administered 2020-10-24: 50 ug via INTRAVENOUS

## 2020-10-25 MED ORDER — SODIUM CHLORIDE 0.9 % IV SOLN: INTRAVENOUS | Status: AC

## 2020-10-25 NOTE — Transfer of Care (Signed)
Immediate Anesthesia Transfer of Care Note  Patient: Sandy Sutton  Procedure(s) Performed: CYSTOSCOPY WITH RETROGRADE PYELOGRAM/URETERAL STENT PLACEMENT (Right Bladder)  Patient Location: PACU  Anesthesia Type:General  Level of Consciousness: awake, alert  and oriented  Airway & Oxygen Therapy: Patient Spontanous Breathing and Patient connected to nasal cannula oxygen  Post-op Assessment: Report given to RN and Post -op Vital signs reviewed and stable  Post vital signs: Reviewed and stable  Last Vitals:  Vitals Value Taken Time  BP 112/62 10/25/20 0044  Temp    Pulse 110 10/25/20 0045  Resp 23 10/25/20 0045  SpO2 100 % 10/25/20 0045  Vitals shown include unvalidated device data.  Last Pain:  Vitals:   10/24/20 1933  TempSrc: Oral  PainSc:          Complications: No complications documented.

## 2020-10-25 NOTE — Progress Notes (Signed)
PROGRESS NOTE    Sandy Sutton  HBZ:[id number] DOB: 11/30/1955 DOA: 10/24/2020 PCP: Lucianne Lei, MD   Brief Narrative:  HPI: Sandy Sutton is a 65 y.o. female with medical history significant of arthritis, GERD, gout presented to the ED with complaints of abdominal pain, nausea, vomiting, and diarrhea.  In the ED, tachycardic but not hypotensive.  Labs showing WBC 40.6, hemoglobin 10.6 (stable), platelet count 363K.  Sodium 132, potassium 4.0, chloride 98, bicarb 25, BUN 27, creatinine 1.5, glucose 102.  No significant elevation of LFTs.  Lipase normal.  UA with large amount of leukocytes and greater than 50 WBCs.  Urine culture pending.  Screening COVID and influenza PCR negative.  Blood culture x2 pending.  Lactic acid 1.0.  CT abdomen pelvis showing a 9 mm right UPJ stone with mild fullness of the right renal collecting system.  Patient was given ceftriaxone and 1 L fluid bolus.  Urology was consulted and patient underwent right ureteral stent placement.  Patient reports 2-week history of right-sided flank pain radiating to her abdomen associated with nausea, vomiting, and chills.  Denies history of kidney stones.  She is fully vaccinated against COVID.  States she now feels much better after the procedure.  Her pain has improved significantly and she is no longer nauseous.  No other complaints.  Assessment & Plan:   Principal Problem:   Complicated UTI (urinary tract infection) Active Problems:   Sepsis ([place])   AKI (acute kidney injury) ([place])   Sepsis secondary to complicated UTI/infected nephrolithiasis, POA: Met criteria for sepsis with tachycardia and significant leukocytosis.  Urinalysis with evidence of pyuria.  CT showing a 9 mm right UPJ stone with mild fullness of the right renal collecting system.  She underwent right ureteral stent placement by urology.  She feels much better.  Leukocytosis improving.  She is afebrile.  Parameters are improving as well.  Cultures negative  thus far.  Continue cefepime but doubt any MRSA infection so we will discontinue vancomycin especially now that she already has AKI.  For some reason, patient only received 1 L of IV fluid for sepsis.  No fluids continued.  We will start this patient on normal saline at 150 cc/h for 10 hours.  AKI/obstructive uropathy: Creatinine improved somewhat.  IV fluids as mentioned above.  Labs repeat in the morning.  GERD: Resume PPI.  DVT prophylaxis: SCDs Start: 10/25/20 0537   Code Status: Full Code  Family Communication: None present at bedside.  Plan of care discussed with patient in length and he verbalized understanding and agreed with it.  Status is: Inpatient  Remains inpatient appropriate because:Inpatient level of care appropriate due to severity of illness   Dispo: The patient is from: Home              Anticipated d/c is to: Home              Patient currently is not medically stable to d/c.   Difficult to place patient No        Estimated body mass index is 30.08 kg/m as calculated from the following:   Height as of this encounter: 5' 2.5" (1.588 m).   Weight as of this encounter: 75.8 kg.      Nutritional status:               Consultants:   Urology  Procedures:   As above  Antimicrobials:  Anti-infectives (From admission, onward)   Start     Dose/Rate Route Frequency Ordered  Stop   10/27/20 0600  vancomycin (VANCOREADY) IVPB 1000 mg/200 mL        1,000 mg 200 mL/hr over 60 Minutes Intravenous Every 48 hours 10/25/20 0547     10/25/20 2200  cefTRIAXone (ROCEPHIN) 1 g in sodium chloride 0.9 % 100 mL IVPB  Status:  Discontinued        1 g 200 mL/hr over 30 Minutes Intravenous Every 24 hours 10/25/20 0038 10/25/20 0537   10/25/20 0800  ceFEPIme (MAXIPIME) 2 g in sodium chloride 0.9 % 100 mL IVPB        2 g 200 mL/hr over 30 Minutes Intravenous Every 12 hours 10/25/20 0547     10/25/20 0645  vancomycin (VANCOREADY) IVPB 1500 mg/300 mL        1,500  mg 150 mL/hr over 120 Minutes Intravenous  Once 10/25/20 0547 10/25/20 0842   10/25/20 0045  cefTRIAXone (ROCEPHIN) 2 g in sodium chloride 0.9 % 100 mL IVPB  Status:  Discontinued        2 g 200 mL/hr over 30 Minutes Intravenous Every 24 hours 10/25/20 0037 10/25/20 0038   10/24/20 2215  cefTRIAXone (ROCEPHIN) 2 g in sodium chloride 0.9 % 100 mL IVPB        2 g 200 mL/hr over 30 Minutes Intravenous  Once 10/24/20 2205 10/24/20 2340         Subjective: Seen and examined.  Feeling much better.  Very minimal suprapubic pain.  No other complaint.  Objective: Vitals:   10/25/20 0115 10/25/20 0130 10/25/20 0157 10/25/20 0504  BP: 113/63 111/60 111/64 (!) 105/55  Pulse: (!) 101 99 99 79  Resp: _0 Temp:  98.1 F (36.7 C) 98.3 F (36.8 C) 97.9 F (36.6 C)  TempSrc:   Oral Oral  SpO2: 95% 95% 100% 97%  Weight:   75.8 kg   Height:   5' 2.5" (1.588 m)     Intake/Output Summary (Last 24 hours) at 10/25/2020 1032 Last data filed at 10/25/2020 0600 Gross per 24 hour  Intake 1250 ml  Output 450 ml  Net 800 ml   Filed Weights   10/24/20 2217 10/25/20 0157  Weight: 73.9 kg 75.8 kg    Examination:  General exam: Appears calm and comfortable  Respiratory system: Clear to auscultation. Respiratory effort normal. Cardiovascular system: S1 & S2 heard, RRR. No JVD, murmurs, rubs, gallops or clicks. No pedal edema. Gastrointestinal system: Abdomen is nondistended, soft and mild right lower quadrant tenderness and moderate right CVA tenderness. No organomegaly or masses felt. Normal bowel sounds heard. Central nervous system: Alert and oriented. No focal neurological deficits. Extremities: Symmetric 5 x 5 power. Skin: No rashes, lesions or ulcers Psychiatry: Judgement and insight appear normal. Mood & affect appropriate.    Data Reviewed: I have personally reviewed following labs and imaging studies  CBC: Recent Labs  Lab 10/24/20 1543 10/25/20 0549  WBC 40.6* 28.7*   NEUTROABS 37.4*  --   HGB 10.6* 9.4*  HCT 33.5* 29.0*  MCV 92.3 90.1  PLT 363 269   Basic Metabolic Panel: Recent Labs  Lab 10/24/20 1543 10/25/20 0549  NA 132* 135  K 4.0 4.4  CL 98 102  CO2 25 23  GLUCOSE 102* 143*  BUN 27* 25*  CREATININE 1.55* 1.31*  CALCIUM 8.9 9.0   GFR: Estimated Creatinine Clearance: 40.7 mL/min (A) (by C-G formula based on SCr of 1.31 mg/dL (H)). Liver Function Tests: Recent Labs  Lab 10/24/20 1543  AST 29  ALT 28  ALKPHOS 116  BILITOT 1.3*  PROT 7.9  ALBUMIN 2.8*   Recent Labs  Lab 10/24/20 1543  LIPASE 24   No results for input(s): AMMONIA in the last 168 hours. Coagulation Profile: No results for input(s): INR, PROTIME in the last 168 hours. Cardiac Enzymes: No results for input(s): CKTOTAL, CKMB, CKMBINDEX, TROPONINI in the last 168 hours. BNP (last 3 results) No results for input(s): PROBNP in the last 8760 hours. HbA1C: No results for input(s): HGBA1C in the last 72 hours. CBG: No results for input(s): GLUCAP in the last 168 hours. Lipid Profile: No results for input(s): CHOL, HDL, LDLCALC, TRIG, CHOLHDL, LDLDIRECT in the last 72 hours. Thyroid Function Tests: No results for input(s): TSH, T4TOTAL, FREET4, T3FREE, THYROIDAB in the last 72 hours. Anemia Panel: No results for input(s): VITAMINB12, FOLATE, FERRITIN, TIBC, IRON, RETICCTPCT in the last 72 hours. Sepsis Labs: Recent Labs  Lab 10/24/20 2320  LATICACIDVEN 1.0    Recent Results (from the past 240 hour(s))  Resp Panel by RT-PCR (Flu A&B, Covid) Nasopharyngeal Swab     Status: None   Collection Time: 10/24/20 10:20 PM   Specimen: Nasopharyngeal Swab; Nasopharyngeal(NP) swabs in vial transport medium  Result Value Ref Range Status   SARS Coronavirus 2 by RT PCR NEGATIVE NEGATIVE Final    Comment: (NOTE) SARS-CoV-2 target nucleic acids are NOT DETECTED.  The SARS-CoV-2 RNA is generally detectable in upper respiratory specimens during the acute phase of  infection. The lowest concentration of SARS-CoV-2 viral copies this assay can detect is 138 copies/mL. A negative result does not preclude SARS-Cov-2 infection and should not be used as the sole basis for treatment or other patient management decisions. A negative result may occur with  improper specimen collection/handling, submission of specimen other than nasopharyngeal swab, presence of viral mutation(s) within the areas targeted by this assay, and inadequate number of viral copies(<138 copies/mL). A negative result must be combined with clinical observations, patient history, and epidemiological information. The expected result is Negative.  Fact Sheet for Patients:  EntrepreneurPulse.com.au  Fact Sheet for Healthcare Providers:  IncredibleEmployment.be  This test is no t yet approved or cleared by the [place] FDA and  has been authorized for detection and/or diagnosis of SARS-CoV-2 by FDA under an Emergency Use Authorization (EUA). This EUA will remain  in effect (meaning this test can be used) for the duration of the COVID-19 declaration under Section 564(b)(1) of the Act, 21 U.S.C.section 360bbb-3(b)(1), unless the authorization is terminated  or revoked sooner.       Influenza A by PCR NEGATIVE NEGATIVE Final   Influenza B by PCR NEGATIVE NEGATIVE Final    Comment: (NOTE) The Xpert Xpress SARS-CoV-2/FLU/RSV plus assay is intended as an aid in the diagnosis of influenza from Nasopharyngeal swab specimens and should not be used as a sole basis for treatment. Nasal washings and aspirates are unacceptable for Xpert Xpress SARS-CoV-2/FLU/RSV testing.  Fact Sheet for Patients: EntrepreneurPulse.com.au  Fact Sheet for Healthcare Providers: IncredibleEmployment.be  This test is not yet approved or cleared by the [place] FDA and has been authorized for detection and/or diagnosis of SARS-CoV-2  by FDA under an Emergency Use Authorization (EUA). This EUA will remain in effect (meaning this test can be used) for the duration of the COVID-19 declaration under Section 564(b)(1) of the Act, 21 U.S.C. section 360bbb-3(b)(1), unless the authorization is terminated or revoked.  Performed at [facility], [place] [address], [place], [place]  [address]       Radiology Studies: CT ABDOMEN PELVIS WO CONTRAST  Result Date: 10/24/2020 CLINICAL DATA:  65 year old female with abdominal pain and fever. EXAM: CT ABDOMEN AND PELVIS WITHOUT CONTRAST TECHNIQUE: Multidetector CT imaging of the abdomen and pelvis was performed following the standard protocol without IV contrast. COMPARISON:  CT abdomen pelvis dated 09/08/2010. FINDINGS: Evaluation of this exam is limited in the absence of intravenous contrast. Lower chest: Minimal bibasilar linear atelectasis/scarring. The visualized lung bases are otherwise clear. No intra-abdominal free air or free fluid. Hepatobiliary: Small calcific focus over the left lobe of the liver. The liver is otherwise unremarkable. No intrahepatic biliary dilatation. The gallbladder is unremarkable. Pancreas: Unremarkable. No pancreatic ductal dilatation or surrounding inflammatory changes. Spleen: Normal in size without focal abnormality. Adrenals/Urinary Tract: The adrenal glands unremarkable. There is a 9 mm stone in the right renal pelvis at the ureteropelvic junction. There is mild fullness of the right renal collecting system with mild right perinephric stranding. The left kidney, left ureter, and urinary bladder appear unremarkable. Stomach/Bowel: There is no bowel obstruction or active inflammation. The appendix is normal. Vascular/Lymphatic: Mild atherosclerotic calcification of the abdominal aorta. The IVC is unremarkable. No portal venous gas. There is no adenopathy. Reproductive: The uterus is anteverted and grossly unremarkable. No adnexal masses Other: None  Musculoskeletal: No acute or significant osseous findings. IMPRESSION: 1. A 9 mm right UPJ stone with mild fullness of the right renal collecting system. 2. No bowel obstruction. Normal appendix. 3. Aortic Atherosclerosis (ICD10-I70.0). Electronically Signed   By: Anner Crete M.D.   On: 10/24/2020 20:51   DG Retrograde Pyelogram  Result Date: 10/25/2020 CLINICAL DATA:  65 year old female with retrograde pyelogram and right sided stent. EXAM: RETROGRADE PYELOGRAM COMPARISON:  None. FINDINGS: Four intraoperative fluoroscopic images provided. The total fluoroscopic time is 26 seconds. A right-sided ureteral stent is noted. Contrast injected through the stent opacifies the renal collecting system. IMPRESSION: Right ureteral stent placement. Electronically Signed   By: Anner Crete M.D.   On: 10/25/2020 01:11    Scheduled Meds: Continuous Infusions: . sodium chloride 150 mL/hr at 10/25/20 0854  . ceFEPime (MAXIPIME) IV 2 g (10/25/20 0857)  . [START ON 10/27/2020] vancomycin       LOS: 1 day   Time spent: 36 minutes   Darliss Cheney, MD [facility]  10/25/2020, 10:32 AM   How to contact the [facility] Attending or Consulting provider [place] or covering provider during after hours [place], for this patient?  1. Check the care team in [facility] and look for a) attending/consulting [facility] provider listed and b) the [facility] team listed. Page or secure chat [phone]. 2. Log into [email or web address] and use [place]'s universal password to access. If you do not have the password, please contact the hospital operator. 3. Locate the [facility] provider you are looking for under [facility] and page to a number that you can be directly reached. 4. If you still have difficulty reaching the provider, please page the [facility] (Director on Call) for the Hospitalists listed on amion for assistance.

## 2020-10-25 NOTE — Anesthesia Procedure Notes (Signed)
Procedure Name: Intubation Date/Time: 10/25/2020 12:02 AM Performed by: Jearld Pies, CRNA Pre-anesthesia Checklist: Patient identified, Emergency Drugs available, Suction available and Patient being monitored Patient Re-evaluated:Patient Re-evaluated prior to induction Oxygen Delivery Method: Circle System Utilized Preoxygenation: Pre-oxygenation with 100% oxygen Induction Type: IV induction, Rapid sequence and Cricoid Pressure applied Laryngoscope Size: Miller and 2 Grade View: Grade I Tube type: Oral Tube size: 7.0 mm Number of attempts: 1 Airway Equipment and Method: Stylet and Oral airway Placement Confirmation: ETT inserted through vocal cords under direct vision,  positive ETCO2 and breath sounds checked- equal and bilateral Secured at: 21 cm Tube secured with: Tape Dental Injury: Teeth and Oropharynx as per pre-operative assessment

## 2020-10-25 NOTE — Anesthesia Preprocedure Evaluation (Addendum)
Anesthesia Evaluation  Patient identified by MRN, date of birth, ID band Patient awake    Reviewed: Allergy & Precautions, NPO status , Patient's Chart, lab work & pertinent test results  Airway Mallampati: II  TM Distance: >3 FB Neck ROM: Full    Dental  (+) Teeth Intact, Dental Advisory Given   Pulmonary    breath sounds clear to auscultation       Cardiovascular  Rhythm:Regular Rate:Tachycardia     Neuro/Psych    GI/Hepatic   Endo/Other    Renal/GU      Musculoskeletal   Abdominal   Peds  Hematology   Anesthesia Other Findings   Reproductive/Obstetrics                            Anesthesia Physical Anesthesia Plan  ASA: IV and emergent  Anesthesia Plan: General   Post-op Pain Management:    Induction: Intravenous, Cricoid pressure planned and Rapid sequence  PONV Risk Score and Plan: Ondansetron and Dexamethasone  Airway Management Planned: Oral ETT  Additional Equipment:   Intra-op Plan:   Post-operative Plan: Extubation in OR  Informed Consent: I have reviewed the patients History and Physical, chart, labs and discussed the procedure including the risks, benefits and alternatives for the proposed anesthesia with the patient or authorized representative who has indicated his/her understanding and acceptance.     Dental advisory given  Plan Discussed with: CRNA and Anesthesiologist  Anesthesia Plan Comments:         Anesthesia Quick Evaluation

## 2020-10-25 NOTE — Progress Notes (Signed)
Pharmacy Antibiotic Note  Sandy Sutton is a 65 y.o. female admitted on 10/24/2020 with sepsis and UTI.  Pharmacy has been consulted for vancomycin and cefepime dosing.  Plan: Vancomycin 1500mg  then 1000mg  IV Q48H. Goal AUC 400-550.  Expected AUC 470. SCr 1.55.  Cefepime 2g IV Q12H.  Height: 5' 2.5" (158.8 cm) Weight: 75.8 kg (167 lb 1.7 oz) IBW/kg (Calculated) : 51.25  Temp (24hrs), Avg:98.6 F (37 C), Min:97.9 F (36.6 C), Max:100 F (37.8 C)  Recent Labs  Lab 10/24/20 1543 10/24/20 2320  WBC 40.6*  --   CREATININE 1.55*  --   LATICACIDVEN  --  1.0    Estimated Creatinine Clearance: 34.4 mL/min (A) (by C-G formula based on SCr of 1.55 mg/dL (H)).    Allergies  Allergen Reactions  . Darvon Other (See Comments)    Hallucinations  . Propoxyphene Other (See Comments)    Hallucinations  . Erythromycin Nausea And Vomiting and Rash  . Hydrocodone Nausea And Vomiting    Thank you for allowing pharmacy to be a part of this patient's care.  Wynona Neat, PharmD, BCPS  10/25/2020 5:45 AM

## 2020-10-25 NOTE — H&P (Signed)
History and Physical    Lashala Laser VFI:[id number] DOB: 1956/03/29 DOA: 10/24/2020  PCP: Lucianne Lei, MD Patient coming from: Home  Chief Complaint: Abdominal pain  HPI: Sandy Sutton is a 65 y.o. female with medical history significant of arthritis, GERD, gout presented to the ED with complaints of abdominal pain, nausea, vomiting, and diarrhea.  In the ED, tachycardic but not hypotensive.  Labs showing WBC 40.6, hemoglobin 10.6 (stable), platelet count 363K.  Sodium 132, potassium 4.0, chloride 98, bicarb 25, BUN 27, creatinine 1.5, glucose 102.  No significant elevation of LFTs.  Lipase normal.  UA with large amount of leukocytes and greater than 50 WBCs.  Urine culture pending.  Screening COVID and influenza PCR negative.  Blood culture x2 pending.  Lactic acid 1.0.  CT abdomen pelvis showing a 9 mm right UPJ stone with mild fullness of the right renal collecting system.  Patient was given ceftriaxone and 1 L fluid bolus.  Urology was consulted and patient underwent right ureteral stent placement.  Patient reports 2-week history of right-sided flank pain radiating to her abdomen associated with nausea, vomiting, and chills.  Denies history of kidney stones.  She is fully vaccinated against COVID.  States she now feels much better after the procedure.  Her pain has improved significantly and she is no longer nauseous.  No other complaints.  Review of Systems:  All systems reviewed and apart from history of presenting illness, are negative.  Past Medical History:  Diagnosis Date  . Arthritis    hands and feet  . GERD (gastroesophageal reflux disease)    doesn't take any meds  . Gout    hx of  . History of bronchitis 2015  . History of colon polyps benign  . History of gastric ulcer   . History of migraine   . Joint pain   . Joint swelling    bil knee and wrist  . Pneumonia    hx of-at least 67yrs ago  . Urinary frequency   . Urinary urgency   . Weakness    right hand  and pt states from pinched nerve    Past Surgical History:  Procedure Laterality Date  . Colonosscopy    . ESOPHAGOGASTRODUODENOSCOPY    . KNEE ARTHROPLASTY Left 03/13/2015   Procedure: COMPUTER ASSISTED TOTAL KNEE ARTHROPLASTY; LEFT KNEE;  Surgeon: [name], MD;  Location: [facility];  Service: Orthopedics;  Laterality: Left;  . KNEE ARTHROSCOPY Left 2015  . THYROID LOBECTOMY Right 08/19/2012   Procedure: THYROID LOBECTOMY;  Surgeon: [name], MD;  Location: [facility];  Service: General;  Laterality: Right;  . TUBAL LIGATION  1995     reports that she has never smoked. She has never used smokeless tobacco. She reports current alcohol use. She reports that she does not use drugs.  Allergies  Allergen Reactions  . Darvon Other (See Comments)    Hallucinations  . Propoxyphene Other (See Comments)    Hallucinations  . Erythromycin Nausea And Vomiting and Rash  . Hydrocodone Nausea And Vomiting    Family History  Problem Relation Age of Onset  . Alzheimer's disease Father   . Cancer Mother   . Hypertension Sister   . Breast cancer Sister   . Lupus Sister   . Cancer Brother     Prior to Admission medications   Medication Sig Start Date End Date Taking? Authorizing Provider  amoxicillin (AMOXIL) 500 MG capsule Take 500 mg by mouth 3 (three) times daily.  [provider]  aspirin EC 325 MG EC tablet Take 1 tablet (325 mg total) by mouth daily. 03/15/15   Lanae Crumbly, PA-C  fexofenadine-pseudoephedrine (ALLEGRA-D 24) 180-240 MG 24 hr tablet Take 1 tablet by mouth daily.    [provider]  HYDROcodone-acetaminophen (NORCO/VICODIN) 5-325 MG tablet Take 1 tablet by mouth every 6 (six) hours as needed for moderate pain.    [provider]  ibuprofen (ADVIL) 400 MG tablet Take 400 mg by mouth every 4 (four) hours as needed. 03/19/20   [provider]  methocarbamol (ROBAXIN) 500 MG tablet Take 1 tablet (500 mg total) by mouth every 8 (eight) hours  as needed for muscle spasms. DO NOT [place] ALCOHOL OR DRIVE WHILE TAKING THIS MEDICINE 03/24/20   Volney American, PA-C  oxyCODONE-acetaminophen (PERCOCET) 7.5-325 MG tablet Take 1 tablet by mouth every 4 (four) hours as needed for severe pain. 03/15/15   Lanae Crumbly, PA-C  predniSONE (DELTASONE) 10 MG tablet Take 6 tabs day one, 5 tabs day two, 4 tabs day three, etc 03/24/20   Volney American, [place]    Physical Exam: Vitals:   10/25/20 0115 10/25/20 0130 10/25/20 0157 10/25/20 0504  BP: 113/63 111/60 111/64 (!) 105/55  Pulse: (!) 101 99 99 79  Resp: 17 17 18 16   Temp:  98.1 F (36.7 C) 98.3 F (36.8 C) 97.9 F (36.6 C)  TempSrc:   Oral Oral  SpO2: 95% 95% 100% 97%  Weight:   75.8 kg   Height:   5' 2.5" (1.588 m)     Physical Exam Constitutional:      General: She is not in acute distress. HENT:     Head: Normocephalic and atraumatic.  Eyes:     Extraocular Movements: Extraocular movements intact.     Conjunctiva/sclera: Conjunctivae normal.  Cardiovascular:     Rate and Rhythm: Normal rate and regular rhythm.     Pulses: Normal pulses.  Pulmonary:     Effort: Pulmonary effort is normal. No respiratory distress.     Breath sounds: Normal breath sounds. No wheezing or rales.  Abdominal:     General: Bowel sounds are normal. There is no distension.     Palpations: Abdomen is soft.     Tenderness: There is abdominal tenderness. There is no guarding or rebound.     Comments: Right upper and lower quadrant abdominal tenderness  Musculoskeletal:        General: No swelling or tenderness.     Cervical back: Normal range of motion and neck supple.  Skin:    General: Skin is warm and dry.  Neurological:     General: No focal deficit present.     Mental Status: She is alert and oriented to person, place, and time.     Labs on Admission: I have personally reviewed following labs and imaging studies  CBC: Recent Labs  Lab 10/24/20 1543  WBC 40.6*  NEUTROABS  37.4*  HGB 10.6*  HCT 33.5*  MCV 92.3  PLT 540   Basic Metabolic Panel: Recent Labs  Lab 10/24/20 1543  NA 132*  K 4.0  CL 98  CO2 25  GLUCOSE 102*  BUN 27*  CREATININE 1.55*  CALCIUM 8.9   GFR: Estimated Creatinine Clearance: 34.4 mL/min (A) (by C-G formula based on SCr of 1.55 mg/dL (H)). Liver Function Tests: Recent Labs  Lab 10/24/20 1543  AST 29  ALT 28  ALKPHOS 116  BILITOT 1.3*  PROT 7.9  ALBUMIN 2.8*   Recent Labs  Lab 10/24/20 1543  LIPASE 24   No results for input(s): AMMONIA in the last 168 hours. Coagulation Profile: No results for input(s): INR, PROTIME in the last 168 hours. Cardiac Enzymes: No results for input(s): CKTOTAL, CKMB, CKMBINDEX, TROPONINI in the last 168 hours. BNP (last 3 results) No results for input(s): PROBNP in the last 8760 hours. HbA1C: No results for input(s): HGBA1C in the last 72 hours. CBG: No results for input(s): GLUCAP in the last 168 hours. Lipid Profile: No results for input(s): CHOL, HDL, LDLCALC, TRIG, CHOLHDL, LDLDIRECT in the last 72 hours. Thyroid Function Tests: No results for input(s): TSH, T4TOTAL, FREET4, T3FREE, THYROIDAB in the last 72 hours. Anemia Panel: No results for input(s): VITAMINB12, FOLATE, FERRITIN, TIBC, IRON, RETICCTPCT in the last 72 hours. Urine analysis:    Component Value Date/Time   COLORURINE AMBER (A) 10/24/2020 1728   APPEARANCEUR CLOUDY (A) 10/24/2020 1728   LABSPEC 1.020 10/24/2020 1728   PHURINE 5.0 10/24/2020 1728   GLUCOSEU NEGATIVE 10/24/2020 1728   HGBUR MODERATE (A) 10/24/2020 1728   BILIRUBINUR NEGATIVE 10/24/2020 1728   KETONESUR 20 (A) 10/24/2020 1728   PROTEINUR 100 (A) 10/24/2020 1728   UROBILINOGEN 1.0 03/06/2015 1249   NITRITE NEGATIVE 10/24/2020 1728   LEUKOCYTESUR LARGE (A) 10/24/2020 1728    Radiological Exams on Admission: CT ABDOMEN PELVIS WO CONTRAST  Result Date: 10/24/2020 CLINICAL DATA:  64 year old female with abdominal pain and fever. EXAM: CT  ABDOMEN AND PELVIS WITHOUT CONTRAST TECHNIQUE: Multidetector CT imaging of the abdomen and pelvis was performed following the standard protocol without IV contrast. COMPARISON:  CT abdomen pelvis dated 09/08/2010. FINDINGS: Evaluation of this exam is limited in the absence of intravenous contrast. Lower chest: Minimal bibasilar linear atelectasis/scarring. The visualized lung bases are otherwise clear. No intra-abdominal free air or free fluid. Hepatobiliary: Small calcific focus over the left lobe of the liver. The liver is otherwise unremarkable. No intrahepatic biliary dilatation. The gallbladder is unremarkable. Pancreas: Unremarkable. No pancreatic ductal dilatation or surrounding inflammatory changes. Spleen: Normal in size without focal abnormality. Adrenals/Urinary Tract: The adrenal glands unremarkable. There is a 9 mm stone in the right renal pelvis at the ureteropelvic junction. There is mild fullness of the right renal collecting system with mild right perinephric stranding. The left kidney, left ureter, and urinary bladder appear unremarkable. Stomach/Bowel: There is no bowel obstruction or active inflammation. The appendix is normal. Vascular/Lymphatic: Mild atherosclerotic calcification of the abdominal aorta. The IVC is unremarkable. No portal venous gas. There is no adenopathy. Reproductive: The uterus is anteverted and grossly unremarkable. No adnexal masses Other: None Musculoskeletal: No acute or significant osseous findings. IMPRESSION: 1. A 9 mm right UPJ stone with mild fullness of the right renal collecting system. 2. No bowel obstruction. Normal appendix. 3. Aortic Atherosclerosis (ICD10-I70.0). Electronically Signed   By: Anner Crete M.D.   On: 10/24/2020 20:51   DG Retrograde Pyelogram  Result Date: 10/25/2020 CLINICAL DATA:  65 year old female with retrograde pyelogram and right sided stent. EXAM: RETROGRADE PYELOGRAM COMPARISON:  None. FINDINGS: Four intraoperative fluoroscopic  images provided. The total fluoroscopic time is 26 seconds. A right-sided ureteral stent is noted. Contrast injected through the stent opacifies the renal collecting system. IMPRESSION: Right ureteral stent placement. Electronically Signed   By: Anner Crete M.D.   On: 10/25/2020 01:11    EKG: Pending at this time.  Assessment/Plan Principal Problem:   Complicated UTI (urinary tract infection) Active Problems:   Sepsis ([place])  AKI (acute kidney injury) ([place])  Sepsis secondary to complicated UTI/infected kidney stone Patient presented with a 2-week history of right-sided flank pain radiating to her abdomen and associated with nausea, vomiting, and chills.  Meets criteria for sepsis with tachycardia and significant leukocytosis.  No lactic acidosis or hypotension to suggest severe sepsis.  Urinalysis with evidence of pyuria.  CT showing a 9 mm right UPJ stone with mild fullness of the right renal collecting system.  She underwent right ureteral stent placement tonight.  Tachycardia has now resolved. -Continue coverage with broad-spectrum antibiotics at this time including vancomycin and cefepime.  Urine and blood cultures pending.  Continue to monitor WBC count.  Urology following.  AKI Secondary to obstructive uropathy.  BUN 27, creatinine 1.5 (baseline 0.7-1.0).  Underwent right ureteral stent placement tonight. -Foley catheter in place.  Continue to monitor urine output and renal function.  DVT prophylaxis: SCDs at this time Code Status: Patient wishes to be full code. Family Communication: No family available at this time. Disposition Plan: Status is: Inpatient  Remains inpatient appropriate because:Inpatient level of care appropriate due to severity of illness   Dispo: The patient is from: Home              Anticipated d/c is to: Home              Patient currently is not medically stable to d/c.   Difficult to place patient No   Level of care: Level of care: Telemetry Medical    The medical decision making on this patient was of high complexity and the patient is at high risk for clinical deterioration, therefore this is a level 3 visit.  Shela Leff MD [facility]  If 7PM-7AM, please contact night-coverage [email or web address]  10/25/2020, 6:00 AM

## 2020-10-25 NOTE — Anesthesia Postprocedure Evaluation (Signed)
Anesthesia Post Note  Patient: Sandy Sutton  Procedure(s) Performed: CYSTOSCOPY WITH RETROGRADE PYELOGRAM/URETERAL STENT PLACEMENT (Right Bladder)     Patient location during evaluation: PACU Anesthesia Type: General Level of consciousness: awake and alert Pain management: pain level controlled Vital Signs Assessment: post-procedure vital signs reviewed and stable Respiratory status: spontaneous breathing, nonlabored ventilation, respiratory function stable and patient connected to nasal cannula oxygen Cardiovascular status: blood pressure returned to baseline and stable Postop Assessment: no apparent nausea or vomiting Anesthetic complications: no   No complications documented.  Last Vitals:  Vitals:   10/25/20 0157 10/25/20 0504  BP: 111/64 (!) 105/55  Pulse: 99 79  Resp: 18 16  Temp: 36.8 C 36.6 C  SpO2: 100% 97%    Last Pain:  Vitals:   10/25/20 0504  TempSrc: Oral  PainSc:                  Sandy Sutton COKER

## 2020-10-26 DIAGNOSIS — N390 Urinary tract infection, site not specified: Secondary | ICD-10-CM | POA: Diagnosis not present

## 2020-10-26 LAB — BASIC METABOLIC PANEL
Anion gap: 8 (ref 5–15)
BUN: 24 mg/dL — ABNORMAL HIGH (ref 8–23)
CO2: 22 mmol/L (ref 22–32)
Calcium: 8.8 mg/dL — ABNORMAL LOW (ref 8.9–10.3)
Chloride: 105 mmol/L (ref 98–111)
Creatinine, Ser: 1.21 mg/dL — ABNORMAL HIGH (ref 0.44–1.00)
GFR, Estimated: 49 mL/min — ABNORMAL LOW (ref >60)
Glucose, Bld: 108 mg/dL — ABNORMAL HIGH (ref 70–99)
Potassium: 4.2 mmol/L (ref 3.5–5.1)
Sodium: 135 mmol/L (ref 135–145)

## 2020-10-26 LAB — CBC WITH DIFFERENTIAL/PLATELET
Abs Immature Granulocytes: 0.24 10*3/uL — ABNORMAL HIGH (ref 0.00–0.07)
Basophils Absolute: 0 10*3/uL (ref 0.0–0.1)
Basophils Relative: 0 %
Eosinophils Absolute: 0 10*3/uL (ref 0.0–0.5)
Eosinophils Relative: 0 %
HCT: 28.6 % — ABNORMAL LOW (ref 36.0–46.0)
Hemoglobin: 9.2 g/dL — ABNORMAL LOW (ref 12.0–15.0)
Immature Granulocytes: 1 %
Lymphocytes Relative: 6 %
Lymphs Abs: 1.4 10*3/uL (ref 0.7–4.0)
MCH: 29.3 pg (ref 26.0–34.0)
MCHC: 32.2 g/dL (ref 30.0–36.0)
MCV: 91.1 fL (ref 80.0–100.0)
Monocytes Absolute: 0.8 10*3/uL (ref 0.1–1.0)
Monocytes Relative: 3 %
Neutro Abs: 21.7 10*3/uL — ABNORMAL HIGH (ref 1.7–7.7)
Neutrophils Relative %: 90 %
Platelets: 362 10*3/uL (ref 150–400)
RBC: 3.14 MIL/uL — ABNORMAL LOW (ref 3.87–5.11)
RDW: 13.5 % (ref 11.5–15.5)
WBC: 24.1 10*3/uL — ABNORMAL HIGH (ref 4.0–10.5)
nRBC: 0 % (ref 0.0–0.2)

## 2020-10-26 MED ORDER — ONDANSETRON HCL 4 MG/2ML IJ SOLN: 4.0000 mg | Freq: Four times a day (QID) | INTRAMUSCULAR | Status: DC | PRN

## 2020-10-26 MED ORDER — CALCIUM CARBONATE ANTACID 500 MG PO CHEW
400.0000 mg | CHEWABLE_TABLET | Freq: Two times a day (BID) | ORAL | Status: DC
  Administered 2020-10-26 – 2020-10-29 (×7): 400 mg via ORAL
  Filled 2020-10-26 (×7): qty 2

## 2020-10-26 MED ORDER — ONDANSETRON HCL 4 MG/2ML IJ SOLN
4.0000 mg | Freq: Once | INTRAMUSCULAR | Status: AC
  Administered 2020-10-26: 4 mg via INTRAVENOUS
  Filled 2020-10-26: qty 2

## 2020-10-26 MED ORDER — ONDANSETRON HCL 4 MG/2ML IJ SOLN: 4.0000 mg | Freq: Four times a day (QID) | INTRAMUSCULAR | Status: DC

## 2020-10-26 NOTE — Progress Notes (Signed)
PROGRESS NOTE    Sandy Sutton  VEL:[id number] DOB: [birth year] DOA: 10/24/2020 PCP: Lucianne Lei, MD   Brief Narrative:  Sandy Sutton is a 65 y.o. female with medical history significant of arthritis, GERD, gout presented to the ED with complaints of abdominal pain, nausea, vomiting, and diarrhea.  In the ED, tachycardic but not hypotensive.  Labs showing WBC 40.6,  UA with large amount of leukocytes and greater than 50 WBCs.  Screening COVID and influenza PCR negative. CT abdomen pelvis showing a 9 mm right UPJ stone with mild fullness of the right renal collecting system.  Patient was given ceftriaxone and 1 L fluid bolus.  Urology was consulted and patient underwent right ureteral stent placement on 10/24/2020 and then was admitted to hospital service due to sepsis secondary to complicated UTI/infected nephrolithiasis.  Assessment & Plan:   Principal Problem:   Complicated UTI (urinary tract infection) Active Problems:   Sepsis ([place])   AKI (acute kidney injury) ([place])   Sepsis secondary to complicated UTI/infected nephrolithiasis, POA: Met criteria for sepsis with tachycardia and significant leukocytosis.  Urinalysis with evidence of pyuria.  CT showing a 9 mm right UPJ stone with mild fullness of the right renal collecting system.  She underwent right ureteral stent placement by urology.  Some improvement in leukocytosis, she has remained afebrile however today she does not feel well.  She looks weaker than yesterday.  Seems to have some facial and periorbital edema as well.  Has some nausea.  No shortness of breath.  For some unknown reason, urine was never sent for culture.  It was ordered 2 days ago.  I spoke to the primary nurse and requested to send the sample as soon as possible.  There is a great chance that we may have negative urine culture since she has been on antibiotics for 2 days.  This will make antibiotic choice very hard.  Blood culture has remained negative.  Continue  cefepime.  AKI/obstructive uropathy: Slight improvement in creatinine.  No more IV fluids since she has facial edema.  Nausea/vomiting: No vomiting but nausea.  Continue as needed Zofran.  GERD: Complains of reflux.  Continue PPI.  Start on Tums.  Chronic anemia: Hemoglobin is stable.  DVT prophylaxis: SCDs Start: 10/25/20 0537   Code Status: Full Code  Family Communication: None present at bedside.  Plan of care discussed with patient in length and he verbalized understanding and agreed with it.  Status is: Inpatient  Remains inpatient appropriate because:Inpatient level of care appropriate due to severity of illness   Dispo: The patient is from: Home              Anticipated d/c is to: Home              Patient currently is not medically stable to d/c.   Difficult to place patient No        Estimated body mass index is 30.08 kg/m as calculated from the following:   Height as of this encounter: 5' 2.5" (1.588 m).   Weight as of this encounter: 75.8 kg.      Nutritional status:               Consultants:   Urology  Procedures:   As above  Antimicrobials:  Anti-infectives (From admission, onward)   Start     Dose/Rate Route Frequency Ordered Stop   10/27/20 0600  vancomycin (VANCOREADY) IVPB 1000 mg/200 mL  Status:  Discontinued  1,000 mg 200 mL/hr over 60 Minutes Intravenous Every 48 hours 10/25/20 0547 10/25/20 1034   10/25/20 2200  cefTRIAXone (ROCEPHIN) 1 g in sodium chloride 0.9 % 100 mL IVPB  Status:  Discontinued        1 g 200 mL/hr over 30 Minutes Intravenous Every 24 hours 10/25/20 0038 10/25/20 0537   10/25/20 0800  ceFEPIme (MAXIPIME) 2 g in sodium chloride 0.9 % 100 mL IVPB        2 g 200 mL/hr over 30 Minutes Intravenous Every 12 hours 10/25/20 0547     10/25/20 0645  vancomycin (VANCOREADY) IVPB 1500 mg/300 mL        1,500 mg 150 mL/hr over 120 Minutes Intravenous  Once 10/25/20 0547 10/25/20 0842   10/25/20 0045   cefTRIAXone (ROCEPHIN) 2 g in sodium chloride 0.9 % 100 mL IVPB  Status:  Discontinued        2 g 200 mL/hr over 30 Minutes Intravenous Every 24 hours 10/25/20 0037 10/25/20 0038   10/24/20 2215  cefTRIAXone (ROCEPHIN) 2 g in sodium chloride 0.9 % 100 mL IVPB        2 g 200 mL/hr over 30 Minutes Intravenous  Once 10/24/20 2205 10/24/20 2340         Subjective: Seen and examined.  She states that she just does not feel well today.  Feels weak and has some nausea.  Also complains of GERD symptoms and right lower quadrant pain.  Objective: Vitals:   10/25/20 1813 10/25/20 2011 10/26/20 0005 10/26/20 0438  BP: (!) 106/53 (!) 113/58 (!) 119/57 114/69  Pulse: 85 64 64 62  Resp: _0 Temp: 97.6 F (36.4 C) 97.8 F (36.6 C) 97.9 F (36.6 C) 98.3 F (36.8 C)  TempSrc: Oral Oral Oral Oral  SpO2: 99% 99% 98% 99%  Weight:      Height:        Intake/Output Summary (Last 24 hours) at 10/26/2020 1027 Last data filed at 10/26/2020 0439 Gross per 24 hour  Intake 953.16 ml  Output 450 ml  Net 503.16 ml   Filed Weights   10/24/20 2217 10/25/20 0157  Weight: 73.9 kg 75.8 kg    Examination:  General exam: Appears calm and comfortable, mild facial and periorbital edema Respiratory system: Clear to auscultation. Respiratory effort normal. Cardiovascular system: S1 & S2 heard, RRR. No JVD, murmurs, rubs, gallops or clicks. No pedal edema. Gastrointestinal system: Abdomen is nondistended, soft and right CVA tenderness and right lower quadrant tenderness. No organomegaly or masses felt. Normal bowel sounds heard. Central nervous system: Alert and oriented. No focal neurological deficits. Extremities: Symmetric 5 x 5 power. Skin: No rashes, lesions or ulcers.  Psychiatry: Judgement and insight appear normal. Mood & affect appropriate.    Data Reviewed: I have personally reviewed following labs and imaging studies  CBC: Recent Labs  Lab 10/24/20 1543 10/25/20 0549  10/26/20 0509  WBC 40.6* 28.7* 24.1*  NEUTROABS 37.4*  --  21.7*  HGB 10.6* 9.4* 9.2*  HCT 33.5* 29.0* 28.6*  MCV 92.3 90.1 91.1  PLT 363 320 509   Basic Metabolic Panel: Recent Labs  Lab 10/24/20 1543 10/25/20 0549 10/26/20 0509  NA 132* 135 135  K 4.0 4.4 4.2  CL 98 102 105  CO2 _1 GLUCOSE 102* 143* 108*  BUN 27* 25* 24*  CREATININE 1.55* 1.31* 1.21*  CALCIUM 8.9 9.0 8.8*   GFR: Estimated Creatinine Clearance: 44.1 mL/min (A) (by C-G  formula based on SCr of 1.21 mg/dL (H)). Liver Function Tests: Recent Labs  Lab 10/24/20 1543  AST 29  ALT 28  ALKPHOS 116  BILITOT 1.3*  PROT 7.9  ALBUMIN 2.8*   Recent Labs  Lab 10/24/20 1543  LIPASE 24   No results for input(s): AMMONIA in the last 168 hours. Coagulation Profile: No results for input(s): INR, PROTIME in the last 168 hours. Cardiac Enzymes: No results for input(s): CKTOTAL, CKMB, CKMBINDEX, TROPONINI in the last 168 hours. BNP (last 3 results) No results for input(s): PROBNP in the last 8760 hours. HbA1C: No results for input(s): HGBA1C in the last 72 hours. CBG: No results for input(s): GLUCAP in the last 168 hours. Lipid Profile: No results for input(s): CHOL, HDL, LDLCALC, TRIG, CHOLHDL, LDLDIRECT in the last 72 hours. Thyroid Function Tests: No results for input(s): TSH, T4TOTAL, FREET4, T3FREE, THYROIDAB in the last 72 hours. Anemia Panel: No results for input(s): VITAMINB12, FOLATE, FERRITIN, TIBC, IRON, RETICCTPCT in the last 72 hours. Sepsis Labs: Recent Labs  Lab 10/24/20 2320  LATICACIDVEN 1.0    Recent Results (from the past 240 hour(s))  Resp Panel by RT-PCR (Flu A&B, Covid) Nasopharyngeal Swab     Status: None   Collection Time: 10/24/20 10:20 PM   Specimen: Nasopharyngeal Swab; Nasopharyngeal(NP) swabs in vial transport medium  Result Value Ref Range Status   SARS Coronavirus 2 by RT PCR NEGATIVE NEGATIVE Final    Comment: (NOTE) SARS-CoV-2 target nucleic acids are NOT  DETECTED.  The SARS-CoV-2 RNA is generally detectable in upper respiratory specimens during the acute phase of infection. The lowest concentration of SARS-CoV-2 viral copies this assay can detect is 138 copies/mL. A negative result does not preclude SARS-Cov-2 infection and should not be used as the sole basis for treatment or other patient management decisions. A negative result may occur with  improper specimen collection/handling, submission of specimen other than nasopharyngeal swab, presence of viral mutation(s) within the areas targeted by this assay, and inadequate number of viral copies(<138 copies/mL). A negative result must be combined with clinical observations, patient history, and epidemiological information. The expected result is Negative.  Fact Sheet for Patients:  EntrepreneurPulse.com.au  Fact Sheet for Healthcare Providers:  IncredibleEmployment.be  This test is no t yet approved or cleared by the [place] FDA and  has been authorized for detection and/or diagnosis of SARS-CoV-2 by FDA under an Emergency Use Authorization (EUA). This EUA will remain  in effect (meaning this test can be used) for the duration of the COVID-19 declaration under Section 564(b)(1) of the Act, 21 U.S.C.section 360bbb-3(b)(1), unless the authorization is terminated  or revoked sooner.       Influenza A by PCR NEGATIVE NEGATIVE Final   Influenza B by PCR NEGATIVE NEGATIVE Final    Comment: (NOTE) The Xpert Xpress SARS-CoV-2/FLU/RSV plus assay is intended as an aid in the diagnosis of influenza from Nasopharyngeal swab specimens and should not be used as a sole basis for treatment. Nasal washings and aspirates are unacceptable for Xpert Xpress SARS-CoV-2/FLU/RSV testing.  Fact Sheet for Patients: EntrepreneurPulse.com.au  Fact Sheet for Healthcare Providers: IncredibleEmployment.be  This test is not yet  approved or cleared by the [place] FDA and has been authorized for detection and/or diagnosis of SARS-CoV-2 by FDA under an Emergency Use Authorization (EUA). This EUA will remain in effect (meaning this test can be used) for the duration of the COVID-19 declaration under Section 564(b)(1) of the Act, 21 U.S.C. section 360bbb-3(b)(1), unless the  authorization is terminated or revoked.  Performed at [facility], [place] [address] [place], [place] [address]       Radiology Studies: CT ABDOMEN PELVIS WO CONTRAST  Result Date: 10/24/2020 CLINICAL DATA:  65 year old female with abdominal pain and fever. EXAM: CT ABDOMEN AND PELVIS WITHOUT CONTRAST TECHNIQUE: Multidetector CT imaging of the abdomen and pelvis was performed following the standard protocol without IV contrast. COMPARISON:  CT abdomen pelvis dated 09/08/2010. FINDINGS: Evaluation of this exam is limited in the absence of intravenous contrast. Lower chest: Minimal bibasilar linear atelectasis/scarring. The visualized lung bases are otherwise clear. No intra-abdominal free air or free fluid. Hepatobiliary: Small calcific focus over the left lobe of the liver. The liver is otherwise unremarkable. No intrahepatic biliary dilatation. The gallbladder is unremarkable. Pancreas: Unremarkable. No pancreatic ductal dilatation or surrounding inflammatory changes. Spleen: Normal in size without focal abnormality. Adrenals/Urinary Tract: The adrenal glands unremarkable. There is a 9 mm stone in the right renal pelvis at the ureteropelvic junction. There is mild fullness of the right renal collecting system with mild right perinephric stranding. The left kidney, left ureter, and urinary bladder appear unremarkable. Stomach/Bowel: There is no bowel obstruction or active inflammation. The appendix is normal. Vascular/Lymphatic: Mild atherosclerotic calcification of the abdominal aorta. The IVC is unremarkable. No portal venous gas. There is no  adenopathy. Reproductive: The uterus is anteverted and grossly unremarkable. No adnexal masses Other: None Musculoskeletal: No acute or significant osseous findings. IMPRESSION: 1. A 9 mm right UPJ stone with mild fullness of the right renal collecting system. 2. No bowel obstruction. Normal appendix. 3. Aortic Atherosclerosis (ICD10-I70.0). Electronically Signed   By: Anner Crete M.D.   On: 10/24/2020 20:51   DG Retrograde Pyelogram  Result Date: 10/25/2020 CLINICAL DATA:  65 year old female with retrograde pyelogram and right sided stent. EXAM: RETROGRADE PYELOGRAM COMPARISON:  None. FINDINGS: Four intraoperative fluoroscopic images provided. The total fluoroscopic time is 26 seconds. A right-sided ureteral stent is noted. Contrast injected through the stent opacifies the renal collecting system. IMPRESSION: Right ureteral stent placement. Electronically Signed   By: Anner Crete M.D.   On: 10/25/2020 01:11    Scheduled Meds: . calcium carbonate  400 mg of elemental calcium Oral BID  . ondansetron (ZOFRAN) IV  4 mg Intravenous Q6H  . pantoprazole  40 mg Oral Daily   Continuous Infusions: . ceFEPime (MAXIPIME) IV 2 g (10/26/20 0854)     LOS: 2 days   Time spent: 30 minutes   Darliss Cheney, MD [facility]  10/26/2020, 10:27 AM   How to contact the [facility] Attending or Consulting provider [place] or covering provider during after hours [place], for this patient?  1. Check the care team in [facility] and look for a) attending/consulting [facility] provider listed and b) the [facility] team listed. Page or secure chat [phone]. 2. Log into [email or web address] and use [place]'s universal password to access. If you do not have the password, please contact the hospital operator. 3. Locate the [facility] provider you are looking for under [facility] and page to a number that you can be directly reached. 4. If you still have difficulty reaching the provider, please page the [facility] (Director on Call) for the  Hospitalists listed on amion for assistance.

## 2020-10-27 DIAGNOSIS — N390 Urinary tract infection, site not specified: Secondary | ICD-10-CM | POA: Diagnosis not present

## 2020-10-27 LAB — BLOOD CULTURE ID PANEL (REFLEXED) - BCID2

## 2020-10-27 LAB — BASIC METABOLIC PANEL
Anion gap: 10 (ref 5–15)
BUN: 19 mg/dL (ref 8–23)
CO2: 19 mmol/L — ABNORMAL LOW (ref 22–32)
Calcium: 8.8 mg/dL — ABNORMAL LOW (ref 8.9–10.3)
Chloride: 107 mmol/L (ref 98–111)
Creatinine, Ser: 1.04 mg/dL — ABNORMAL HIGH (ref 0.44–1.00)
GFR, Estimated: 59 mL/min — ABNORMAL LOW (ref >60)
Glucose, Bld: 97 mg/dL (ref 70–99)
Potassium: 3.8 mmol/L (ref 3.5–5.1)
Sodium: 136 mmol/L (ref 135–145)

## 2020-10-27 LAB — CBC WITH DIFFERENTIAL/PLATELET
Abs Immature Granulocytes: 0.21 10*3/uL — ABNORMAL HIGH (ref 0.00–0.07)
Basophils Absolute: 0.1 10*3/uL (ref 0.0–0.1)
Basophils Relative: 0 %
Eosinophils Absolute: 0 10*3/uL (ref 0.0–0.5)
Eosinophils Relative: 0 %
HCT: 28 % — ABNORMAL LOW (ref 36.0–46.0)
Hemoglobin: 9 g/dL — ABNORMAL LOW (ref 12.0–15.0)
Immature Granulocytes: 1 %
Lymphocytes Relative: 14 %
Lymphs Abs: 2.2 10*3/uL (ref 0.7–4.0)
MCH: 29.4 pg (ref 26.0–34.0)
MCHC: 32.1 g/dL (ref 30.0–36.0)
MCV: 91.5 fL (ref 80.0–100.0)
Monocytes Absolute: 1 10*3/uL (ref 0.1–1.0)
Monocytes Relative: 6 %
Neutro Abs: 12.7 10*3/uL — ABNORMAL HIGH (ref 1.7–7.7)
Neutrophils Relative %: 79 %
Platelets: 350 10*3/uL (ref 150–400)
RBC: 3.06 MIL/uL — ABNORMAL LOW (ref 3.87–5.11)
RDW: 13.4 % (ref 11.5–15.5)
WBC: 16.3 10*3/uL — ABNORMAL HIGH (ref 4.0–10.5)
nRBC: 0 % (ref 0.0–0.2)

## 2020-10-27 LAB — URINE CULTURE: Culture: NO GROWTH

## 2020-10-27 MED ORDER — ACETAMINOPHEN 650 MG RE SUPP: 975.0000 mg | Freq: Three times a day (TID) | RECTAL | Status: DC | PRN

## 2020-10-27 MED ORDER — ACETAMINOPHEN 500 MG PO TABS: 1000.0000 mg | ORAL_TABLET | Freq: Three times a day (TID) | ORAL | Status: DC

## 2020-10-27 MED ORDER — ACETAMINOPHEN 650 MG RE SUPP: 650.0000 mg | Freq: Three times a day (TID) | RECTAL | Status: DC

## 2020-10-27 MED ORDER — SODIUM CHLORIDE 0.9 % IV SOLN
2.0000 g | INTRAVENOUS | Status: DC
  Administered 2020-10-27 – 2020-10-28 (×2): 2 g via INTRAVENOUS
  Filled 2020-10-27 (×2): qty 20
  Filled 2020-10-27: qty 2

## 2020-10-27 MED ORDER — ACETAMINOPHEN 500 MG PO TABS
1000.0000 mg | ORAL_TABLET | Freq: Three times a day (TID) | ORAL | Status: DC | PRN
  Administered 2020-10-27 – 2020-10-28 (×3): 1000 mg via ORAL
  Filled 2020-10-27 (×3): qty 2

## 2020-10-27 MED ORDER — ACETAMINOPHEN 80 MG RE SUPP
1000.0000 mg | Freq: Three times a day (TID) | RECTAL | Status: DC | PRN
  Filled 2020-10-27: qty 2

## 2020-10-27 MED ORDER — ACETAMINOPHEN 500 MG PO TABS: 1000.0000 mg | ORAL_TABLET | Freq: Three times a day (TID) | ORAL | Status: DC | PRN

## 2020-10-27 NOTE — Progress Notes (Signed)
PROGRESS NOTE    Sandy Sutton  PYP:[id number] DOB: [birth year] DOA: 10/24/2020 PCP: Lucianne Lei, MD   Brief Narrative:  Sandy Sutton is a 65 y.o. female with medical history significant of arthritis, GERD, gout presented to the ED with complaints of abdominal pain, nausea, vomiting, and diarrhea.  In the ED, tachycardic but not hypotensive.  Labs showing WBC 40.6,  UA with large amount of leukocytes and greater than 50 WBCs.  Screening COVID and influenza PCR negative. CT abdomen pelvis showing a 9 mm right UPJ stone with mild fullness of the right renal collecting system.  Patient was given ceftriaxone and 1 L fluid bolus.  Urology was consulted and patient underwent right ureteral stent placement on 10/24/2020 and then was admitted to hospital service due to sepsis secondary to complicated UTI/infected nephrolithiasis.  Assessment & Plan:   Principal Problem:   Complicated UTI (urinary tract infection) Active Problems:   Sepsis ([place])   AKI (acute kidney injury) ([place])   Sepsis secondary to complicated UTI/infected nephrolithiasis, POA: Met criteria for sepsis with tachycardia and significant leukocytosis.  Urinalysis with evidence of pyuria.  CT showing a 9 mm right UPJ stone with mild fullness of the right renal collecting system.  She underwent right ureteral stent placement by urology.  Afebrile for last 3 days, leukocytosis improving.  She is feeling much better.  Plan was to discharge her today.  Discussed with on-call urologist Dr. Cherylann Banas and since there was no positive culture, he recommended discharging patient on Keflex.  Plan was to discharge her home however in the meantime, I was informed that 1 out of the 4 blood culture is growing gram-negative rods, likely E. coli.  Discussed case over the phone with ID on-call.  He recommended keeping patient in the hospital, switching to Rocephin 2 g IV.  Plan changed, discharge canceled.  Updated patient.  She was not happy but she  understands that it is in her best interest to stay in the hospital.  Gram-negative bacteremia: Details above, switching to Rocephin IV 2 g.  Await further identification and sensitivities.  AKI/obstructive uropathy: Significant improvement.  Monitor.  Nausea/vomiting: No vomiting but nausea.  Continue as needed Zofran.  GERD: Continue PPI and Tums.  Chronic anemia: Hemoglobin is stable.  DVT prophylaxis: SCDs Start: 10/25/20 0537   Code Status: Full Code  Family Communication: None present at bedside.  Plan of care discussed with patient in length and he verbalized understanding and agreed with it.  Status is: Inpatient  Remains inpatient appropriate because:Inpatient level of care appropriate due to severity of illness   Dispo: The patient is from: Home              Anticipated d/c is to: Home              Patient currently is not medically stable to d/c.   Difficult to place patient No        Estimated body mass index is 30.08 kg/m as calculated from the following:   Height as of this encounter: 5' 2.5" (1.588 m).   Weight as of this encounter: 75.8 kg.      Nutritional status:               Consultants:   Urology  Procedures:   As above  Antimicrobials:  Anti-infectives (From admission, onward)   Start     Dose/Rate Route Frequency Ordered Stop   10/27/20 0958  cefTRIAXone (ROCEPHIN) 2 g in sodium chloride 0.9 % 100  mL IVPB        2 g 200 mL/hr over 30 Minutes Intravenous Every 24 hours 10/27/20 0958     10/27/20 0600  vancomycin (VANCOREADY) IVPB 1000 mg/200 mL  Status:  Discontinued        1,000 mg 200 mL/hr over 60 Minutes Intravenous Every 48 hours 10/25/20 0547 10/25/20 1034   10/25/20 2200  cefTRIAXone (ROCEPHIN) 1 g in sodium chloride 0.9 % 100 mL IVPB  Status:  Discontinued        1 g 200 mL/hr over 30 Minutes Intravenous Every 24 hours 10/25/20 0038 10/25/20 0537   10/25/20 0800  ceFEPIme (MAXIPIME) 2 g in sodium chloride 0.9 % 100  mL IVPB  Status:  Discontinued        2 g 200 mL/hr over 30 Minutes Intravenous Every 12 hours 10/25/20 0547 10/27/20 0958   10/25/20 0645  vancomycin (VANCOREADY) IVPB 1500 mg/300 mL        1,500 mg 150 mL/hr over 120 Minutes Intravenous  Once 10/25/20 0547 10/25/20 0842   10/25/20 0045  cefTRIAXone (ROCEPHIN) 2 g in sodium chloride 0.9 % 100 mL IVPB  Status:  Discontinued        2 g 200 mL/hr over 30 Minutes Intravenous Every 24 hours 10/25/20 0037 10/25/20 0038   10/24/20 2215  cefTRIAXone (ROCEPHIN) 2 g in sodium chloride 0.9 % 100 mL IVPB        2 g 200 mL/hr over 30 Minutes Intravenous  Once 10/24/20 2205 10/24/20 2340         Subjective: Seen and examined.  She feels much better.  No complaints.  Eager to go home.  Objective: Vitals:   10/26/20 1157 10/26/20 1741 10/27/20 0008 10/27/20 0545  BP: (!) 116/53 (!) 116/54 (!) 116/51 (!) 114/54  Pulse: 93 82 83 86  Resp: 16 16 18 18   Temp: 97.9 F (36.6 C) 97.9 F (36.6 C) 98.2 F (36.8 C) 98.7 F (37.1 C)  TempSrc: Oral Oral Oral Oral  SpO2: 98% 99% 98% 97%  Weight:      Height:       No intake or output data in the 24 hours ending 10/27/20 1139 Filed Weights   10/24/20 2217 10/25/20 0157  Weight: 73.9 kg 75.8 kg    Examination:  General exam: Appears calm and comfortable  Respiratory system: Clear to auscultation. Respiratory effort normal. Cardiovascular system: S1 & S2 heard, RRR. No JVD, murmurs, rubs, gallops or clicks. No pedal edema. Gastrointestinal system: Abdomen is nondistended, soft and mild right lower quadrant tenderness and right CVA tenderness. No organomegaly or masses felt. Normal bowel sounds heard. Central nervous system: Alert and oriented. No focal neurological deficits. Extremities: Symmetric 5 x 5 power. Skin: No rashes, lesions or ulcers.  Psychiatry: Judgement and insight appear normal. Mood & affect appropriate.   Data Reviewed: I have personally reviewed following labs and imaging  studies  CBC: Recent Labs  Lab 10/24/20 1543 10/25/20 0549 10/26/20 0509 10/27/20 0319  WBC 40.6* 28.7* 24.1* 16.3*  NEUTROABS 37.4*  --  21.7* 12.7*  HGB 10.6* 9.4* 9.2* 9.0*  HCT 33.5* 29.0* 28.6* 28.0*  MCV 92.3 90.1 91.1 91.5  PLT 363 320 362 275   Basic Metabolic Panel: Recent Labs  Lab 10/24/20 1543 10/25/20 0549 10/26/20 0509 10/27/20 0319  NA 132* 135 135 136  K 4.0 4.4 4.2 3.8  CL 98 102 105 107  CO2 25 23 22  19*  GLUCOSE 102* 143* 108* 97  BUN 27* 25* 24* 19  CREATININE 1.55* 1.31* 1.21* 1.04*  CALCIUM 8.9 9.0 8.8* 8.8*   GFR: Estimated Creatinine Clearance: 51.3 mL/min (A) (by C-G formula based on SCr of 1.04 mg/dL (H)). Liver Function Tests: Recent Labs  Lab 10/24/20 1543  AST 29  ALT 28  ALKPHOS 116  BILITOT 1.3*  PROT 7.9  ALBUMIN 2.8*   Recent Labs  Lab 10/24/20 1543  LIPASE 24   No results for input(s): AMMONIA in the last 168 hours. Coagulation Profile: No results for input(s): INR, PROTIME in the last 168 hours. Cardiac Enzymes: No results for input(s): CKTOTAL, CKMB, CKMBINDEX, TROPONINI in the last 168 hours. BNP (last 3 results) No results for input(s): PROBNP in the last 8760 hours. HbA1C: No results for input(s): HGBA1C in the last 72 hours. CBG: No results for input(s): GLUCAP in the last 168 hours. Lipid Profile: No results for input(s): CHOL, HDL, LDLCALC, TRIG, CHOLHDL, LDLDIRECT in the last 72 hours. Thyroid Function Tests: No results for input(s): TSH, T4TOTAL, FREET4, T3FREE, THYROIDAB in the last 72 hours. Anemia Panel: No results for input(s): VITAMINB12, FOLATE, FERRITIN, TIBC, IRON, RETICCTPCT in the last 72 hours. Sepsis Labs: Recent Labs  Lab 10/24/20 2320  LATICACIDVEN 1.0    Recent Results (from the past 240 hour(s))  Resp Panel by RT-PCR (Flu A&B, Covid) Nasopharyngeal Swab     Status: None   Collection Time: 10/24/20 10:20 PM   Specimen: Nasopharyngeal Swab; Nasopharyngeal(NP) swabs in vial transport  medium  Result Value Ref Range Status   SARS Coronavirus 2 by RT PCR NEGATIVE NEGATIVE Final    Comment: (NOTE) SARS-CoV-2 target nucleic acids are NOT DETECTED.  The SARS-CoV-2 RNA is generally detectable in upper respiratory specimens during the acute phase of infection. The lowest concentration of SARS-CoV-2 viral copies this assay can detect is 138 copies/mL. A negative result does not preclude SARS-Cov-2 infection and should not be used as the sole basis for treatment or other patient management decisions. A negative result may occur with  improper specimen collection/handling, submission of specimen other than nasopharyngeal swab, presence of viral mutation(s) within the areas targeted by this assay, and inadequate number of viral copies(<138 copies/mL). A negative result must be combined with clinical observations, patient history, and epidemiological information. The expected result is Negative.  Fact Sheet for Patients:  EntrepreneurPulse.com.au  Fact Sheet for Healthcare Providers:  IncredibleEmployment.be  This test is no t yet approved or cleared by the [place] FDA and  has been authorized for detection and/or diagnosis of SARS-CoV-2 by FDA under an Emergency Use Authorization (EUA). This EUA will remain  in effect (meaning this test can be used) for the duration of the COVID-19 declaration under Section 564(b)(1) of the Act, 21 U.S.C.section 360bbb-3(b)(1), unless the authorization is terminated  or revoked sooner.       Influenza A by PCR NEGATIVE NEGATIVE Final   Influenza B by PCR NEGATIVE NEGATIVE Final    Comment: (NOTE) The Xpert Xpress SARS-CoV-2/FLU/RSV plus assay is intended as an aid in the diagnosis of influenza from Nasopharyngeal swab specimens and should not be used as a sole basis for treatment. Nasal washings and aspirates are unacceptable for Xpert Xpress SARS-CoV-2/FLU/RSV testing.  Fact Sheet for  Patients: EntrepreneurPulse.com.au  Fact Sheet for Healthcare Providers: IncredibleEmployment.be  This test is not yet approved or cleared by the [place] FDA and has been authorized for detection and/or diagnosis of SARS-CoV-2 by FDA under an Emergency Use Authorization (EUA). This EUA will  remain in effect (meaning this test can be used) for the duration of the COVID-19 declaration under Section 564(b)(1) of the Act, 21 U.S.C. section 360bbb-3(b)(1), unless the authorization is terminated or revoked.  Performed at [facility], [place] [address] [place], [place] [address]   Blood culture (routine x 2)     Status: None (Preliminary result)   Collection Time: 10/24/20 10:57 PM   Specimen: BLOOD  Result Value Ref Range Status   Specimen Description BLOOD LEFT ANTECUBITAL  Final   Special Requests   Final    BOTTLES DRAWN AEROBIC AND ANAEROBIC Blood Culture results may not be optimal due to an inadequate volume of blood received in culture bottles   Culture  Setup Time   Final    GRAM NEGATIVE RODS AEROBIC BOTTLE ONLY CRITICAL RESULT CALLED TO, READ BACK BY AND VERIFIED WITH: J,MILLEN PHARMD @0914  10/27/20 EB Performed at [facility], [place] [address], [place], [place] [address]    Culture GRAM NEGATIVE RODS  Final   Report Status PENDING  Incomplete  Blood Culture ID Panel (Reflexed)     Status: Abnormal   Collection Time: 10/24/20 10:57 PM  Result Value Ref Range Status   Enterococcus faecalis NOT DETECTED NOT DETECTED Final   Enterococcus Faecium NOT DETECTED NOT DETECTED Final   Listeria monocytogenes NOT DETECTED NOT DETECTED Final   Staphylococcus species NOT DETECTED NOT DETECTED Final   Staphylococcus aureus (BCID) NOT DETECTED NOT DETECTED Final   Staphylococcus epidermidis NOT DETECTED NOT DETECTED Final   Staphylococcus lugdunensis NOT DETECTED NOT DETECTED Final   Streptococcus species NOT DETECTED NOT DETECTED Final    Streptococcus agalactiae NOT DETECTED NOT DETECTED Final   Streptococcus pneumoniae NOT DETECTED NOT DETECTED Final   Streptococcus pyogenes NOT DETECTED NOT DETECTED Final   A.calcoaceticus-baumannii NOT DETECTED NOT DETECTED Final   Bacteroides fragilis NOT DETECTED NOT DETECTED Final   Enterobacterales DETECTED (A) NOT DETECTED Final    Comment: Enterobacterales represent a large order of gram negative bacteria, not a single organism. CRITICAL RESULT CALLED TO, READ BACK BY AND VERIFIED WITH: J,MILLEN PHARMD @0914  10/27/20 EB    Enterobacter cloacae complex NOT DETECTED NOT DETECTED Final   Escherichia coli DETECTED (A) NOT DETECTED Final    Comment: CRITICAL RESULT CALLED TO, READ BACK BY AND VERIFIED WITH: J,MILLEN PHARMD @0914  10/27/20 EB    Klebsiella aerogenes NOT DETECTED NOT DETECTED Final   Klebsiella oxytoca NOT DETECTED NOT DETECTED Final   Klebsiella pneumoniae NOT DETECTED NOT DETECTED Final   Proteus species NOT DETECTED NOT DETECTED Final   Salmonella species NOT DETECTED NOT DETECTED Final   Serratia marcescens NOT DETECTED NOT DETECTED Final   Haemophilus influenzae NOT DETECTED NOT DETECTED Final   Neisseria meningitidis NOT DETECTED NOT DETECTED Final   Pseudomonas aeruginosa NOT DETECTED NOT DETECTED Final   Stenotrophomonas maltophilia NOT DETECTED NOT DETECTED Final   Candida albicans NOT DETECTED NOT DETECTED Final   Candida auris NOT DETECTED NOT DETECTED Final   Candida glabrata NOT DETECTED NOT DETECTED Final   Candida krusei NOT DETECTED NOT DETECTED Final   Candida parapsilosis NOT DETECTED NOT DETECTED Final   Candida tropicalis NOT DETECTED NOT DETECTED Final   Cryptococcus neoformans/gattii NOT DETECTED NOT DETECTED Final   CTX-M ESBL NOT DETECTED NOT DETECTED Final   Carbapenem resistance IMP NOT DETECTED NOT DETECTED Final   Carbapenem resistance KPC NOT DETECTED NOT DETECTED Final   Carbapenem resistance NDM NOT DETECTED NOT DETECTED Final  Carbapenem resist OXA 48 LIKE NOT DETECTED NOT DETECTED Final   Carbapenem resistance VIM NOT DETECTED NOT DETECTED Final    Comment: Performed at [facility], [place] [address] [place], [place] [address]  Blood culture (routine x 2)     Status: None (Preliminary result)   Collection Time: 10/24/20 11:06 PM   Specimen: BLOOD  Result Value Ref Range Status   Specimen Description BLOOD RIGHT ANTECUBITAL  Final   Special Requests   Final    BOTTLES DRAWN AEROBIC AND ANAEROBIC Blood Culture results may not be optimal due to an inadequate volume of blood received in culture bottles   Culture   Final    NO GROWTH 2 DAYS Performed at [facility], [place] [address] [place], [place] [address]    Report Status PENDING  Incomplete  Urine culture     Status: None   Collection Time: 10/24/20 11:38 PM   Specimen: Urine, Random  Result Value Ref Range Status   Specimen Description URINE, RANDOM  Final   Special Requests NONE  Final   Culture   Final    NO GROWTH Performed at [facility], [place] [address], [place],  [address]    Report Status 10/27/2020 FINAL  Final      Radiology Studies: No results found.  Scheduled Meds: . calcium carbonate  400 mg of elemental calcium Oral BID  . pantoprazole  40 mg Oral Daily   Continuous Infusions: . cefTRIAXone (ROCEPHIN)  IV       LOS: 3 days   Time spent: 34 minutes   Darliss Cheney, MD [facility]  10/27/2020, 11:39 AM   How to contact the [facility] Attending or Consulting provider [place] or covering provider during after hours [place], for this patient?  1. Check the care team in [facility] and look for a) attending/consulting [facility] provider listed and b) the [facility] team listed. Page or secure chat [phone]. 2. Log into [email or web address] and use [place] universal password to access. If you do not have the password, please contact the hospital operator. 3. Locate the [facility] provider you are looking for under [facility] and  page to a number that you can be directly reached. 4. If you still have difficulty reaching the provider, please page the [facility] (Director on Call) for the Hospitalists listed on amion for assistance.

## 2020-10-27 NOTE — Progress Notes (Signed)
PHARMACY - PHYSICIAN COMMUNICATION CRITICAL VALUE ALERT - BLOOD CULTURE IDENTIFICATION (BCID)  Sandy Sutton is an 65 y.o. female who presented to [facility] on 10/24/2020 with a chief complaint of abdominal pain, nausea, and vomiting with complicated UTI and bacteremia.   Assessment:  Blood culture is positive with 1 out of 4 gram negative rods. BCID showing Ecoli, no resistance detected. Susceptibilities still pending.   Name of physician (or Provider) Contacted: Dr. Doristine Bosworth  Current antibiotics: Cefepime  Changes to prescribed antibiotics recommended:  Recommendations accepted by provider - [place] to Ceftriaxone 2g IV every 24 hours.   Results for orders placed or performed during the hospital encounter of 10/24/20  Blood Culture ID Panel (Reflexed) (Collected: 10/24/2020 10:57 PM)  Result Value Ref Range   Enterococcus faecalis NOT DETECTED NOT DETECTED   Enterococcus Faecium NOT DETECTED NOT DETECTED   Listeria monocytogenes NOT DETECTED NOT DETECTED   Staphylococcus species NOT DETECTED NOT DETECTED   Staphylococcus aureus (BCID) NOT DETECTED NOT DETECTED   Staphylococcus epidermidis NOT DETECTED NOT DETECTED   Staphylococcus lugdunensis NOT DETECTED NOT DETECTED   Streptococcus species NOT DETECTED NOT DETECTED   Streptococcus agalactiae NOT DETECTED NOT DETECTED   Streptococcus pneumoniae NOT DETECTED NOT DETECTED   Streptococcus pyogenes NOT DETECTED NOT DETECTED   A.calcoaceticus-baumannii NOT DETECTED NOT DETECTED   Bacteroides fragilis NOT DETECTED NOT DETECTED   Enterobacterales DETECTED (A) NOT DETECTED   Enterobacter cloacae complex NOT DETECTED NOT DETECTED   Escherichia coli DETECTED (A) NOT DETECTED   Klebsiella aerogenes NOT DETECTED NOT DETECTED   Klebsiella oxytoca NOT DETECTED NOT DETECTED   Klebsiella pneumoniae NOT DETECTED NOT DETECTED   Proteus species NOT DETECTED NOT DETECTED   Salmonella species NOT DETECTED NOT DETECTED   Serratia marcescens NOT  DETECTED NOT DETECTED   Haemophilus influenzae NOT DETECTED NOT DETECTED   Neisseria meningitidis NOT DETECTED NOT DETECTED   Pseudomonas aeruginosa NOT DETECTED NOT DETECTED   Stenotrophomonas maltophilia NOT DETECTED NOT DETECTED   Candida albicans NOT DETECTED NOT DETECTED   Candida auris NOT DETECTED NOT DETECTED   Candida glabrata NOT DETECTED NOT DETECTED   Candida krusei NOT DETECTED NOT DETECTED   Candida parapsilosis NOT DETECTED NOT DETECTED   Candida tropicalis NOT DETECTED NOT DETECTED   Cryptococcus neoformans/gattii NOT DETECTED NOT DETECTED   CTX-M ESBL NOT DETECTED NOT DETECTED   Carbapenem resistance IMP NOT DETECTED NOT DETECTED   Carbapenem resistance KPC NOT DETECTED NOT DETECTED   Carbapenem resistance NDM NOT DETECTED NOT DETECTED   Carbapenem resist OXA 48 LIKE NOT DETECTED NOT DETECTED   Carbapenem resistance VIM NOT DETECTED NOT DETECTED    Sloan Leiter, PharmD, BCPS, BCCCP Clinical Pharmacist Please refer to [facility] for [place] numbers 10/27/2020  9:54 AM

## 2020-10-28 DIAGNOSIS — N390 Urinary tract infection, site not specified: Secondary | ICD-10-CM | POA: Diagnosis not present

## 2020-10-28 LAB — CBC WITH DIFFERENTIAL/PLATELET
Abs Immature Granulocytes: 0.54 10*3/uL — ABNORMAL HIGH (ref 0.00–0.07)
Basophils Absolute: 0.1 10*3/uL (ref 0.0–0.1)
Basophils Relative: 0 %
Eosinophils Absolute: 0.1 10*3/uL (ref 0.0–0.5)
Eosinophils Relative: 1 %
HCT: 29.4 % — ABNORMAL LOW (ref 36.0–46.0)
Hemoglobin: 9.7 g/dL — ABNORMAL LOW (ref 12.0–15.0)
Immature Granulocytes: 4 %
Lymphocytes Relative: 20 %
Lymphs Abs: 3.1 10*3/uL (ref 0.7–4.0)
MCH: 29.3 pg (ref 26.0–34.0)
MCHC: 33 g/dL (ref 30.0–36.0)
MCV: 88.8 fL (ref 80.0–100.0)
Monocytes Absolute: 0.8 10*3/uL (ref 0.1–1.0)
Monocytes Relative: 5 %
Neutro Abs: 11.1 10*3/uL — ABNORMAL HIGH (ref 1.7–7.7)
Neutrophils Relative %: 70 %
Platelets: 430 10*3/uL — ABNORMAL HIGH (ref 150–400)
RBC: 3.31 MIL/uL — ABNORMAL LOW (ref 3.87–5.11)
RDW: 13.2 % (ref 11.5–15.5)
WBC: 15.6 10*3/uL — ABNORMAL HIGH (ref 4.0–10.5)
nRBC: 0 % (ref 0.0–0.2)

## 2020-10-28 LAB — BASIC METABOLIC PANEL
Anion gap: 6 (ref 5–15)
BUN: 12 mg/dL (ref 8–23)
CO2: 25 mmol/L (ref 22–32)
Calcium: 9 mg/dL (ref 8.9–10.3)
Chloride: 104 mmol/L (ref 98–111)
Creatinine, Ser: 1.19 mg/dL — ABNORMAL HIGH (ref 0.44–1.00)
GFR, Estimated: 50 mL/min — ABNORMAL LOW (ref >60)
Glucose, Bld: 98 mg/dL (ref 70–99)
Potassium: 4 mmol/L (ref 3.5–5.1)
Sodium: 135 mmol/L (ref 135–145)

## 2020-10-28 LAB — CULTURE, BLOOD (ROUTINE X 2)

## 2020-10-28 NOTE — Progress Notes (Signed)
PROGRESS NOTE    Sandy Sutton  CBJ:[id number] DOB: February 17, 1956 DOA: 10/24/2020 PCP: Lucianne Lei, MD   Brief Narrative:  Sandy Sutton is a 65 y.o. female with medical history significant of arthritis, GERD, gout presented to the ED with complaints of abdominal pain, nausea, vomiting, and diarrhea.  In the ED, tachycardic but not hypotensive.  Labs showing WBC 40.6,  UA with large amount of leukocytes and greater than 50 WBCs.  Screening COVID and influenza PCR negative. CT abdomen pelvis showing a 9 mm right UPJ stone with mild fullness of the right renal collecting system.  Patient was given ceftriaxone and 1 L fluid bolus.  Urology was consulted and patient underwent right ureteral stent placement on 10/24/2020 and then was admitted to hospital service due to sepsis secondary to complicated UTI/infected nephrolithiasis.  Assessment & Plan:   Principal Problem:   Complicated UTI (urinary tract infection) Active Problems:   Sepsis ([place])   AKI (acute kidney injury) ([place])   Sepsis secondary to complicated UTI/infected nephrolithiasis, POA: Met criteria for sepsis with tachycardia and significant leukocytosis.  Urinalysis with evidence of pyuria.  CT showing a 9 mm right UPJ stone with mild fullness of the right renal collecting system.  She underwent right ureteral stent placement by urology.  Afebrile for last 4 days.  Not much improvement in abdominal pain and leukocytosis.  Continue current antibiotics, follow culture.  Gram-negative bacteremia: Growing E. coli.  Sensitivities still pending.  Continue Rocephin.  AKI/obstructive uropathy: Some worsening of creatinine today.  Repeat tomorrow.  Nausea/vomiting: No vomiting but nausea.  Continue as needed Zofran.  GERD: Continue PPI and Tums.  Chronic anemia: Hemoglobin is stable.  DVT prophylaxis: SCDs Start: 10/25/20 0537   Code Status: Full Code  Family Communication: None present at bedside.  Plan of care discussed with  patient in length and he verbalized understanding and agreed with it.  Status is: Inpatient  Remains inpatient appropriate because:Inpatient level of care appropriate due to severity of illness   Dispo: The patient is from: Home              Anticipated d/c is to: Home              Patient currently is not medically stable to d/c.   Difficult to place patient No        Estimated body mass index is 30.08 kg/m as calculated from the following:   Height as of this encounter: 5' 2.5" (1.588 m).   Weight as of this encounter: 75.8 kg.      Nutritional status:               Consultants:   Urology  Procedures:   As above  Antimicrobials:  Anti-infectives (From admission, onward)   Start     Dose/Rate Route Frequency Ordered Stop   10/27/20 0958  cefTRIAXone (ROCEPHIN) 2 g in sodium chloride 0.9 % 100 mL IVPB        2 g 200 mL/hr over 30 Minutes Intravenous Every 24 hours 10/27/20 0958     10/27/20 0600  vancomycin (VANCOREADY) IVPB 1000 mg/200 mL  Status:  Discontinued        1,000 mg 200 mL/hr over 60 Minutes Intravenous Every 48 hours 10/25/20 0547 10/25/20 1034   10/25/20 2200  cefTRIAXone (ROCEPHIN) 1 g in sodium chloride 0.9 % 100 mL IVPB  Status:  Discontinued        1 g 200 mL/hr over 30 Minutes Intravenous Every 24 hours  10/25/20 0038 10/25/20 0537   10/25/20 0800  ceFEPIme (MAXIPIME) 2 g in sodium chloride 0.9 % 100 mL IVPB  Status:  Discontinued        2 g 200 mL/hr over 30 Minutes Intravenous Every 12 hours 10/25/20 0547 10/27/20 0958   10/25/20 0645  vancomycin (VANCOREADY) IVPB 1500 mg/300 mL        1,500 mg 150 mL/hr over 120 Minutes Intravenous  Once 10/25/20 0547 10/25/20 0842   10/25/20 0045  cefTRIAXone (ROCEPHIN) 2 g in sodium chloride 0.9 % 100 mL IVPB  Status:  Discontinued        2 g 200 mL/hr over 30 Minutes Intravenous Every 24 hours 10/25/20 0037 10/25/20 0038   10/24/20 2215  cefTRIAXone (ROCEPHIN) 2 g in sodium chloride 0.9 % 100  mL IVPB        2 g 200 mL/hr over 30 Minutes Intravenous  Once 10/24/20 2205 10/24/20 2340         Subjective: Seen and examined.  Still complains of right lower quadrant pain and right flank pain.  No other complaint.  Objective: Vitals:   10/27/20 1313 10/27/20 1743 10/28/20 0047 10/28/20 0449  BP: (!) 107/53 (!) 108/58 (!) 111/55 (!) 102/56  Pulse: 86 79 89 68  Resp: 19 17 18 18   Temp: 98 F (36.7 C) 98.1 F (36.7 C) 98.4 F (36.9 C) 97.7 F (36.5 C)  TempSrc: Oral Oral Oral Oral  SpO2: 98% 100% 98% 100%  Weight:      Height:        Intake/Output Summary (Last 24 hours) at 10/28/2020 1158 Last data filed at 10/28/2020 0300 Gross per 24 hour  Intake 470.33 ml  Output --  Net 470.33 ml   Filed Weights   10/24/20 2217 10/25/20 0157  Weight: 73.9 kg 75.8 kg    Examination:  General exam: Appears calm and comfortable  Respiratory system: Clear to auscultation. Respiratory effort normal. Cardiovascular system: S1 & S2 heard, RRR. No JVD, murmurs, rubs, gallops or clicks. No pedal edema. Gastrointestinal system: Abdomen is nondistended, soft and right lower quadrant right CVA tenderness. No organomegaly or masses felt. Normal bowel sounds heard. Central nervous system: Alert and oriented. No focal neurological deficits. Extremities: Symmetric 5 x 5 power. Skin: No rashes, lesions or ulcers.  Psychiatry: Judgement and insight appear normal. Mood & affect appropriate.   Data Reviewed: I have personally reviewed following labs and imaging studies  CBC: Recent Labs  Lab 10/24/20 1543 10/25/20 0549 10/26/20 0509 10/27/20 0319 10/28/20 0356  WBC 40.6* 28.7* 24.1* 16.3* 15.6*  NEUTROABS 37.4*  --  21.7* 12.7* 11.1*  HGB 10.6* 9.4* 9.2* 9.0* 9.7*  HCT 33.5* 29.0* 28.6* 28.0* 29.4*  MCV 92.3 90.1 91.1 91.5 88.8  PLT 363 320 362 350 300*   Basic Metabolic Panel: Recent Labs  Lab 10/24/20 1543 10/25/20 0549 10/26/20 0509 10/27/20 0319 10/28/20 0356  NA 132*  135 135 136 135  K 4.0 4.4 4.2 3.8 4.0  CL 98 102 105 107 104  CO2 25 23 22  19* 25  GLUCOSE 102* 143* 108* 97 98  BUN 27* 25* 24* 19 12  CREATININE 1.55* 1.31* 1.21* 1.04* 1.19*  CALCIUM 8.9 9.0 8.8* 8.8* 9.0   GFR: Estimated Creatinine Clearance: 44.9 mL/min (A) (by C-G formula based on SCr of 1.19 mg/dL (H)). Liver Function Tests: Recent Labs  Lab 10/24/20 1543  AST 29  ALT 28  ALKPHOS 116  BILITOT 1.3*  PROT 7.9  ALBUMIN  2.8*   Recent Labs  Lab 10/24/20 1543  LIPASE 24   No results for input(s): AMMONIA in the last 168 hours. Coagulation Profile: No results for input(s): INR, PROTIME in the last 168 hours. Cardiac Enzymes: No results for input(s): CKTOTAL, CKMB, CKMBINDEX, TROPONINI in the last 168 hours. BNP (last 3 results) No results for input(s): PROBNP in the last 8760 hours. HbA1C: No results for input(s): HGBA1C in the last 72 hours. CBG: No results for input(s): GLUCAP in the last 168 hours. Lipid Profile: No results for input(s): CHOL, HDL, LDLCALC, TRIG, CHOLHDL, LDLDIRECT in the last 72 hours. Thyroid Function Tests: No results for input(s): TSH, T4TOTAL, FREET4, T3FREE, THYROIDAB in the last 72 hours. Anemia Panel: No results for input(s): VITAMINB12, FOLATE, FERRITIN, TIBC, IRON, RETICCTPCT in the last 72 hours. Sepsis Labs: Recent Labs  Lab 10/24/20 2320  LATICACIDVEN 1.0    Recent Results (from the past 240 hour(s))  Resp Panel by RT-PCR (Flu A&B, Covid) Nasopharyngeal Swab     Status: None   Collection Time: 10/24/20 10:20 PM   Specimen: Nasopharyngeal Swab; Nasopharyngeal(NP) swabs in vial transport medium  Result Value Ref Range Status   SARS Coronavirus 2 by RT PCR NEGATIVE NEGATIVE Final    Comment: (NOTE) SARS-CoV-2 target nucleic acids are NOT DETECTED.  The SARS-CoV-2 RNA is generally detectable in upper respiratory specimens during the acute phase of infection. The lowest concentration of SARS-CoV-2 viral copies this assay can  detect is 138 copies/mL. A negative result does not preclude SARS-Cov-2 infection and should not be used as the sole basis for treatment or other patient management decisions. A negative result may occur with  improper specimen collection/handling, submission of specimen other than nasopharyngeal swab, presence of viral mutation(s) within the areas targeted by this assay, and inadequate number of viral copies(<138 copies/mL). A negative result must be combined with clinical observations, patient history, and epidemiological information. The expected result is Negative.  Fact Sheet for Patients:  EntrepreneurPulse.com.au  Fact Sheet for Healthcare Providers:  IncredibleEmployment.be  This test is no t yet approved or cleared by the [place] FDA and  has been authorized for detection and/or diagnosis of SARS-CoV-2 by FDA under an Emergency Use Authorization (EUA). This EUA will remain  in effect (meaning this test can be used) for the duration of the COVID-19 declaration under Section 564(b)(1) of the Act, 21 U.S.C.section 360bbb-3(b)(1), unless the authorization is terminated  or revoked sooner.       Influenza A by PCR NEGATIVE NEGATIVE Final   Influenza B by PCR NEGATIVE NEGATIVE Final    Comment: (NOTE) The Xpert Xpress SARS-CoV-2/FLU/RSV plus assay is intended as an aid in the diagnosis of influenza from Nasopharyngeal swab specimens and should not be used as a sole basis for treatment. Nasal washings and aspirates are unacceptable for Xpert Xpress SARS-CoV-2/FLU/RSV testing.  Fact Sheet for Patients: EntrepreneurPulse.com.au  Fact Sheet for Healthcare Providers: IncredibleEmployment.be  This test is not yet approved or cleared by the [place] FDA and has been authorized for detection and/or diagnosis of SARS-CoV-2 by FDA under an Emergency Use Authorization (EUA). This EUA will remain in  effect (meaning this test can be used) for the duration of the COVID-19 declaration under Section 564(b)(1) of the Act, 21 U.S.C. section 360bbb-3(b)(1), unless the authorization is terminated or revoked.  Performed at [facility], [place] [address], [place], [place] [address]   Blood culture (routine x 2)     Status: Abnormal (Preliminary result)  Collection Time: 10/24/20 10:57 PM   Specimen: BLOOD  Result Value Ref Range Status   Specimen Description BLOOD LEFT ANTECUBITAL  Final   Special Requests   Final    BOTTLES DRAWN AEROBIC AND ANAEROBIC Blood Culture results may not be optimal due to an inadequate volume of blood received in culture bottles   Culture  Setup Time   Final    GRAM NEGATIVE RODS AEROBIC BOTTLE ONLY CRITICAL RESULT CALLED TO, READ BACK BY AND VERIFIED WITH: J,MILLEN PHARMD @0914  10/27/20 EB    Culture (A)  Final    ESCHERICHIA COLI SUSCEPTIBILITIES TO FOLLOW Performed at [facility], [address], [place], [place] [address]    Report Status PENDING  Incomplete  Blood Culture ID Panel (Reflexed)     Status: Abnormal   Collection Time: 10/24/20 10:57 PM  Result Value Ref Range Status   Enterococcus faecalis NOT DETECTED NOT DETECTED Final   Enterococcus Faecium NOT DETECTED NOT DETECTED Final   Listeria monocytogenes NOT DETECTED NOT DETECTED Final   Staphylococcus species NOT DETECTED NOT DETECTED Final   Staphylococcus aureus (BCID) NOT DETECTED NOT DETECTED Final   Staphylococcus epidermidis NOT DETECTED NOT DETECTED Final   Staphylococcus lugdunensis NOT DETECTED NOT DETECTED Final   Streptococcus species NOT DETECTED NOT DETECTED Final   Streptococcus agalactiae NOT DETECTED NOT DETECTED Final   Streptococcus pneumoniae NOT DETECTED NOT DETECTED Final   Streptococcus pyogenes NOT DETECTED NOT DETECTED Final   A.calcoaceticus-baumannii NOT DETECTED NOT DETECTED Final   Bacteroides fragilis NOT DETECTED NOT DETECTED Final    Enterobacterales DETECTED (A) NOT DETECTED Final    Comment: Enterobacterales represent a large order of gram negative bacteria, not a single organism. CRITICAL RESULT CALLED TO, READ BACK BY AND VERIFIED WITH: J,MILLEN PHARMD @0914  10/27/20 EB    Enterobacter cloacae complex NOT DETECTED NOT DETECTED Final   Escherichia coli DETECTED (A) NOT DETECTED Final    Comment: CRITICAL RESULT CALLED TO, READ BACK BY AND VERIFIED WITH: J,MILLEN PHARMD @0914  10/27/20 EB    Klebsiella aerogenes NOT DETECTED NOT DETECTED Final   Klebsiella oxytoca NOT DETECTED NOT DETECTED Final   Klebsiella pneumoniae NOT DETECTED NOT DETECTED Final   Proteus species NOT DETECTED NOT DETECTED Final   Salmonella species NOT DETECTED NOT DETECTED Final   Serratia marcescens NOT DETECTED NOT DETECTED Final   Haemophilus influenzae NOT DETECTED NOT DETECTED Final   Neisseria meningitidis NOT DETECTED NOT DETECTED Final   Pseudomonas aeruginosa NOT DETECTED NOT DETECTED Final   Stenotrophomonas maltophilia NOT DETECTED NOT DETECTED Final   Candida albicans NOT DETECTED NOT DETECTED Final   Candida auris NOT DETECTED NOT DETECTED Final   Candida glabrata NOT DETECTED NOT DETECTED Final   Candida krusei NOT DETECTED NOT DETECTED Final   Candida parapsilosis NOT DETECTED NOT DETECTED Final   Candida tropicalis NOT DETECTED NOT DETECTED Final   Cryptococcus neoformans/gattii NOT DETECTED NOT DETECTED Final   CTX-M ESBL NOT DETECTED NOT DETECTED Final   Carbapenem resistance IMP NOT DETECTED NOT DETECTED Final   Carbapenem resistance KPC NOT DETECTED NOT DETECTED Final   Carbapenem resistance NDM NOT DETECTED NOT DETECTED Final   Carbapenem resist OXA 48 LIKE NOT DETECTED NOT DETECTED Final   Carbapenem resistance VIM NOT DETECTED NOT DETECTED Final    Comment: Performed at [facility], [address], [place], [place] [address]  Blood culture (routine x 2)     Status: None (Preliminary result)   Collection Time:  10/24/20  11:06 PM   Specimen: BLOOD  Result Value Ref Range Status   Specimen Description BLOOD RIGHT ANTECUBITAL  Final   Special Requests   Final    BOTTLES DRAWN AEROBIC AND ANAEROBIC Blood Culture results may not be optimal due to an inadequate volume of blood received in culture bottles   Culture   Final    NO GROWTH 2 DAYS Performed at [facility] [address] [place], [place] [address]    Report Status PENDING  Incomplete  Urine culture     Status: None   Collection Time: 10/24/20 11:38 PM   Specimen: Urine, Random  Result Value Ref Range Status   Specimen Description URINE, RANDOM  Final   Special Requests NONE  Final   Culture   Final    NO GROWTH Performed at [facility], [place] [address] [place], [place] [address]    Report Status 10/27/2020 FINAL  Final      Radiology Studies: No results found.  Scheduled Meds: . calcium carbonate  400 mg of elemental calcium Oral BID  . pantoprazole  40 mg Oral Daily   Continuous Infusions: . cefTRIAXone (ROCEPHIN)  IV 2 g (10/28/20 0851)     LOS: 4 days   Time spent: 28 minutes   Darliss Cheney, MD [facility]  10/28/2020, 11:58 AM   How to contact the [facility] Attending or Consulting provider [place] or covering provider during after hours [place], for this patient?  1. Check the care team in [facility] and look for a) attending/consulting [facility] provider listed and b) the [facility] team listed. Page or secure chat [phone]. 2. Log into [email or web address] and use [facility]'s universal password to access. If you do not have the password, please contact the hospital operator. 3. Locate the [facility] provider you are looking for under [facility] and page to a number that you can be directly reached. 4. If you still have difficulty reaching the provider, please page the [facility] (Director on Call) for the Hospitalists listed on amion for assistance.

## 2020-10-28 NOTE — Care Management Important Message (Signed)
Important Message  Patient Details  Name: Sandy Sutton MRN: [id number] Date of Birth: 12-06-1955   Medicare Important Message Given:  Yes     [place] 10/28/2020, 2:05 PM

## 2020-10-29 DIAGNOSIS — N390 Urinary tract infection, site not specified: Secondary | ICD-10-CM | POA: Diagnosis not present

## 2020-10-29 DIAGNOSIS — N201 Calculus of ureter: Secondary | ICD-10-CM | POA: Diagnosis present

## 2020-10-29 LAB — CBC WITH DIFFERENTIAL/PLATELET
Abs Immature Granulocytes: 0.82 10*3/uL — ABNORMAL HIGH (ref 0.00–0.07)
Basophils Absolute: 0.1 10*3/uL (ref 0.0–0.1)
Basophils Relative: 1 %
Eosinophils Absolute: 0.2 10*3/uL (ref 0.0–0.5)
Eosinophils Relative: 1 %
HCT: 28 % — ABNORMAL LOW (ref 36.0–46.0)
Hemoglobin: 9.1 g/dL — ABNORMAL LOW (ref 12.0–15.0)
Immature Granulocytes: 6 %
Lymphocytes Relative: 24 %
Lymphs Abs: 3.5 10*3/uL (ref 0.7–4.0)
MCH: 29.4 pg (ref 26.0–34.0)
MCHC: 32.5 g/dL (ref 30.0–36.0)
MCV: 90.6 fL (ref 80.0–100.0)
Monocytes Absolute: 0.8 10*3/uL (ref 0.1–1.0)
Monocytes Relative: 5 %
Neutro Abs: 9.2 10*3/uL — ABNORMAL HIGH (ref 1.7–7.7)
Neutrophils Relative %: 63 %
Platelets: 482 10*3/uL — ABNORMAL HIGH (ref 150–400)
RBC: 3.09 MIL/uL — ABNORMAL LOW (ref 3.87–5.11)
RDW: 13.4 % (ref 11.5–15.5)
WBC: 14.5 10*3/uL — ABNORMAL HIGH (ref 4.0–10.5)
nRBC: 0 % (ref 0.0–0.2)

## 2020-10-29 LAB — CULTURE, BLOOD (ROUTINE X 2)

## 2020-10-29 MED ORDER — AMOXICILLIN 500 MG PO CAPS: 500.0000 mg | ORAL_CAPSULE | Freq: Three times a day (TID) | ORAL | 0 refills | Status: AC

## 2020-10-29 MED ORDER — AMOXICILLIN 500 MG PO CAPS
500.0000 mg | ORAL_CAPSULE | Freq: Three times a day (TID) | ORAL | Status: DC
  Administered 2020-10-29: 500 mg via ORAL
  Filled 2020-10-29 (×2): qty 1

## 2020-10-29 NOTE — Progress Notes (Signed)
Patient has ordered for discharge. Given discharge instructions to the patient. Iv removed. Given all belongings to the patient. Patient is waiting for ride.

## 2020-10-29 NOTE — Discharge Summary (Signed)
Physician Discharge Summary  Sandy Sutton [id number] DOB: 1955/08/16 DOA: 10/24/2020  PCP: Lucianne Lei, MD  Admit date: 10/24/2020 Discharge date: 10/29/2020 30 Day Unplanned Readmission Risk Score   Flowsheet Row ED to Hosp-Admission (Current) from 10/24/2020 in [facility]  30 Day Unplanned Readmission Risk Score (%) 9.58 Filed at 10/29/2020 0801     This score is the patient's risk of an unplanned readmission within 30 days of being discharged (0 -100%). The score is based on dignosis, age, lab data, medications, orders, and past utilization.   Low:  0-14.9   Medium: 15-21.9   High: 22-29.9   Extreme: 30 and above         Admitted From: Home Disposition: Home  Recommendations for Outpatient Follow-up:  1. Follow up with PCP in 1-2 weeks 2. Follow-up with urology/Dr. Abner Greenspan as a scheduled 3. Please obtain BMP/CBC in one week 4. Please follow up with your PCP on the following pending results: Unresulted Labs (From admission, onward)          Start     Ordered   10/26/20 0500  CBC with Differential/Platelet  Daily,   R     Question:  Specimen collection method  Answer:  Lab=Lab collect   10/25/20 0749            Home Health: None Equipment/Devices: None  Discharge Condition: Stable CODE STATUS: Full code Diet recommendation: Cardiac  Subjective: Seen and examined.  No abdominal pain.  Feels well.  Ready to go home.  Brief/Interim Summary: [name] a 65 y.o.femalewith medical history significant ofarthritis, GERD, gout presented to the ED with complaints of abdominal pain, nausea, vomiting, and diarrhea. In the ED, tachycardic but not hypotensive. Labs showing WBC 40.6, UA with large amount of leukocytes and greater than 50 WBCs. Screening COVID and influenza PCR negative.CT abdomen pelvis showing a 9 mm right UPJ stone with mild fullness of the right renal collecting system. Patient was given ceftriaxone and 1 L fluid  bolus. Urology was consulted and patient underwent right ureteral stent placement on 10/24/2020 and then was admitted to hospital service due to sepsis secondary to complicated UTI/infected nephrolithiasis.  Continued on Rocephin.  She clinically improved and was afebrile 24 hours after admission, however on day 3, her blood culture started to turn positive with gram-negative rods so she was kept in the hospital and Rocephin was increased to 2 g IV after discussion with the ID.  Patient has continued to improve.  She was seen by PT OT who determined that patient does not need any further PT as she is back to her baseline.  We received final culture report which shows E. coli which is pansensitive.  We switched her to amoxicillin today.  She is going to be discharged home on 3 more days of oral amoxicillin.  She received 5 days of IV Rocephin here.  Her AKI/obstructive uropathy also improved.  Discharge Diagnoses:  Principal Problem:   Complicated UTI (urinary tract infection) Active Problems:   Sepsis ([place])   AKI (acute kidney injury) ([place])   Obstruction of right ureteropelvic junction due to stone    Discharge Instructions   Allergies as of 10/29/2020      Reactions   Darvon Other (See Comments)   Hallucinations   Propoxyphene Other (See Comments)   Hallucinations   Erythromycin Nausea And Vomiting, Rash   Hydrocodone Nausea And Vomiting      Medication List    STOP taking these medications  aspirin 325 MG EC tablet     TAKE these medications   acetaminophen 650 MG CR tablet Commonly known as: TYLENOL Take 1,300 mg by mouth every 8 (eight) hours as needed for pain.   amoxicillin 500 MG capsule Commonly known as: AMOXIL Take 1 capsule (500 mg total) by mouth every 8 (eight) hours for 3 days.   B-12 PO Take 1 tablet by mouth daily.   D3 PO Take 1 capsule by mouth daily.   fluticasone 50 MCG/ACT nasal spray Commonly known as: FLONASE Place 1-2 sprays into both nostrils  daily as needed for allergies or rhinitis.   methocarbamol 500 MG tablet Commonly known as: ROBAXIN Take 1 tablet (500 mg total) by mouth every 8 (eight) hours as needed for muscle spasms. DO NOT DRINK ALCOHOL OR DRIVE WHILE TAKING THIS MEDICINE   multivitamin with minerals Tabs tablet Take 1 tablet by mouth daily.   pantoprazole 40 MG tablet Commonly known as: PROTONIX Take 40 mg by mouth daily.   promethazine 25 MG tablet Commonly known as: PHENERGAN Take 25 mg by mouth 2 (two) times daily as needed for nausea/vomiting.   Salonpas 3.06-20-08 % Ptch Generic drug: Camphor-Menthol-Methyl Sal Apply 1 patch topically as needed (pain).       Follow-up Information    Schedule an appointment as soon as possible for a visit with [place].   Why: 2 weeks Contact information: [place]       Lucianne Lei, MD Follow up in 1 week(s).   Specialty: Family Medicine Contact information: [place] [address] [place]  [address] [phone]              Allergies  Allergen Reactions  . Darvon Other (See Comments)    Hallucinations  . Propoxyphene Other (See Comments)    Hallucinations  . Erythromycin Nausea And Vomiting and Rash  . Hydrocodone Nausea And Vomiting    Consultations: Urology   Procedures/Studies: CT ABDOMEN PELVIS WO CONTRAST  Result Date: 10/24/2020 CLINICAL DATA:  65 year old female with abdominal pain and fever. EXAM: CT ABDOMEN AND PELVIS WITHOUT CONTRAST TECHNIQUE: Multidetector CT imaging of the abdomen and pelvis was performed following the standard protocol without IV contrast. COMPARISON:  CT abdomen pelvis dated 09/08/2010. FINDINGS: Evaluation of this exam is limited in the absence of intravenous contrast. Lower chest: Minimal bibasilar linear atelectasis/scarring. The visualized lung bases are otherwise clear. No intra-abdominal free air or free fluid. Hepatobiliary: Small  calcific focus over the left lobe of the liver. The liver is otherwise unremarkable. No intrahepatic biliary dilatation. The gallbladder is unremarkable. Pancreas: Unremarkable. No pancreatic ductal dilatation or surrounding inflammatory changes. Spleen: Normal in size without focal abnormality. Adrenals/Urinary Tract: The adrenal glands unremarkable. There is a 9 mm stone in the right renal pelvis at the ureteropelvic junction. There is mild fullness of the right renal collecting system with mild right perinephric stranding. The left kidney, left ureter, and urinary bladder appear unremarkable. Stomach/Bowel: There is no bowel obstruction or active inflammation. The appendix is normal. Vascular/Lymphatic: Mild atherosclerotic calcification of the abdominal aorta. The IVC is unremarkable. No portal venous gas. There is no adenopathy. Reproductive: The uterus is anteverted and grossly unremarkable. No adnexal masses Other: None Musculoskeletal: No acute or significant osseous findings. IMPRESSION: 1. A 9 mm right UPJ stone with mild fullness of the right renal collecting system. 2. No bowel obstruction. Normal appendix. 3. Aortic Atherosclerosis (ICD10-I70.0). Electronically Signed  By: Anner Crete M.D.   On: 10/24/2020 20:51   DG Retrograde Pyelogram  Result Date: 10/25/2020 CLINICAL DATA:  65 year old female with retrograde pyelogram and right sided stent. EXAM: RETROGRADE PYELOGRAM COMPARISON:  None. FINDINGS: Four intraoperative fluoroscopic images provided. The total fluoroscopic time is 26 seconds. A right-sided ureteral stent is noted. Contrast injected through the stent opacifies the renal collecting system. IMPRESSION: Right ureteral stent placement. Electronically Signed   By: Anner Crete M.D.   On: 10/25/2020 01:11      Discharge Exam: Vitals:   10/28/20 2249 10/29/20 0523  BP: (!) 108/58 (!) 102/55  Pulse: 92 85  Resp: 17 18  Temp: 98.8 F (37.1 C) 98.7 F (37.1 C)  SpO2: 98%  100%   Vitals:   10/28/20 1200 10/28/20 1754 10/28/20 2249 10/29/20 0523  BP: (!) 120/54 (!) 97/48 (!) 108/58 (!) 102/55  Pulse: 89 98 92 85  Resp: 16 16 17 18   Temp: 98.7 F (37.1 C) 98.6 F (37 C) 98.8 F (37.1 C) 98.7 F (37.1 C)  TempSrc: Oral Oral Oral Oral  SpO2: 99% 99% 98% 100%  Weight:      Height:        General: Pt is alert, awake, not in acute distress Cardiovascular: RRR, S1/S2 +, no rubs, no gallops Respiratory: CTA bilaterally, no wheezing, no rhonchi Abdominal: Soft, mild to moderate right CVA tenderness, ND, bowel sounds + Extremities: no edema, no cyanosis    The results of significant diagnostics from this hospitalization (including imaging, microbiology, ancillary and laboratory) are listed below for reference.     Microbiology: Recent Results (from the past 240 hour(s))  Resp Panel by RT-PCR (Flu A&B, Covid) Nasopharyngeal Swab     Status: None   Collection Time: 10/24/20 10:20 PM   Specimen: Nasopharyngeal Swab; Nasopharyngeal(NP) swabs in vial transport medium  Result Value Ref Range Status   SARS Coronavirus 2 by RT PCR NEGATIVE NEGATIVE Final    Comment: (NOTE) SARS-CoV-2 target nucleic acids are NOT DETECTED.  The SARS-CoV-2 RNA is generally detectable in upper respiratory specimens during the acute phase of infection. The lowest concentration of SARS-CoV-2 viral copies this assay can detect is 138 copies/mL. A negative result does not preclude SARS-Cov-2 infection and should not be used as the sole basis for treatment or other patient management decisions. A negative result may occur with  improper specimen collection/handling, submission of specimen other than nasopharyngeal swab, presence of viral mutation(s) within the areas targeted by this assay, and inadequate number of viral copies(<138 copies/mL). A negative result must be combined with clinical observations, patient history, and epidemiological information. The expected result is  Negative.  Fact Sheet for Patients:  EntrepreneurPulse.com.au  Fact Sheet for Healthcare Providers:  IncredibleEmployment.be  This test is no t yet approved or cleared by the [place] FDA and  has been authorized for detection and/or diagnosis of SARS-CoV-2 by FDA under an Emergency Use Authorization (EUA). This EUA will remain  in effect (meaning this test can be used) for the duration of the COVID-19 declaration under Section 564(b)(1) of the Act, 21 U.S.C.section 360bbb-3(b)(1), unless the authorization is terminated  or revoked sooner.       Influenza A by PCR NEGATIVE NEGATIVE Final   Influenza B by PCR NEGATIVE NEGATIVE Final    Comment: (NOTE) The Xpert Xpress SARS-CoV-2/FLU/RSV plus assay is intended as an aid in the diagnosis of influenza from Nasopharyngeal swab specimens and should not be used as a sole basis for  treatment. Nasal washings and aspirates are unacceptable for Xpert Xpress SARS-CoV-2/FLU/RSV testing.  Fact Sheet for Patients: EntrepreneurPulse.com.au  Fact Sheet for Healthcare Providers: IncredibleEmployment.be  This test is not yet approved or cleared by the [place] FDA and has been authorized for detection and/or diagnosis of SARS-CoV-2 by FDA under an Emergency Use Authorization (EUA). This EUA will remain in effect (meaning this test can be used) for the duration of the COVID-19 declaration under Section 564(b)(1) of the Act, 21 U.S.C. section 360bbb-3(b)(1), unless the authorization is terminated or revoked.  Performed at [facility], [place] [address], [place], [place] [address]   Blood culture (routine x 2)     Status: Abnormal (Preliminary result)   Collection Time: 10/24/20 10:57 PM   Specimen: BLOOD  Result Value Ref Range Status   Specimen Description BLOOD LEFT ANTECUBITAL  Final   Special Requests   Final    BOTTLES DRAWN AEROBIC AND ANAEROBIC  Blood Culture results may not be optimal due to an inadequate volume of blood received in culture bottles   Culture  Setup Time   Final    GRAM NEGATIVE RODS IN BOTH AEROBIC AND ANAEROBIC BOTTLES CRITICAL RESULT CALLED TO, READ BACK BY AND VERIFIED WITH: J,MILLEN PHARMD @0914  10/27/20 EB Performed at [facility], [place] [address], [place], [place] [address]    Culture ESCHERICHIA COLI (A)  Final   Report Status PENDING  Incomplete   Organism ID, Bacteria ESCHERICHIA COLI  Final      Susceptibility   Escherichia coli - MIC*    AMPICILLIN <=2 SENSITIVE Sensitive     CEFAZOLIN <=4 SENSITIVE Sensitive     CEFEPIME <=0.12 SENSITIVE Sensitive     CEFTAZIDIME <=1 SENSITIVE Sensitive     CEFTRIAXONE <=0.25 SENSITIVE Sensitive     CIPROFLOXACIN <=0.25 SENSITIVE Sensitive     GENTAMICIN <=1 SENSITIVE Sensitive     IMIPENEM <=0.25 SENSITIVE Sensitive     TRIMETH/SULFA <=20 SENSITIVE Sensitive     AMPICILLIN/SULBACTAM <=2 SENSITIVE Sensitive     PIP/TAZO <=4 SENSITIVE Sensitive     * ESCHERICHIA COLI  Blood Culture ID Panel (Reflexed)     Status: Abnormal   Collection Time: 10/24/20 10:57 PM  Result Value Ref Range Status   Enterococcus faecalis NOT DETECTED NOT DETECTED Final   Enterococcus Faecium NOT DETECTED NOT DETECTED Final   Listeria monocytogenes NOT DETECTED NOT DETECTED Final   Staphylococcus species NOT DETECTED NOT DETECTED Final   Staphylococcus aureus (BCID) NOT DETECTED NOT DETECTED Final   Staphylococcus epidermidis NOT DETECTED NOT DETECTED Final   Staphylococcus lugdunensis NOT DETECTED NOT DETECTED Final   Streptococcus species NOT DETECTED NOT DETECTED Final   Streptococcus agalactiae NOT DETECTED NOT DETECTED Final   Streptococcus pneumoniae NOT DETECTED NOT DETECTED Final   Streptococcus pyogenes NOT DETECTED NOT DETECTED Final   A.calcoaceticus-baumannii NOT DETECTED NOT DETECTED Final   Bacteroides fragilis NOT DETECTED NOT DETECTED Final   Enterobacterales  DETECTED (A) NOT DETECTED Final    Comment: Enterobacterales represent a large order of gram negative bacteria, not a single organism. CRITICAL RESULT CALLED TO, READ BACK BY AND VERIFIED WITH: J,MILLEN PHARMD @0914  10/27/20 EB    Enterobacter cloacae complex NOT DETECTED NOT DETECTED Final   Escherichia coli DETECTED (A) NOT DETECTED Final    Comment: CRITICAL RESULT CALLED TO, READ BACK BY AND VERIFIED WITH: J,MILLEN PHARMD @0914  10/27/20 EB    Klebsiella aerogenes NOT DETECTED NOT DETECTED Final   Klebsiella oxytoca NOT DETECTED  NOT DETECTED Final   Klebsiella pneumoniae NOT DETECTED NOT DETECTED Final   Proteus species NOT DETECTED NOT DETECTED Final   Salmonella species NOT DETECTED NOT DETECTED Final   Serratia marcescens NOT DETECTED NOT DETECTED Final   Haemophilus influenzae NOT DETECTED NOT DETECTED Final   Neisseria meningitidis NOT DETECTED NOT DETECTED Final   Pseudomonas aeruginosa NOT DETECTED NOT DETECTED Final   Stenotrophomonas maltophilia NOT DETECTED NOT DETECTED Final   Candida albicans NOT DETECTED NOT DETECTED Final   Candida auris NOT DETECTED NOT DETECTED Final   Candida glabrata NOT DETECTED NOT DETECTED Final   Candida krusei NOT DETECTED NOT DETECTED Final   Candida parapsilosis NOT DETECTED NOT DETECTED Final   Candida tropicalis NOT DETECTED NOT DETECTED Final   Cryptococcus neoformans/gattii NOT DETECTED NOT DETECTED Final   CTX-M ESBL NOT DETECTED NOT DETECTED Final   Carbapenem resistance IMP NOT DETECTED NOT DETECTED Final   Carbapenem resistance KPC NOT DETECTED NOT DETECTED Final   Carbapenem resistance NDM NOT DETECTED NOT DETECTED Final   Carbapenem resist OXA 48 LIKE NOT DETECTED NOT DETECTED Final   Carbapenem resistance VIM NOT DETECTED NOT DETECTED Final    Comment: Performed at [facility], [address], [place], [place] [address]  Blood culture (routine x 2)     Status: None (Preliminary result)   Collection Time: 10/24/20 11:06 PM    Specimen: BLOOD  Result Value Ref Range Status   Specimen Description BLOOD RIGHT ANTECUBITAL  Final   Special Requests   Final    BOTTLES DRAWN AEROBIC AND ANAEROBIC Blood Culture results may not be optimal due to an inadequate volume of blood received in culture bottles   Culture   Final    NO GROWTH 4 DAYS Performed at [facility], [place] [address], [place], [place] [address]    Report Status PENDING  Incomplete  Urine culture     Status: None   Collection Time: 10/24/20 11:38 PM   Specimen: Urine, Random  Result Value Ref Range Status   Specimen Description URINE, RANDOM  Final   Special Requests NONE  Final   Culture   Final    NO GROWTH Performed at [facility], [place] [address], [place], [place] [address]    Report Status 10/27/2020 FINAL  Final     Labs: BNP (last 3 results) No results for input(s): BNP in the last 8760 hours. Basic Metabolic Panel: Recent Labs  Lab 10/24/20 1543 10/25/20 0549 10/26/20 0509 10/27/20 0319 10/28/20 0356  NA 132* 135 135 136 135  K 4.0 4.4 4.2 3.8 4.0  CL 98 102 105 107 104  CO2 25 23 22  19* 25  GLUCOSE 102* 143* 108* 97 98  BUN 27* 25* 24* 19 12  CREATININE 1.55* 1.31* 1.21* 1.04* 1.19*  CALCIUM 8.9 9.0 8.8* 8.8* 9.0   Liver Function Tests: Recent Labs  Lab 10/24/20 1543  AST 29  ALT 28  ALKPHOS 116  BILITOT 1.3*  PROT 7.9  ALBUMIN 2.8*   Recent Labs  Lab 10/24/20 1543  LIPASE 24   No results for input(s): AMMONIA in the last 168 hours. CBC: Recent Labs  Lab 10/24/20 1543 10/25/20 0549 10/26/20 0509 10/27/20 0319 10/28/20 0356 10/29/20 0157  WBC 40.6* 28.7* 24.1* 16.3* 15.6* 14.5*  NEUTROABS 37.4*  --  21.7* 12.7* 11.1* 9.2*  HGB 10.6* 9.4* 9.2* 9.0* 9.7* 9.1*  HCT 33.5* 29.0* 28.6* 28.0* 29.4* 28.0*  MCV 92.3 90.1 91.1 91.5  88.8 90.6  PLT 363 320 362 350 430* 482*   Cardiac Enzymes: No results for input(s): CKTOTAL, CKMB, CKMBINDEX, TROPONINI in the last 168 hours. BNP: Invalid  input(s): POCBNP CBG: No results for input(s): GLUCAP in the last 168 hours. D-Dimer No results for input(s): DDIMER in the last 72 hours. Hgb A1c No results for input(s): HGBA1C in the last 72 hours. Lipid Profile No results for input(s): CHOL, HDL, LDLCALC, TRIG, CHOLHDL, LDLDIRECT in the last 72 hours. Thyroid function studies No results for input(s): TSH, T4TOTAL, T3FREE, THYROIDAB in the last 72 hours.  Invalid input(s): FREET3 Anemia work up No results for input(s): VITAMINB12, FOLATE, FERRITIN, TIBC, IRON, RETICCTPCT in the last 72 hours. Urinalysis    Component Value Date/Time   COLORURINE AMBER (A) 10/24/2020 1728   APPEARANCEUR CLOUDY (A) 10/24/2020 1728   LABSPEC 1.020 10/24/2020 1728   PHURINE 5.0 10/24/2020 1728   GLUCOSEU NEGATIVE 10/24/2020 1728   HGBUR MODERATE (A) 10/24/2020 1728   BILIRUBINUR NEGATIVE 10/24/2020 1728   KETONESUR 20 (A) 10/24/2020 1728   PROTEINUR 100 (A) 10/24/2020 1728   UROBILINOGEN 1.0 03/06/2015 1249   NITRITE NEGATIVE 10/24/2020 1728   LEUKOCYTESUR LARGE (A) 10/24/2020 1728   Sepsis Labs Invalid input(s): PROCALCITONIN,  WBC,  LACTICIDVEN Microbiology Recent Results (from the past 240 hour(s))  Resp Panel by RT-PCR (Flu A&B, Covid) Nasopharyngeal Swab     Status: None   Collection Time: 10/24/20 10:20 PM   Specimen: Nasopharyngeal Swab; Nasopharyngeal(NP) swabs in vial transport medium  Result Value Ref Range Status   SARS Coronavirus 2 by RT PCR NEGATIVE NEGATIVE Final    Comment: (NOTE) SARS-CoV-2 target nucleic acids are NOT DETECTED.  The SARS-CoV-2 RNA is generally detectable in upper respiratory specimens during the acute phase of infection. The lowest concentration of SARS-CoV-2 viral copies this assay can detect is 138 copies/mL. A negative result does not preclude SARS-Cov-2 infection and should not be used as the sole basis for treatment or other patient management decisions. A negative result may occur with  improper  specimen collection/handling, submission of specimen other than nasopharyngeal swab, presence of viral mutation(s) within the areas targeted by this assay, and inadequate number of viral copies(<138 copies/mL). A negative result must be combined with clinical observations, patient history, and epidemiological information. The expected result is Negative.  Fact Sheet for Patients:  EntrepreneurPulse.com.au  Fact Sheet for Healthcare Providers:  IncredibleEmployment.be  This test is no t yet approved or cleared by the [place] FDA and  has been authorized for detection and/or diagnosis of SARS-CoV-2 by FDA under an Emergency Use Authorization (EUA). This EUA will remain  in effect (meaning this test can be used) for the duration of the COVID-19 declaration under Section 564(b)(1) of the Act, 21 U.S.C.section 360bbb-3(b)(1), unless the authorization is terminated  or revoked sooner.       Influenza A by PCR NEGATIVE NEGATIVE Final   Influenza B by PCR NEGATIVE NEGATIVE Final    Comment: (NOTE) The Xpert Xpress SARS-CoV-2/FLU/RSV plus assay is intended as an aid in the diagnosis of influenza from Nasopharyngeal swab specimens and should not be used as a sole basis for treatment. Nasal washings and aspirates are unacceptable for Xpert Xpress SARS-CoV-2/FLU/RSV testing.  Fact Sheet for Patients: EntrepreneurPulse.com.au  Fact Sheet for Healthcare Providers: IncredibleEmployment.be  This test is not yet approved or cleared by the [place] FDA and has been authorized for detection and/or diagnosis of SARS-CoV-2 by FDA under an Emergency Use Authorization (EUA). This  EUA will remain in effect (meaning this test can be used) for the duration of the COVID-19 declaration under Section 564(b)(1) of the Act, 21 U.S.C. section 360bbb-3(b)(1), unless the authorization is terminated or revoked.  Performed at  [facility], [place] [address] [place], [place] [address]   Blood culture (routine x 2)     Status: Abnormal (Preliminary result)   Collection Time: 10/24/20 10:57 PM   Specimen: BLOOD  Result Value Ref Range Status   Specimen Description BLOOD LEFT ANTECUBITAL  Final   Special Requests   Final    BOTTLES DRAWN AEROBIC AND ANAEROBIC Blood Culture results may not be optimal due to an inadequate volume of blood received in culture bottles   Culture  Setup Time   Final    GRAM NEGATIVE RODS IN BOTH AEROBIC AND ANAEROBIC BOTTLES CRITICAL RESULT CALLED TO, READ BACK BY AND VERIFIED WITH: J,MILLEN PHARMD @0914  10/27/20 EB Performed at [facility], [place] [address] [place], [place] [address]    Culture ESCHERICHIA COLI (A)  Final   Report Status PENDING  Incomplete   Organism ID, Bacteria ESCHERICHIA COLI  Final      Susceptibility   Escherichia coli - MIC*    AMPICILLIN <=2 SENSITIVE Sensitive     CEFAZOLIN <=4 SENSITIVE Sensitive     CEFEPIME <=0.12 SENSITIVE Sensitive     CEFTAZIDIME <=1 SENSITIVE Sensitive     CEFTRIAXONE <=0.25 SENSITIVE Sensitive     CIPROFLOXACIN <=0.25 SENSITIVE Sensitive     GENTAMICIN <=1 SENSITIVE Sensitive     IMIPENEM <=0.25 SENSITIVE Sensitive     TRIMETH/SULFA <=20 SENSITIVE Sensitive     AMPICILLIN/SULBACTAM <=2 SENSITIVE Sensitive     PIP/TAZO <=4 SENSITIVE Sensitive     * ESCHERICHIA COLI  Blood Culture ID Panel (Reflexed)     Status: Abnormal   Collection Time: 10/24/20 10:57 PM  Result Value Ref Range Status   Enterococcus faecalis NOT DETECTED NOT DETECTED Final   Enterococcus Faecium NOT DETECTED NOT DETECTED Final   Listeria monocytogenes NOT DETECTED NOT DETECTED Final   Staphylococcus species NOT DETECTED NOT DETECTED Final   Staphylococcus aureus (BCID) NOT DETECTED NOT DETECTED Final   Staphylococcus epidermidis NOT DETECTED NOT DETECTED Final   Staphylococcus lugdunensis NOT DETECTED NOT DETECTED Final   Streptococcus species  NOT DETECTED NOT DETECTED Final   Streptococcus agalactiae NOT DETECTED NOT DETECTED Final   Streptococcus pneumoniae NOT DETECTED NOT DETECTED Final   Streptococcus pyogenes NOT DETECTED NOT DETECTED Final   A.calcoaceticus-baumannii NOT DETECTED NOT DETECTED Final   Bacteroides fragilis NOT DETECTED NOT DETECTED Final   Enterobacterales DETECTED (A) NOT DETECTED Final    Comment: Enterobacterales represent a large order of gram negative bacteria, not a single organism. CRITICAL RESULT CALLED TO, READ BACK BY AND VERIFIED WITH: J,MILLEN PHARMD @0914  10/27/20 EB    Enterobacter cloacae complex NOT DETECTED NOT DETECTED Final   Escherichia coli DETECTED (A) NOT DETECTED Final    Comment: CRITICAL RESULT CALLED TO, READ BACK BY AND VERIFIED WITH: J,MILLEN PHARMD @0914  10/27/20 EB    Klebsiella aerogenes NOT DETECTED NOT DETECTED Final   Klebsiella oxytoca NOT DETECTED NOT DETECTED Final   Klebsiella pneumoniae NOT DETECTED NOT DETECTED Final   Proteus species NOT DETECTED NOT DETECTED Final   Salmonella species NOT DETECTED NOT DETECTED Final   Serratia marcescens NOT DETECTED NOT DETECTED Final   Haemophilus influenzae NOT DETECTED NOT DETECTED Final   Neisseria meningitidis NOT DETECTED NOT DETECTED Final  Pseudomonas aeruginosa NOT DETECTED NOT DETECTED Final   Stenotrophomonas maltophilia NOT DETECTED NOT DETECTED Final   Candida albicans NOT DETECTED NOT DETECTED Final   Candida auris NOT DETECTED NOT DETECTED Final   Candida glabrata NOT DETECTED NOT DETECTED Final   Candida krusei NOT DETECTED NOT DETECTED Final   Candida parapsilosis NOT DETECTED NOT DETECTED Final   Candida tropicalis NOT DETECTED NOT DETECTED Final   Cryptococcus neoformans/gattii NOT DETECTED NOT DETECTED Final   CTX-M ESBL NOT DETECTED NOT DETECTED Final   Carbapenem resistance IMP NOT DETECTED NOT DETECTED Final   Carbapenem resistance KPC NOT DETECTED NOT DETECTED Final   Carbapenem resistance NDM NOT  DETECTED NOT DETECTED Final   Carbapenem resist OXA 48 LIKE NOT DETECTED NOT DETECTED Final   Carbapenem resistance VIM NOT DETECTED NOT DETECTED Final    Comment: Performed at [facility], [address] [place], [place] [address]  Blood culture (routine x 2)     Status: None (Preliminary result)   Collection Time: 10/24/20 11:06 PM   Specimen: BLOOD  Result Value Ref Range Status   Specimen Description BLOOD RIGHT ANTECUBITAL  Final   Special Requests   Final    BOTTLES DRAWN AEROBIC AND ANAEROBIC Blood Culture results may not be optimal due to an inadequate volume of blood received in culture bottles   Culture   Final    NO GROWTH 4 DAYS Performed at [facility], [place] [address] [place],  [address]    Report Status PENDING  Incomplete  Urine culture     Status: None   Collection Time: 10/24/20 11:38 PM   Specimen: Urine, Random  Result Value Ref Range Status   Specimen Description URINE, RANDOM  Final   Special Requests NONE  Final   Culture   Final    NO GROWTH Performed at [facility], [place] [address] [place],  [address]    Report Status 10/27/2020 FINAL  Final     Time coordinating discharge: Over 30 minutes  SIGNED:   Darliss Cheney, MD  [facility] 10/29/2020, 9:26 AM  If 7PM-7AM, please contact night-coverage [email or web address]

## 2020-10-30 LAB — CULTURE, BLOOD (ROUTINE X 2): Culture: NO GROWTH

## 2020-11-05 ENCOUNTER — Other Ambulatory Visit: Payer: Self-pay | Admitting: Urology

## 2020-11-05 DIAGNOSIS — A414 Sepsis due to anaerobes: Secondary | ICD-10-CM | POA: Diagnosis not present

## 2020-11-05 DIAGNOSIS — R6520 Severe sepsis without septic shock: Secondary | ICD-10-CM | POA: Diagnosis not present

## 2020-11-06 DIAGNOSIS — R6520 Severe sepsis without septic shock: Secondary | ICD-10-CM | POA: Diagnosis not present

## 2020-11-06 DIAGNOSIS — A414 Sepsis due to anaerobes: Secondary | ICD-10-CM | POA: Diagnosis not present

## 2020-11-06 DIAGNOSIS — N200 Calculus of kidney: Secondary | ICD-10-CM | POA: Diagnosis not present

## 2020-11-13 ENCOUNTER — Other Ambulatory Visit: Payer: Self-pay

## 2020-11-13 ENCOUNTER — Encounter (HOSPITAL_BASED_OUTPATIENT_CLINIC_OR_DEPARTMENT_OTHER): Payer: Self-pay | Admitting: Urology

## 2020-11-13 NOTE — Progress Notes (Signed)
Spoke w/ via phone for pre-op interview---pt Lab needs dos----none          Lab results------cbc with dif, bmp 10-29-2020 epic COVID test -----patient states asymptomatic no test needed Arrive at -------745 am 11-15-2020 epic NPO after MN NO Solid Food.  Clear liquids from MN until---645 am then npo Med rec completed Medications to take morning of surgery -----pantaporazole, flonase nasal spray prn Diabetic medication -----n/a Patient instructed no nail polish to be worn day of surgery Patient instructed to bring photo id and insurance card day of surgery Patient aware to have Driver (ride ) / caregiver dr Devin Going   for 24 hours after surgery  Patient Special Instructions -----none Pre-Op special Istructions -----none Patient verbalized understanding of instructions that were given at this phone interview. Patient denies shortness of breath, chest pain, fever, cough at this phone interview.

## 2020-11-14 DIAGNOSIS — N200 Calculus of kidney: Secondary | ICD-10-CM | POA: Diagnosis not present

## 2020-11-15 ENCOUNTER — Ambulatory Visit (HOSPITAL_BASED_OUTPATIENT_CLINIC_OR_DEPARTMENT_OTHER)
Admission: RE | Admit: 2020-11-15 | Discharge: 2020-11-15 | Disposition: A | Discharge status: Home / Self Care | Payer: Medicare Other | Attending: Urology | Admitting: Urology

## 2020-11-15 ENCOUNTER — Encounter (HOSPITAL_BASED_OUTPATIENT_CLINIC_OR_DEPARTMENT_OTHER): Payer: Self-pay | Admitting: Urology

## 2020-11-15 ENCOUNTER — Ambulatory Visit (HOSPITAL_COMMUNITY): Payer: Medicare Other

## 2020-11-15 ENCOUNTER — Encounter (HOSPITAL_BASED_OUTPATIENT_CLINIC_OR_DEPARTMENT_OTHER)
Admission: RE | Disposition: A | Discharge status: Home / Self Care | Payer: Self-pay | Source: Home / Self Care | Attending: Urology

## 2020-11-15 ENCOUNTER — Ambulatory Visit (HOSPITAL_BASED_OUTPATIENT_CLINIC_OR_DEPARTMENT_OTHER): Payer: Medicare Other | Admitting: Anesthesiology

## 2020-11-15 ENCOUNTER — Other Ambulatory Visit: Payer: Self-pay

## 2020-11-15 DIAGNOSIS — Z8042 Family history of malignant neoplasm of prostate: Secondary | ICD-10-CM | POA: Diagnosis not present

## 2020-11-15 DIAGNOSIS — Z8249 Family history of ischemic heart disease and other diseases of the circulatory system: Secondary | ICD-10-CM | POA: Diagnosis not present

## 2020-11-15 DIAGNOSIS — Z79899 Other long term (current) drug therapy: Secondary | ICD-10-CM | POA: Insufficient documentation

## 2020-11-15 DIAGNOSIS — Z881 Allergy status to other antibiotic agents status: Secondary | ICD-10-CM | POA: Insufficient documentation

## 2020-11-15 DIAGNOSIS — N202 Calculus of kidney with calculus of ureter: Secondary | ICD-10-CM | POA: Diagnosis not present

## 2020-11-15 DIAGNOSIS — Z809 Family history of malignant neoplasm, unspecified: Secondary | ICD-10-CM | POA: Insufficient documentation

## 2020-11-15 DIAGNOSIS — K219 Gastro-esophageal reflux disease without esophagitis: Secondary | ICD-10-CM | POA: Diagnosis not present

## 2020-11-15 DIAGNOSIS — N201 Calculus of ureter: Secondary | ICD-10-CM

## 2020-11-15 DIAGNOSIS — N200 Calculus of kidney: Secondary | ICD-10-CM | POA: Insufficient documentation

## 2020-11-15 DIAGNOSIS — Z888 Allergy status to other drugs, medicaments and biological substances status: Secondary | ICD-10-CM | POA: Insufficient documentation

## 2020-11-15 DIAGNOSIS — Z885 Allergy status to narcotic agent status: Secondary | ICD-10-CM | POA: Diagnosis not present

## 2020-11-15 DIAGNOSIS — Z841 Family history of disorders of kidney and ureter: Secondary | ICD-10-CM | POA: Diagnosis not present

## 2020-11-15 DIAGNOSIS — E039 Hypothyroidism, unspecified: Secondary | ICD-10-CM | POA: Diagnosis not present

## 2020-11-15 DIAGNOSIS — Z833 Family history of diabetes mellitus: Secondary | ICD-10-CM | POA: Diagnosis not present

## 2020-11-15 DIAGNOSIS — N179 Acute kidney failure, unspecified: Secondary | ICD-10-CM | POA: Diagnosis not present

## 2020-11-15 HISTORY — DX: Sepsis, unspecified organism: A41.9

## 2020-11-15 HISTORY — PX: HOLMIUM LASER APPLICATION: SHX5852

## 2020-11-15 HISTORY — DX: Low back pain, unspecified: M54.50

## 2020-11-15 HISTORY — DX: Presence of spectacles and contact lenses: Z97.3

## 2020-11-15 HISTORY — DX: Hypothyroidism, unspecified: E03.9

## 2020-11-15 HISTORY — PX: CYSTOSCOPY/URETEROSCOPY/HOLMIUM LASER/STENT PLACEMENT: SHX6546

## 2020-11-15 SURGERY — CYSTOSCOPY/URETEROSCOPY/HOLMIUM LASER/STENT PLACEMENT
Anesthesia: General | Site: Renal | Laterality: Right

## 2020-11-15 MED ORDER — ACETAMINOPHEN 500 MG PO TABS
1000.0000 mg | ORAL_TABLET | Freq: Once | ORAL | Status: AC
  Administered 2020-11-15: 1000 mg via ORAL

## 2020-11-15 MED ORDER — LACTATED RINGERS IV SOLN: INTRAVENOUS | Status: DC

## 2020-11-15 MED ORDER — ONDANSETRON HCL 4 MG/2ML IJ SOLN
INTRAMUSCULAR | Status: DC | PRN
  Administered 2020-11-15: 4 mg via INTRAVENOUS

## 2020-11-15 MED ORDER — SCOPOLAMINE 1 MG/3DAYS TD PT72
MEDICATED_PATCH | TRANSDERMAL | Status: AC
  Filled 2020-11-15: qty 1

## 2020-11-15 MED ORDER — SCOPOLAMINE 1 MG/3DAYS TD PT72
1.0000 | MEDICATED_PATCH | TRANSDERMAL | Status: DC
  Administered 2020-11-15: 1.5 mg via TRANSDERMAL

## 2020-11-15 MED ORDER — OXYCODONE-ACETAMINOPHEN 5-325 MG PO TABS: 1.0000 | ORAL_TABLET | ORAL | 0 refills | Status: AC | PRN

## 2020-11-15 MED ORDER — HYDROMORPHONE HCL 1 MG/ML IJ SOLN: 0.2500 mg | INTRAMUSCULAR | Status: DC | PRN

## 2020-11-15 MED ORDER — ACETAMINOPHEN 500 MG PO TABS
ORAL_TABLET | ORAL | Status: AC
  Filled 2020-11-15: qty 2

## 2020-11-15 MED ORDER — PHENYLEPHRINE 40 MCG/ML (10ML) SYRINGE FOR IV PUSH (FOR BLOOD PRESSURE SUPPORT)
PREFILLED_SYRINGE | INTRAVENOUS | Status: AC
  Filled 2020-11-15: qty 20

## 2020-11-15 MED ORDER — DEXAMETHASONE SODIUM PHOSPHATE 10 MG/ML IJ SOLN
INTRAMUSCULAR | Status: DC | PRN
  Administered 2020-11-15: 10 mg via INTRAVENOUS

## 2020-11-15 MED ORDER — MIDAZOLAM HCL 2 MG/2ML IJ SOLN
INTRAMUSCULAR | Status: AC
  Filled 2020-11-15: qty 2

## 2020-11-15 MED ORDER — FENTANYL CITRATE (PF) 100 MCG/2ML IJ SOLN
INTRAMUSCULAR | Status: AC
  Filled 2020-11-15: qty 2

## 2020-11-15 MED ORDER — PHENYLEPHRINE HCL (PRESSORS) 10 MG/ML IV SOLN
INTRAVENOUS | Status: DC | PRN
  Administered 2020-11-15 (×3): 80 ug via INTRAVENOUS
  Administered 2020-11-15: 40 ug via INTRAVENOUS
  Administered 2020-11-15: 80 ug via INTRAVENOUS
  Administered 2020-11-15: 40 ug via INTRAVENOUS
  Administered 2020-11-15: 80 ug via INTRAVENOUS
  Administered 2020-11-15: 120 ug via INTRAVENOUS
  Administered 2020-11-15: 80 ug via INTRAVENOUS
  Administered 2020-11-15: 120 ug via INTRAVENOUS

## 2020-11-15 MED ORDER — ONDANSETRON HCL 4 MG/2ML IJ SOLN
INTRAMUSCULAR | Status: AC
  Filled 2020-11-15: qty 2

## 2020-11-15 MED ORDER — CEFAZOLIN SODIUM-DEXTROSE 2-4 GM/100ML-% IV SOLN
INTRAVENOUS | Status: AC
  Filled 2020-11-15: qty 100

## 2020-11-15 MED ORDER — LIDOCAINE 2% (20 MG/ML) 5 ML SYRINGE
INTRAMUSCULAR | Status: DC | PRN
  Administered 2020-11-15: 80 mg via INTRAVENOUS

## 2020-11-15 MED ORDER — OXYCODONE HCL 5 MG PO TABS: 5.0000 mg | ORAL_TABLET | Freq: Once | ORAL | Status: DC | PRN

## 2020-11-15 MED ORDER — MIDAZOLAM HCL 5 MG/5ML IJ SOLN
INTRAMUSCULAR | Status: DC | PRN
  Administered 2020-11-15: 1 mg via INTRAVENOUS

## 2020-11-15 MED ORDER — PROPOFOL 10 MG/ML IV BOLUS
INTRAVENOUS | Status: DC | PRN
  Administered 2020-11-15: 150 mg via INTRAVENOUS

## 2020-11-15 MED ORDER — CEPHALEXIN 500 MG PO CAPS: 500.0000 mg | ORAL_CAPSULE | Freq: Two times a day (BID) | ORAL | 0 refills | Status: AC

## 2020-11-15 MED ORDER — LIDOCAINE 2% (20 MG/ML) 5 ML SYRINGE
INTRAMUSCULAR | Status: AC
  Filled 2020-11-15: qty 5

## 2020-11-15 MED ORDER — DOCUSATE SODIUM 100 MG PO CAPS: 100.0000 mg | ORAL_CAPSULE | Freq: Every day | ORAL | 0 refills | Status: DC | PRN

## 2020-11-15 MED ORDER — SODIUM CHLORIDE 0.9 % IR SOLN
Status: DC | PRN
  Administered 2020-11-15: 3000 mL

## 2020-11-15 MED ORDER — PROMETHAZINE HCL 25 MG/ML IJ SOLN: 6.2500 mg | INTRAMUSCULAR | Status: DC | PRN

## 2020-11-15 MED ORDER — FENTANYL CITRATE (PF) 100 MCG/2ML IJ SOLN
INTRAMUSCULAR | Status: DC | PRN
  Administered 2020-11-15: 50 ug via INTRAVENOUS

## 2020-11-15 MED ORDER — IOHEXOL 300 MG/ML  SOLN
INTRAMUSCULAR | Status: DC | PRN
  Administered 2020-11-15: 10 mL

## 2020-11-15 MED ORDER — PROPOFOL 10 MG/ML IV BOLUS
INTRAVENOUS | Status: AC
  Filled 2020-11-15: qty 20

## 2020-11-15 MED ORDER — OXYCODONE HCL 5 MG/5ML PO SOLN: 5.0000 mg | Freq: Once | ORAL | Status: DC | PRN

## 2020-11-15 MED ORDER — DEXAMETHASONE SODIUM PHOSPHATE 10 MG/ML IJ SOLN
INTRAMUSCULAR | Status: AC
  Filled 2020-11-15: qty 1

## 2020-11-15 MED ORDER — EPHEDRINE SULFATE-NACL 50-0.9 MG/10ML-% IV SOSY
PREFILLED_SYRINGE | INTRAVENOUS | Status: DC | PRN
  Administered 2020-11-15: 5 mg via INTRAVENOUS
  Administered 2020-11-15: 15 mg via INTRAVENOUS
  Administered 2020-11-15: 2.5 mg via INTRAVENOUS

## 2020-11-15 MED ORDER — CEFAZOLIN SODIUM-DEXTROSE 2-4 GM/100ML-% IV SOLN
2.0000 g | Freq: Once | INTRAVENOUS | Status: AC
  Administered 2020-11-15: 2 g via INTRAVENOUS

## 2020-11-15 SURGICAL SUPPLY — 31 items
APL SKNCLS STERI-STRIP NONHPOA (GAUZE/BANDAGES/DRESSINGS) ×1
BAG DRAIN URO-CYSTO SKYTR STRL (DRAIN) ×2 IMPLANT
BAG DRN UROCATH (DRAIN) ×1
BASKET ZERO TIP NITINOL 2.4FR (BASKET) ×2 IMPLANT
BENZOIN TINCTURE PRP APPL 2/3 (GAUZE/BANDAGES/DRESSINGS) ×2 IMPLANT
BSKT STON RTRVL ZERO TP 2.4FR (BASKET) ×1
CATH SET URETHRAL DILATOR (CATHETERS) IMPLANT
CATH URET 5FR 28IN OPEN ENDED (CATHETERS) ×2 IMPLANT
CLOTH BEACON ORANGE TIMEOUT ST (SAFETY) ×2 IMPLANT
DRSG TEGADERM 2-3/8X2-3/4 SM (GAUZE/BANDAGES/DRESSINGS) ×2 IMPLANT
FIBER LASER FLEXIVA 365 (UROLOGICAL SUPPLIES) IMPLANT
GLOVE SURG ENC MOIS LTX SZ7 (GLOVE) ×2 IMPLANT
GLOVE SURG UNDER POLY LF SZ6.5 (GLOVE) ×4 IMPLANT
GOWN STRL REUS W/TWL LRG LVL3 (GOWN DISPOSABLE) ×4 IMPLANT
GUIDEWIRE STR DUAL SENSOR (WIRE) ×4 IMPLANT
GUIDEWIRE ZIPWRE .038 STRAIGHT (WIRE) IMPLANT
IV NS 1000ML (IV SOLUTION) ×2
IV NS 1000ML BAXH (IV SOLUTION) ×1 IMPLANT
IV NS IRRIG 3000ML ARTHROMATIC (IV SOLUTION) ×2 IMPLANT
KIT TURNOVER CYSTO (KITS) ×2 IMPLANT
MANIFOLD NEPTUNE II (INSTRUMENTS) ×2 IMPLANT
NS IRRIG 500ML POUR BTL (IV SOLUTION) ×2 IMPLANT
PACK CYSTO (CUSTOM PROCEDURE TRAY) ×2 IMPLANT
SHEATH URET ACCESS 12FR/35CM (UROLOGICAL SUPPLIES) ×2 IMPLANT
STENT URET 6FRX26 CONTOUR (STENTS) ×2 IMPLANT
SYR 10ML LL (SYRINGE) ×2 IMPLANT
TRACTIP FLEXIVA PULS ID 200XHI (Laser) ×1 IMPLANT
TRACTIP FLEXIVA PULSE ID 200 (Laser) ×2
TUBE CONNECTING 12X1/4 (SUCTIONS) ×2 IMPLANT
TUBE FEEDING 8FR 16IN STR KANG (MISCELLANEOUS) IMPLANT
TUBING UROLOGY SET (TUBING) ×2 IMPLANT

## 2020-11-15 NOTE — Anesthesia Postprocedure Evaluation (Signed)
Anesthesia Post Note  Patient: Sandy Sutton  Procedure(s) Performed: CYSTOSCOPY/RETROGRADE/URETEROSCOPY/HOLMIUM LASER/, STONE BASKETRY ,STENT REPLACEMENT (Right Renal) HOLMIUM LASER APPLICATION (Right Renal)     Patient location during evaluation: PACU Anesthesia Type: General Level of consciousness: awake and alert, oriented and patient cooperative Pain management: pain level controlled Vital Signs Assessment: post-procedure vital signs reviewed and stable Respiratory status: spontaneous breathing, nonlabored ventilation and respiratory function stable Cardiovascular status: blood pressure returned to baseline and stable Postop Assessment: no apparent nausea or vomiting Anesthetic complications: no   No complications documented.  Last Vitals:  Vitals:   11/15/20 1031 11/15/20 1045  BP:  107/64  Pulse:  96  Resp:  15  Temp: 36.7 C   SpO2:  93%    Last Pain:  Vitals:   11/15/20 1030  TempSrc:   PainSc: Ray

## 2020-11-15 NOTE — Op Note (Signed)
Operative Note  Preoperative diagnosis:  1.  Right renal pelvis stone  Postoperative diagnosis: 1.  Right renal pelvis stone  Procedure(s): 1.  Cystoscopy 2. Right ureteroscopy with laser lithotripsy and basket extraction of stones 3. Right retrograde pyelogram 4. Right ureteral stent exchange  5. Fluoroscopy with intraoperative interpretation  Surgeon: Rexene Alberts, MD  Assistants:  None  Anesthesia:  General  Complications:  None  EBL:  Minimal  Specimens: 1. Stones for stone analysis (to be done at [facility])  Drains/Catheters: 1.  Right 6Fr x 26cm ureteral stent with tether string  Intraoperative findings:   1. Cystoscopy demonstrated no suspicious bladder lesions. 2. Right ureteroscopy demonstrated 7mm R UPJ stone. This was fragmented and all fragments were removed. She was stone free at end of case. 3. Successful right ureteral stent placement.  Indication:  Sandy Sutton is a 65 y.o. female with a right renal pelvis stone. She presented to the ED on 10/24/2020 with complaints of right-sided flank pain, nausea, emesis and fevers. CT A/P 10/24/2020 demonstrated a 9 mm right UPJ stone with sign of infection and sepsis. She underwent right ureteral stent placement. Urine culture resulted no growth on 10/24/2020. He did undergo a course of IV and p.o. antibiotics. Follow-up urine culture in the office on 11/06/2020 was negative.  Description of procedure: After informed consent was obtained from the patient, the patient was identified and taken to the operating room and placed in the supine position.  General anesthesia was administered as well as perioperative IV antibiotics.  At the beginning of the case, a time-out was performed to properly identify the patient, the surgery to be performed, and the surgical site.  Sequential compression devices were applied to the lower extremities at the beginning of the case for DVT prophylaxis.  The patient was then placed in the  dorsal lithotomy supine position, prepped and draped in sterile fashion.  Preliminary scout fluoroscopy revealed that there was a 45mm calcification area at the right renal pelvis, which corresponds to the stone found on the preoperative CT scan. We then passed the 21-French rigid cystoscope through the urethra and into the bladder under vision without any difficulty , noting a normal urethra without strictures.  A systematic evaluation of the bladder revealed no evidence of any suspicious bladder lesions.  Ureteral orifices were in normal position.    The distal aspect of the ureteral stent was seen protruding from the right ureteral orifice.  We then used the alligator-tooth forceps and grasped the distal end of the ureteral stent and brought it out the urethral meatus while watching the proximal coil straighten out nicely on fluoroscopy. Through the ureteral stent, we then passed a 0.038 sensor wire up to the level of the renal pelvis.  The ureteral stent was then removed, leaving the sensor wire up the right ureter.    A semi-rigid ureteroscope was passed alongside the wire up the distal ureter which appeared normal. A second 0.038 sensor wire was passed under direct vision and the semirigid scope was removed. A 12/14Fr ureteral access sheath was carefully advanced up the ureter to the level of the UPJ over this wire under fluoroscopic guidance. The flexible ureteroscope was advanced into the collecting system via the access sheath. The collecting system was inspected. The calculus was identified at the renal pelvis. It was then repositioned into an upper pole calyx. Using the 200 micron holmium laser fiber, the stone was fragmented completely with 0.8J and 8Hz . A 2.2 Fr zero tip basket  was used to remove the fragments under visual guidance. These were sent for chemical analysis. With the ureteroscope in the kidney, a gentle pyelogram was performed to delineate the calyceal system and we evaluated the  calyces systematically. We encountered a no further stones. The rest of the stone fragments were very tiny, like sand and these were  irrigated away gently. The calyces were re-inspected and there were no significant stone fragment residual.   We then withdrew the ureteroscope back down the ureter along with the access sheath, noting no evidence of any stones along the course of the ureter.  Prior to removing the ureteroscope, we did pass the Glidewire back up to the ureter to the renal pelvis.  Once the ureteroscope was removed, we then used the Glidewire under fluoroscopic guidance and passed up a 6-French x 26 cm double-pigtail ureteral stent up the ureter, making sure that the proximal and distal ends coiled within the kidney and bladder respectively. We then reintroduced the scope and drained her bladder. Urine was seen draining from the stent.  Note that we left a  long tether string attached to the distal end of the ureteral stent and it exited the urethral meatus and was secured to the inner thigh with a tegaderm adhesive.  The cystoscope was then advanced back into the bladder under vision.  We were able to see the distal stent coiling nicely within the bladder.  The bladder was then emptied with irrigation solution.  The cystoscope was then removed.    The patient tolerated the procedure well and there was no complication. Patient was awoken from anesthesia and taken to the recovery room in stable condition. I was present and scrubbed for the entirety of the case.  Plan:  Patient will be discharged home. She will remove stent in 3 days. She will followup in the office in 1 month with renal ultrasound.  Matt R. [facility]  Pager: [phone]

## 2020-11-15 NOTE — H&P (Signed)
Office Visit Report     11/14/2020   --------------------------------------------------------------------------------   Sandy Sutton  MRN: [id number]  DOB: 18-Jul-1955, 65 year old Female  SSN:    PRIMARY CARE:  Lucianne Lei, MD  REFERRING:  Lucianne Lei, MD  PROVIDER:  Rexene Alberts, M.D.  LOCATION:  [facility] [phone]     --------------------------------------------------------------------------------   CC/HPI: Sandy Sutton is a 65 year old female seen in consultation today for right renal pelvis stone.   She presented to the ED on 10/24/2020 with complaints of right-sided flank pain, nausea, emesis and fevers. CT A/P 10/24/2020 demonstrated a 9 mm right UPJ stone with sign of infection and sepsis. Urine culture resulted no growth on 10/24/2020. He did undergo a course of IV and p.o. antibiotics. Follow-up urine culture in the office on 11/06/2020 was negative. She is scheduled to undergo right ureteroscopy with laser lithotripsy on 11/15/2020. She denies abdominal pain or flank pain. She denies fevers or chills. She denies dysuria. She does have some intermittent stent discomfort when she voids.     ALLERGIES: Darvon Erythromycin Hydrocodone Propoxyphene Hcl Vicodin    MEDICATIONS: Allegra-D 12 Hour  Amoxicillin  Fluticasone Propionate  Methocarbamol  Multivitamin  Pantoprazole Sodium  Pantoprazole Sodium  Promethazine Hcl  Salonpas  Tylenol 8 Hour 650 mg tablet, extended release  Vitamin B-12  Vitamin D3     GU PSH: None   NON-GU PSH: Arthroscopic Knee Surgery - 2015 Bilateral Tubal Ligation - 1995 Colonoscopy Knee replacement - 2016 Thyroidectomy - 2014     GU PMH: None   NON-GU PMH: Arthritis Encounter for general adult medical examination without abnormal findings, Encounter for preventive health examination GERD Gout    FAMILY HISTORY: 1 Daughter - Runs in Family 2 sons - Runs in Family Cancer - Runs in Family Diabetes -  Runs in Family Heart Disease - Runs in Family Hypertension - Runs in Family Kidney Failure - Runs in Family Prostate Cancer - Runs in Family   SOCIAL HISTORY: Marital Status: Divorced Ethnicity: Not Hispanic Or Latino; Race: Black or African American Current Smoking Status: Patient has never smoked.   Tobacco Use Assessment Completed: Used Tobacco in last 30 days? Does drink.  Drinks 1 caffeinated drink per day.    REVIEW OF SYSTEMS:    GU Review Female:   Patient reports frequent urination, hard to postpone urination, burning /pain with urination, and get up at night to urinate. Patient denies leakage of urine, stream starts and stops, trouble starting your stream, have to strain to urinate, and being pregnant.  Gastrointestinal (Upper):   Patient reports nausea. Patient denies vomiting and indigestion/ heartburn.  Gastrointestinal (Lower):   Patient denies diarrhea and constipation.  Constitutional:   Patient reports night sweats and fatigue. Patient denies fever and weight loss.  Skin:   Patient denies skin rash/ lesion and itching.  Eyes:   Patient reports blurred vision. Patient denies double vision.  Ears/ Nose/ Throat:   Patient reports sinus problems. Patient denies sore throat.  Hematologic/Lymphatic:   Patient denies swollen glands and easy bruising.  Cardiovascular:   Patient reports chest pains. Patient denies leg swelling.  Respiratory:   Patient reports shortness of breath. Patient denies cough.  Endocrine:   Patient denies excessive thirst.  Musculoskeletal:   Patient reports back pain and joint pain.   Neurological:   Patient reports headaches and dizziness.   Psychologic:   Patient denies depression and anxiety.   VITAL SIGNS:  11/14/2020 11:05 AM  Weight 160 lb / 72.57 kg  Height 62.5 in / 158.75 cm  BP 121/79 mmHg  Pulse 120 /min  Temperature 97.2 F / 36.2 C  BMI 28.8 kg/m   MULTI-SYSTEM PHYSICAL EXAMINATION:    Constitutional: Well-nourished. No  physical deformities. Normally developed. Good grooming.  Respiratory: No labored breathing, no use of accessory muscles.   Cardiovascular: Normal temperature, normal extremity pulses, no swelling, no varicosities.  Gastrointestinal: No mass, no tenderness, no rigidity, non obese abdomen. No CVA tenderness     Complexity of Data:  Source Of History:  Patient, Medical Record Summary  Records Review:   AUA Symptom Score  Urine Test Review:   Urinalysis, Urine Culture  X-Ray Review: C.T. Abdomen/Pelvis: Reviewed Films. Reviewed Report. Discussed With Patient.     PROCEDURES:          Urinalysis w/Scope Dipstick Dipstick Cont'd Micro  Color: Yellow Bilirubin: Neg mg/dL WBC/hpf: 40 - 60/hpf  Appearance: Cloudy Ketones: Neg mg/dL RBC/hpf: 20 - 40/hpf  Specific Gravity: 1.025 Blood: 2+ ery/uL Bacteria: Many (>50/hpf)  pH: 5.5 Protein: 1+ mg/dL Cystals: NS (Not Seen)  Glucose: Neg mg/dL Urobilinogen: 0.2 mg/dL Casts: NS (Not Seen)    Nitrites: Positive Trichomonas: Not Present    Leukocyte Esterase: 3+ leu/uL Mucous: Not Present      Epithelial Cells: 0 - 5/hpf      Yeast: NS (Not Seen)      Sperm: Not Present    ASSESSMENT:      ICD-10 Details  1 GU:   Renal calculus - N20.0    PLAN:           Document Letter(s):  Created for Patient: Clinical Summary         Notes:    #1. Right renal pelvis stone:  -We discussed risk and benefits and surgical options. She like to proceed with right ureteroscopy with laser lithotripsy and basket extraction of stone. This is scheduled for 11/15/2020.  -Urine culture on 11/06/2020 resulted no growth.  -We will plan to follow-up outpatient afterwards for metabolic evaluation.   We discussed the options for management of kidney stones, including observation, ESWL, ureteroscopy with laser lithotripsy, and PCNL. The risks and benefits of each option were discussed.  For observation I described the risks which include but are not limited to silent renal  damage, life-threatening infection, need for emergent surgery, failure to pass stone, and pain.   ESWL: risks and benefits of ESWL were outlined including infection, bleeding, pain, steinstrasse, kidney injury, need for ancillary treatments, and global anesthesia risks including but not limited to CVA, MI, DVT, PE, pneumonia, and death.   Ureteroscopy: risks and benefits of ureteroscopy were outlined, including infection, bleeding, pain, temporary ureteral stent and associated stent bother, ureteral injury, ureteral stricture, need for ancillary treatments, and global anesthesia risks including but not limited to CVA, MI, DVT, PE, pneumonia, and death.   PCNL: risks and benefits of PCNL were outlined including infection, bleeding, blood transfusion, pain, pneumothorax, bowel injury, persistent urine leak, positioning injury, inability to clear stone burden, renal laceration, arterial venous fistula or malformation, need for ancillary treatments, and global anesthesia risks including but not limited to CVA, MI, DVT, PE, pneumonia, and death.   We discussed dietary methods for stone prevention including the following: increased water intake to 2-3 liters per day, add lemon or lemon concentrate to water to increase citrate which is beneficial for stone prevention, limiting dietary sodium to less than 2000 mg per  day, limiting animal protein to less than 2 servings (16 ounces/day), and limiting foods high in oxalate content (spinach, beans, chocolate, etc.).    CC: Lucianne Lei, MD   Urology Preoperative H&P   Chief Complaint: Right renal pelvis stone  History of Present Illness: Sandy Sutton is a 65 y.o. female with right renal pelvis stone here for right URS/LL. She had negative preop UCx on 11/06/2020.    Past Medical History:  Diagnosis Date  . Arthritis    hands and feet  . GERD (gastroesophageal reflux disease)   . Gout yrs ago   hx of  . History of bronchitis 2015  . History of  colon polyps benign  . History of gastric ulcer yrs ago  . History of migraine   . Hypothyroidism   . Joint pain   . Joint swelling    bil knee and wrist  . Lower back pain   . Pneumonia last time 2019  . Sepsis ([place]) admit 10-24-2020 to 10-29-2020 at [facility]   with acute kidney injury  . Urinary urgency   . Weakness    right hand and pt states from pinched nerve  . Wears glasses     Past Surgical History:  Procedure Laterality Date  . Colonosscopy  2016  . CYSTOSCOPY W/ URETERAL STENT PLACEMENT Right 10/24/2020   Procedure: CYSTOSCOPY WITH RETROGRADE PYELOGRAM/URETERAL STENT PLACEMENT;  Surgeon: Janith Lima, MD;  Location: [place];  Service: Urology;  Laterality: Right;  . ESOPHAGOGASTRODUODENOSCOPY  yrs ago  . KNEE ARTHROPLASTY Left 03/13/2015   Procedure: COMPUTER ASSISTED TOTAL KNEE ARTHROPLASTY; LEFT KNEE;  Surgeon: [name], MD;  Location: [place];  Service: Orthopedics;  Laterality: Left;  . KNEE ARTHROSCOPY Left 2015  . THYROID LOBECTOMY Right 08/19/2012   Procedure: THYROID LOBECTOMY;  Surgeon: [name], MD;  Location: [facility];  Service: General;  Laterality: Right;  . TUBAL LIGATION  1995    Allergies:  Allergies  Allergen Reactions  . Darvon Other (See Comments)    Hallucinations  . Propoxyphene Other (See Comments)    Hallucinations  . Erythromycin Nausea And Vomiting and Rash  . Hydrocodone Nausea And Vomiting    Family History  Problem Relation Age of Onset  . Alzheimer's disease Father   . Cancer Mother   . Hypertension Sister   . Breast cancer Sister   . Lupus Sister   . Cancer Brother     Social History:  reports that she has never smoked. She has never used smokeless tobacco. She reports current alcohol use. She reports that she does not use drugs.  ROS: A complete review of systems was performed.  All systems are negative except for pertinent findings as noted.  Physical Exam:  Vital signs in last 24 hours: Temp:  [98.9 F (37.2 C)] 98.9 F  (37.2 C) (06/03 0827) Pulse Rate:  [98] 98 (06/03 0827) Resp:  [16] 16 (06/03 0827) BP: (110)/(71) 110/71 (06/03 0827) SpO2:  [99 %] 99 % (06/03 0827) Weight:  [74.3 kg] 74.3 kg (06/03 0827) Constitutional:  Alert and oriented, No acute distress Cardiovascular: Regular rate and rhythm Respiratory: Normal respiratory effort, Lungs clear bilaterally GI: Abdomen is soft, nontender, nondistended, no abdominal masses GU: No CVA tenderness Lymphatic: No lymphadenopathy Neurologic: Grossly intact, no focal deficits Psychiatric: Normal mood and affect  Laboratory Data:  No results for input(s): WBC, HGB, HCT, PLT in the last 72 hours.  No results for input(s): NA, K, CL, GLUCOSE, BUN, CALCIUM, CREATININE  in the last 72 hours.  Invalid input(s): CO3   No results found for this or any previous visit (from the past 24 hour(s)). No results found for this or any previous visit (from the past 240 hour(s)).  Renal Function: No results for input(s): CREATININE in the last 168 hours. Estimated Creatinine Clearance: 44.4 mL/min (A) (by C-G formula based on SCr of 1.19 mg/dL (H)).  Radiologic Imaging: No results found.  I independently reviewed the above imaging studies.  Assessment and Plan Sandy Sutton is a 65 y.o. female with right renal pelvis stone here for right URS/LL. -Neg urine cx 11/06/2020  -The risks, benefits and alternatives of cystoscopy with right ureteroscopy/laser lithotripsy, right JJ stent placement was discussed with the patient.  Risks include, but are not limited to: bleeding, urinary tract infection, ureteral injury, ureteral stricture disease, chronic pain, urinary symptoms, bladder injury, stent migration, the need for nephrostomy tube placement, MI, CVA, DVT, PE and the inherent risks with general anesthesia.  The patient voices understanding and wishes to proceed.      Matt R. Verta Riedlinger MD 11/15/2020, 9:01 AM  [facility] Pager: [phone]): [phone]

## 2020-11-15 NOTE — Transfer of Care (Signed)
Immediate Anesthesia Transfer of Care Note  Patient: Greysen Swanton  Procedure(s) Performed: CYSTOSCOPY/RETROGRADE/URETEROSCOPY/HOLMIUM LASER/, STONE BASKETRY ,STENT REPLACEMENT (Right Renal) HOLMIUM LASER APPLICATION (Right Renal)  Patient Location: PACU  Anesthesia Type:General  Level of Consciousness: drowsy, patient cooperative and responds to stimulation  Airway & Oxygen Therapy: Patient Spontanous Breathing  Post-op Assessment: Report given to RN and Post -op Vital signs reviewed and stable  Post vital signs: Reviewed and stable  Last Vitals:  Vitals Value Taken Time  BP 114/63 11/15/20 1031  Temp 36.7 C 11/15/20 1031  Pulse 94 11/15/20 1035  Resp 14 11/15/20 1035  SpO2 93 % 11/15/20 1035  Vitals shown include unvalidated device data.  Last Pain:  Vitals:   11/15/20 1030  TempSrc:   PainSc: Asleep      Patients Stated Pain Goal: 4 (54/98/26 4158)  Complications: No complications documented.

## 2020-11-15 NOTE — Anesthesia Procedure Notes (Signed)
Procedure Name: LMA Insertion Date/Time: 11/15/2020 9:27 AM Performed by: Rogers Blocker, CRNA Pre-anesthesia Checklist: Patient identified, Emergency Drugs available, Suction available and Patient being monitored Patient Re-evaluated:Patient Re-evaluated prior to induction Oxygen Delivery Method: Circle System Utilized Preoxygenation: Pre-oxygenation with 100% oxygen Induction Type: IV induction Ventilation: Mask ventilation without difficulty LMA: LMA inserted LMA Size: 4.0 Number of attempts: 1 Placement Confirmation: positive ETCO2 Tube secured with: Tape Dental Injury: Teeth and Oropharynx as per pre-operative assessment

## 2020-11-15 NOTE — Discharge Instructions (Signed)
[facility] [phone] Post Ureteroscopy With or Without Stent Instructions  Definitions:  Ureter: The duct that transports urine from the kidney to the bladder. Stent:   A plastic hollow tube that is placed into the ureter, from the kidney to the bladder to prevent the ureter from swelling shut.  GENERAL INSTRUCTIONS:  Despite the fact that no skin incisions were used, the area around the ureter and bladder is raw and irritated. The stent is a foreign body which will further irritate the bladder wall. This irritation is manifested by increased frequency of urination, both day and night, and by an increase in the urge to urinate. In some, the urge to urinate is present almost always. Sometimes the urge is strong enough that you may not be able to stop yourself from urinating. The only real cure is to remove the stent and then give time for the bladder wall to heal which can't be done until the danger of the ureter swelling shut has passed, which varies.  You may see some blood in your urine while the stent is in place and a few days afterwards. Do not be alarmed, even if the urine was clear for a while. Get off your feet and drink lots of fluids until clearing occurs. If you start to pass clots or don't improve, call us.  DIET: You may return to your normal diet immediately. Because of the raw surface of your bladder, alcohol, spicy foods, acid type foods and drinks with caffeine may cause irritation or frequency and should be used in moderation. To keep your urine flowing freely and to avoid constipation, drink plenty of fluids during the day ( 8-10 glasses ). Tip: Avoid cranberry juice because it is very acidic.  ACTIVITY: Your physical activity doesn't need to be restricted. However, if you are very active, you may see some blood in your urine. We suggest that you reduce your activity under these circumstances until the bleeding has stopped.  BOWELS: It is important to  keep your bowels regular during the postoperative period. Straining with bowel movements can cause bleeding. A bowel movement every other day is reasonable. Use a mild laxative if needed, such as Milk of Magnesia 2-3 tablespoons, or 2 Dulcolax tablets. Call if you continue to have problems. If you have been taking narcotics for pain, before, during or after your surgery, you may be constipated. Take a laxative if necessary.   MEDICATION: You should resume your pre-surgery medications unless told not to. In addition you will often be given an antibiotic to prevent infection. These should be taken as prescribed until the bottles are finished unless you are having an unusual reaction to one of the drugs.  PROBLEMS YOU SHOULD REPORT TO [place]:  Fevers over 100.5 Fahrenheit.  Heavy bleeding, or clots ( See above notes about blood in urine ).  Inability to urinate.  Drug reactions ( hives, rash, nausea, vomiting, diarrhea ).  Severe burning or pain with urination that is not improving.  FOLLOW-UP: You will need a follow-up appointment to monitor your progress. Call for this appointment at the number listed above. Usually the first appointment will be about three to fourteen days after your surgery.  You have a stent in place which is attached to your thigh. Remove stent on Monday AM by pulling on attached string.   Post Anesthesia Home Care Instructions  Activity: Get plenty of rest for the remainder of the day. A responsible individual must stay with you for 24 hours  following the procedure.  For the next 24 hours, DO NOT: -Drive a car -Paediatric nurse -Drink alcoholic beverages -Take any medication unless instructed by your physician -Make any legal decisions or sign important papers.  Meals: Start with liquid foods such as gelatin or soup. Progress to regular foods as tolerated. Avoid greasy, spicy, heavy foods. If nausea and/or vomiting occur, drink only clear liquids until the nausea  and/or vomiting subsides. Call your physician if vomiting continues.  Special Instructions/Symptoms: Your throat may feel dry or sore from the anesthesia or the breathing tube placed in your throat during surgery. If this causes discomfort, gargle with warm salt water. The discomfort should disappear within 24 hours.  If you had a scopolamine patch placed behind your ear for the management of post- operative nausea and/or vomiting:  1. The medication in the patch is effective for 72 hours, after which it should be removed.  Wrap patch in a tissue and discard in the trash. Wash hands thoroughly with soap and water. 2. You may remove the patch earlier than 72 hours if you experience unpleasant side effects which may include dry mouth, dizziness or visual disturbances. 3. Avoid touching the patch. Wash your hands with soap and water after contact with the patch.

## 2020-11-15 NOTE — Anesthesia Preprocedure Evaluation (Addendum)
Anesthesia Evaluation  Patient identified by MRN, date of birth, ID band Patient awake    Reviewed: Allergy & Precautions, NPO status , Patient's Chart, lab work & pertinent test results  Airway Mallampati: II  TM Distance: >3 FB Neck ROM: Full    Dental no notable dental hx. (+) Teeth Intact, Dental Advisory Given   Pulmonary neg pulmonary ROS,    Pulmonary exam normal breath sounds clear to auscultation       Cardiovascular negative cardio ROS Normal cardiovascular exam Rhythm:Regular Rate:Normal     Neuro/Psych negative neurological ROS  negative psych ROS   GI/Hepatic Neg liver ROS, PUD, GERD  Medicated,  Endo/Other  Hypothyroidism   Renal/GU Renal InsufficiencyRenal disease (right ureteral stone)Admitted to [facility] 10/24/20- 10/29/20 w/ urosepsis 2/2 renal stone Cr 1.19 Bladder dysfunction (urgency)      Musculoskeletal  (+) Arthritis , Osteoarthritis,    Abdominal   Peds  Hematology  (+) Blood dyscrasia, anemia , H/H 9.1/28   Anesthesia Other Findings   Reproductive/Obstetrics negative OB ROS                            Anesthesia Physical Anesthesia Plan  ASA: III  Anesthesia Plan: General   Post-op Pain Management:    Induction: Intravenous  PONV Risk Score and Plan: 4 or greater and Ondansetron, Dexamethasone, Midazolam, Treatment may vary due to age or medical condition and Scopolamine patch - Pre-op  Airway Management Planned: LMA  Additional Equipment: None  Intra-op Plan:   Post-operative Plan: Extubation in OR  Informed Consent: I have reviewed the patients History and Physical, chart, labs and discussed the procedure including the risks, benefits and alternatives for the proposed anesthesia with the patient or authorized representative who has indicated his/her understanding and acceptance.     Dental advisory given  Plan Discussed with: CRNA  Anesthesia Plan  Comments:        Anesthesia Quick Evaluation

## 2020-11-18 ENCOUNTER — Encounter (HOSPITAL_BASED_OUTPATIENT_CLINIC_OR_DEPARTMENT_OTHER): Payer: Self-pay | Admitting: Urology

## 2020-11-18 DIAGNOSIS — N200 Calculus of kidney: Secondary | ICD-10-CM | POA: Diagnosis not present

## 2020-11-20 DIAGNOSIS — E860 Dehydration: Secondary | ICD-10-CM | POA: Diagnosis not present

## 2020-11-20 DIAGNOSIS — A419 Sepsis, unspecified organism: Secondary | ICD-10-CM | POA: Diagnosis not present

## 2020-12-18 DIAGNOSIS — N200 Calculus of kidney: Secondary | ICD-10-CM | POA: Diagnosis not present

## 2021-01-15 DIAGNOSIS — Z86010 Personal history of colonic polyps: Secondary | ICD-10-CM | POA: Diagnosis not present

## 2021-01-15 DIAGNOSIS — Z800 Family history of malignant neoplasm of digestive organs: Secondary | ICD-10-CM | POA: Diagnosis not present

## 2021-01-15 DIAGNOSIS — N132 Hydronephrosis with renal and ureteral calculous obstruction: Secondary | ICD-10-CM | POA: Diagnosis not present

## 2021-01-20 DIAGNOSIS — Z87442 Personal history of urinary calculi: Secondary | ICD-10-CM | POA: Diagnosis not present

## 2021-01-20 DIAGNOSIS — M5459 Other low back pain: Secondary | ICD-10-CM | POA: Diagnosis not present

## 2021-01-20 DIAGNOSIS — R5383 Other fatigue: Secondary | ICD-10-CM | POA: Diagnosis not present

## 2021-01-20 DIAGNOSIS — E782 Mixed hyperlipidemia: Secondary | ICD-10-CM | POA: Diagnosis not present

## 2021-01-20 DIAGNOSIS — N132 Hydronephrosis with renal and ureteral calculous obstruction: Secondary | ICD-10-CM | POA: Diagnosis not present

## 2021-01-20 DIAGNOSIS — A084 Viral intestinal infection, unspecified: Secondary | ICD-10-CM | POA: Diagnosis not present

## 2021-01-22 ENCOUNTER — Ambulatory Visit
Admission: RE | Admit: 2021-01-22 | Discharge: 2021-01-22 | Disposition: A | Discharge status: Home / Self Care | Payer: Medicare Other | Source: Ambulatory Visit | Attending: Family Medicine | Admitting: Family Medicine

## 2021-01-22 ENCOUNTER — Other Ambulatory Visit: Payer: Self-pay | Admitting: Family Medicine

## 2021-01-22 DIAGNOSIS — M545 Low back pain, unspecified: Secondary | ICD-10-CM | POA: Diagnosis not present

## 2021-01-22 DIAGNOSIS — M47814 Spondylosis without myelopathy or radiculopathy, thoracic region: Secondary | ICD-10-CM | POA: Diagnosis not present

## 2021-01-22 DIAGNOSIS — R52 Pain, unspecified: Secondary | ICD-10-CM

## 2021-01-28 DIAGNOSIS — D124 Benign neoplasm of descending colon: Secondary | ICD-10-CM | POA: Diagnosis not present

## 2021-01-28 DIAGNOSIS — K635 Polyp of colon: Secondary | ICD-10-CM | POA: Diagnosis not present

## 2021-01-28 DIAGNOSIS — Z86010 Personal history of colonic polyps: Secondary | ICD-10-CM | POA: Diagnosis not present

## 2021-02-20 DIAGNOSIS — R2689 Other abnormalities of gait and mobility: Secondary | ICD-10-CM | POA: Diagnosis not present

## 2021-02-20 DIAGNOSIS — M549 Dorsalgia, unspecified: Secondary | ICD-10-CM | POA: Diagnosis not present

## 2021-02-26 DIAGNOSIS — M549 Dorsalgia, unspecified: Secondary | ICD-10-CM | POA: Diagnosis not present

## 2021-02-26 DIAGNOSIS — R2689 Other abnormalities of gait and mobility: Secondary | ICD-10-CM | POA: Diagnosis not present

## 2021-02-27 DIAGNOSIS — Z87442 Personal history of urinary calculi: Secondary | ICD-10-CM | POA: Diagnosis not present

## 2021-02-28 DIAGNOSIS — R2689 Other abnormalities of gait and mobility: Secondary | ICD-10-CM | POA: Diagnosis not present

## 2021-02-28 DIAGNOSIS — M549 Dorsalgia, unspecified: Secondary | ICD-10-CM | POA: Diagnosis not present

## 2021-03-05 DIAGNOSIS — M549 Dorsalgia, unspecified: Secondary | ICD-10-CM | POA: Diagnosis not present

## 2021-03-05 DIAGNOSIS — R2689 Other abnormalities of gait and mobility: Secondary | ICD-10-CM | POA: Diagnosis not present

## 2021-03-13 DIAGNOSIS — M940 Chondrocostal junction syndrome [Tietze]: Secondary | ICD-10-CM | POA: Diagnosis not present

## 2021-03-13 DIAGNOSIS — J301 Allergic rhinitis due to pollen: Secondary | ICD-10-CM | POA: Diagnosis not present

## 2021-03-13 DIAGNOSIS — Z8249 Family history of ischemic heart disease and other diseases of the circulatory system: Secondary | ICD-10-CM | POA: Diagnosis not present

## 2021-03-21 DIAGNOSIS — N200 Calculus of kidney: Secondary | ICD-10-CM | POA: Diagnosis not present

## 2021-04-08 DIAGNOSIS — R82991 Hypocitraturia: Secondary | ICD-10-CM | POA: Diagnosis not present

## 2021-04-08 DIAGNOSIS — H524 Presbyopia: Secondary | ICD-10-CM | POA: Diagnosis not present

## 2021-04-08 DIAGNOSIS — R82993 Hyperuricosuria: Secondary | ICD-10-CM | POA: Diagnosis not present

## 2021-04-08 DIAGNOSIS — Z87442 Personal history of urinary calculi: Secondary | ICD-10-CM | POA: Diagnosis not present

## 2021-04-29 NOTE — Progress Notes (Signed)
Date:  04/30/2021   ID:  Sandy Sutton, DOB [birth year], MRN [id number]  PCP:  Sandy Lei, MD  Cardiologist:  Sandy Kras, DO, [facility]  (established care 04/30/2021)   REASON FOR CONSULT: Family history of ischemic heart disease   REQUESTING PHYSICIAN:  Sandy Lei, MD [address] [place],  [place] [id number]  Chief Complaint  Patient presents with   Family history of ischemic heart disease    New Patient (Initial Visit)    HPI  Sandy Sutton is a 65 y.o. African-American female who presents to the office with a chief complaint of " chest pain."   She is referred to the office at the request of Sandy Lei, MD for evaluation of family history of ischemic heart disease .  Patient has been diagnosed with costochondritis on multiple occasions.  However due to family history of premature CAD and an extended cardiovascular history involving her paternal side of the family she is referred to cardiology for further evaluation and management.  Patient states that she is been having chest pain below through the left breast, intermittent, worse either at night or early in the morning, intensity 5 out of 10, dull-like sensation, lasting for a minute or so, nonexertional, does not improve with resting.  The symptoms do get better with relaxing and calming down.  No change in physical endurance.  The pain at times is also pleuritic.  The pain is nonpositional.  Father had his first myocardial infarction at the age of 56.  And she has had 3 paternal uncles who has had myocardial infarctions at a young age.   FUNCTIONAL STATUS: Walks at least 3 miles on a daily basis.  And also participates in water activities in the pool.  ALLERGIES: Allergies  Allergen Reactions   Darvon Other (See Comments)    Hallucinations   Propoxyphene Other (See Comments)    Hallucinations   Erythromycin Nausea And Vomiting and Rash   Hydrocodone Nausea And Vomiting    MEDICATION LIST PRIOR TO  VISIT: Current Meds  Medication Sig   acetaminophen (TYLENOL) 650 MG CR tablet Take 1,300 mg by mouth every 8 (eight) hours as needed for pain.   Cholecalciferol (D3 PO) Take 1 capsule by mouth daily.   fluticasone (FLONASE) 50 MCG/ACT nasal spray Place 1-2 sprays into both nostrils daily as needed for allergies or rhinitis.   Multiple Vitamin (MULTIVITAMIN WITH MINERALS) TABS tablet Take 1 tablet by mouth daily.   Multiple Vitamins-Minerals (HAIR/SKIN/NAILS/BIOTIN PO) Take 1 tablet by mouth 3 (three) times a week.   vitamin C (ASCORBIC ACID) 500 MG tablet Take 1,000 mg by mouth daily.     PAST MEDICAL HISTORY: Past Medical History:  Diagnosis Date   Arthritis    hands and feet   GERD (gastroesophageal reflux disease)    Gout yrs ago   hx of   History of bronchitis 2015   History of colon polyps benign   History of gastric ulcer yrs ago   History of migraine    Hypothyroidism    Joint pain    Joint swelling    bil knee and wrist   Lower back pain    Pneumonia last time 2019   Sepsis [facility]) admit 10-24-2020 to 10-29-2020 at [facility]   with acute kidney injury   Urinary urgency    Weakness    right hand and pt states from pinched nerve   Wears glasses     PAST SURGICAL HISTORY: Past Surgical History:  Procedure Laterality Date   Colonosscopy  2016   CYSTOSCOPY W/ URETERAL STENT PLACEMENT Right 10/24/2020   Procedure: CYSTOSCOPY WITH RETROGRADE PYELOGRAM/URETERAL STENT PLACEMENT;  Surgeon: Sandy Lima, MD;  Location: [place];  Service: Urology;  Laterality: Right;   CYSTOSCOPY/URETEROSCOPY/HOLMIUM LASER/STENT PLACEMENT Right 11/15/2020   Procedure: CYSTOSCOPY/RETROGRADE/URETEROSCOPY/HOLMIUM LASER/, STONE BASKETRY ,STENT REPLACEMENT;  Surgeon: Sandy Lima, MD;  Location: [facility];  Service: Urology;  Laterality: Right;   ESOPHAGOGASTRODUODENOSCOPY  yrs ago   HOLMIUM LASER APPLICATION Right 16/03/9603   Procedure: HOLMIUM LASER APPLICATION;  Surgeon: Sandy Lima, MD;  Location: [facility];  Service: Urology;  Laterality: Right;   KIDNEY STONE SURGERY     Kidney stone removal   KNEE ARTHROPLASTY Left 03/13/2015   Procedure: COMPUTER ASSISTED TOTAL KNEE ARTHROPLASTY; LEFT KNEE;  Surgeon: Sandy Killings, MD;  Location: [place];  Service: Orthopedics;  Laterality: Left;   KNEE ARTHROSCOPY Left 2015   THYROID LOBECTOMY Right 08/19/2012   Procedure: THYROID LOBECTOMY;  Surgeon: Sandy Regal, MD;  Location: [facility];  Service: General;  Laterality: Right;   TUBAL LIGATION  1995    FAMILY HISTORY: The patient family history includes Alzheimer's disease in her father; Breast cancer in her sister; Cancer in her brother and mother; Hypertension in her sister; Lupus in her sister.  SOCIAL HISTORY:  The patient  reports that she has never smoked. She has never used smokeless tobacco. She reports current alcohol use. She reports that she does not use drugs.  REVIEW OF SYSTEMS: Review of Systems  Constitutional: Negative for chills and fever.  HENT:  Negative for hoarse voice and nosebleeds.   Eyes:  Negative for discharge, double vision and pain.  Cardiovascular:  Positive for chest pain. Negative for claudication, dyspnea on exertion, leg swelling, near-syncope, orthopnea, palpitations, paroxysmal nocturnal dyspnea and syncope.  Respiratory:  Negative for hemoptysis and shortness of breath.   Musculoskeletal:  Negative for muscle cramps and myalgias.  Gastrointestinal:  Negative for abdominal pain, constipation, diarrhea, hematemesis, hematochezia, melena, nausea and vomiting.  Neurological:  Positive for light-headedness. Negative for dizziness.   PHYSICAL EXAM: Vitals with BMI 04/30/2021 11/15/2020 11/15/2020  Height $Remov'5\' 2"'CjoVSw$  - -  Weight 168 lbs 10 oz - -  BMI 54.09 - -  Systolic 811 914 782  Diastolic 71 59 64  Pulse 84 92 96    CONSTITUTIONAL: Well-developed and well-nourished. No acute distress.  SKIN: Skin is warm and dry. No  rash noted. No cyanosis. No pallor. No jaundice HEAD: Normocephalic and atraumatic.  EYES: No scleral icterus MOUTH/THROAT: Moist oral membranes.  NECK: No JVD present. No thyromegaly noted. No carotid bruits  LYMPHATIC: No visible cervical adenopathy.  CHEST Normal respiratory effort. No intercostal retractions  LUNGS: Clear to auscultation bilaterally.  No stridor. No wheezes. No rales.  CARDIOVASCULAR: Regular rate and rhythm, positive S1-S2, no murmurs rubs or gallops appreciated. ABDOMINAL:  Soft, nontender, nondistended, positive bowel sounds all 4 quadrants.No apparent ascites.  EXTREMITIES: No peripheral edema, warm to touch, +2 DP and PT pulses HEMATOLOGIC: No significant bruising NEUROLOGIC: Oriented to person, place, and time. Nonfocal. Normal muscle tone.  PSYCHIATRIC: Normal mood and affect. Normal behavior. Cooperative  CARDIAC DATABASE: EKG: 04/30/2021: NSR, 72 bpm, normal axis, without underlying ischemia or injury pattern.  Echocardiogram: No results found for this or any previous visit from the past 1095 days.    Stress Testing: No results found for this or any previous visit from the past 1095  days.   Heart Catheterization: None  LABORATORY DATA: CBC Latest Ref Rng & Units 10/29/2020 10/28/2020 10/27/2020  WBC 4.0 - 10.5 K/uL 14.5(H) 15.6(H) 16.3(H)  Hemoglobin 12.0 - 15.0 g/dL 9.1(L) 9.7(L) 9.0(L)  Hematocrit 36.0 - 46.0 % 28.0(L) 29.4(L) 28.0(L)  Platelets 150 - 400 K/uL 482(H) 430(H) 350    CMP Latest Ref Rng & Units 10/28/2020 10/27/2020 10/26/2020  Glucose 70 - 99 mg/dL 98 97 108(H)  BUN 8 - 23 mg/dL 12 19 24(H)  Creatinine 0.44 - 1.00 mg/dL 1.19(H) 1.04(H) 1.21(H)  Sodium 135 - 145 mmol/L 135 136 135  Potassium 3.5 - 5.1 mmol/L 4.0 3.8 4.2  Chloride 98 - 111 mmol/L 104 107 105  CO2 22 - 32 mmol/L 25 19(L) 22  Calcium 8.9 - 10.3 mg/dL 9.0 8.8(L) 8.8(L)  Total Protein 6.5 - 8.1 g/dL - - -  Total Bilirubin 0.3 - 1.2 mg/dL - - -  Alkaline Phos 38 - 126  U/L - - -  AST 15 - 41 U/L - - -  ALT 0 - 44 U/L - - -    Lipid Panel  No results found for: CHOL, TRIG, HDL, CHOLHDL, VLDL, LDLCALC, LDLDIRECT, LABVLDL  No components found for: NTPROBNP No results for input(s): PROBNP in the last 8760 hours. No results for input(s): TSH in the last 8760 hours.  BMP Recent Labs    10/26/20 0509 10/27/20 0319 10/28/20 0356  NA 135 136 135  K 4.2 3.8 4.0  CL 105 107 104  CO2 22 19* 25  GLUCOSE 108* 97 98  BUN 24* 19 12  CREATININE 1.21* 1.04* 1.19*  CALCIUM 8.8* 8.8* 9.0  GFRNONAA 49* 59* 50*    HEMOGLOBIN A1C No results found for: HGBA1C, MPG  External Labs:  Date Collected: 01/21/2021 , information obtained by [facility]  Potassium: 4.6 Creatinine 0.94 mg/dL. eGFR: 67 mL/min per 1.73 m Hemoglobin: 11.4 g/dL and hematocrit: 35.3 % Lipid profile: Total cholesterol 171 , triglycerides 124 , HDL 51 , LDL 97 AST: 21 , ALT: 12 , alkaline phosphatase: 82    IMPRESSION:    ICD-10-CM   1. Precordial pain  R07.2 PCV CARDIAC STRESS TEST    PCV ECHOCARDIOGRAM COMPLETE    CT CARDIAC SCORING ([facility] LOCATIONS ONLY)    2. Family history of ischemic heart disease  Z82.49 EKG 12-Lead       RECOMMENDATIONS: Zaelyn Noack is a 65 y.o. African-American female who presents to the office for evaluation of precordial pain in the setting of family history of CAD.  Precordial pain Appears to be noncardiac. Given her risk factors that shared decision was to proceed with ischemic evaluation. Echocardiogram will be ordered to evaluate for structural heart disease and left ventricular systolic function. Exercise treadmill stress test to evaluate for functional status and exercise-induced ischemia. Coronary calcium score for risk stratification  As part of this initial consultation reviewed outside records provided by PCP which included office note,  labs these findings have been summarized and noted above for further reference.  Discussed disease  management, ordering diagnostic testing, coordination of care and patient education provided as a part of today's encounter.   FINAL MEDICATION LIST END OF ENCOUNTER: No orders of the defined types were placed in this encounter.   Medications Discontinued During This Encounter  Medication Reason   Camphor-Menthol-Methyl Sal (SALONPAS) 3.06-20-08 % [facility] Error   Cyanocobalamin (B-12 PO) Error   docusate sodium (COLACE) 100 MG capsule Error   methocarbamol (ROBAXIN) 500 MG tablet Error  pantoprazole (PROTONIX) 40 MG tablet Error   promethazine (PHENERGAN) 25 MG tablet Error   fexofenadine-pseudoephedrine (ALLEGRA-D 24) 180-240 MG 24 hr tablet Error     Current Outpatient Medications:    acetaminophen (TYLENOL) 650 MG CR tablet, Take 1,300 mg by mouth every 8 (eight) hours as needed for pain., Disp: , Rfl:    Cholecalciferol (D3 PO), Take 1 capsule by mouth daily., Disp: , Rfl:    fluticasone (FLONASE) 50 MCG/ACT nasal spray, Place 1-2 sprays into both nostrils daily as needed for allergies or rhinitis., Disp: , Rfl:    Multiple Vitamin (MULTIVITAMIN WITH MINERALS) TABS tablet, Take 1 tablet by mouth daily., Disp: , Rfl:    Multiple Vitamins-Minerals (HAIR/SKIN/NAILS/BIOTIN PO), Take 1 tablet by mouth 3 (three) times a week., Disp: , Rfl:    vitamin C (ASCORBIC ACID) 500 MG tablet, Take 1,000 mg by mouth daily., Disp: , Rfl:   Orders Placed This Encounter  Procedures   CT CARDIAC SCORING ([facility] LOCATIONS ONLY)   PCV CARDIAC STRESS TEST   EKG 12-Lead   PCV ECHOCARDIOGRAM COMPLETE    There are no Patient Instructions on file for this visit.   --Continue cardiac medications as reconciled in final medication list. --Return in about 7 weeks (around 06/18/2021) for Follow up, Chest pain, Review test results. Or sooner if needed. --Continue follow-up with your primary care physician regarding the management of your other chronic comorbid conditions.  Patient's questions and concerns were  addressed to her satisfaction. She voices understanding of the instructions provided during this encounter.   This note was created using a voice recognition software as a result there may be grammatical errors inadvertently enclosed that do not reflect the nature of this encounter. Every attempt is made to correct such errors.  Sandy Sutton, [place], [facility]  Pager: [phone] Office: [phone]

## 2021-04-30 ENCOUNTER — Encounter: Payer: Self-pay | Admitting: Cardiology

## 2021-04-30 ENCOUNTER — Ambulatory Visit: Payer: Medicare Other | Admitting: Cardiology

## 2021-04-30 ENCOUNTER — Other Ambulatory Visit: Payer: Self-pay

## 2021-04-30 VITALS — BP 109/71 | HR 84 | Resp 16 | Ht 62.0 in | Wt 168.6 lb

## 2021-04-30 DIAGNOSIS — Z8249 Family history of ischemic heart disease and other diseases of the circulatory system: Secondary | ICD-10-CM | POA: Diagnosis not present

## 2021-04-30 DIAGNOSIS — R072 Precordial pain: Secondary | ICD-10-CM | POA: Diagnosis not present

## 2021-05-26 ENCOUNTER — Ambulatory Visit
Admission: RE | Admit: 2021-05-26 | Discharge: 2021-05-26 | Disposition: A | Discharge status: Home / Self Care | Payer: No Typology Code available for payment source | Source: Ambulatory Visit | Attending: Cardiology | Admitting: Cardiology

## 2021-05-26 DIAGNOSIS — E785 Hyperlipidemia, unspecified: Secondary | ICD-10-CM | POA: Diagnosis not present

## 2021-05-26 DIAGNOSIS — Z8249 Family history of ischemic heart disease and other diseases of the circulatory system: Secondary | ICD-10-CM | POA: Diagnosis not present

## 2021-05-26 DIAGNOSIS — R072 Precordial pain: Secondary | ICD-10-CM

## 2021-06-26 ENCOUNTER — Ambulatory Visit: Payer: Medicare Other

## 2021-06-26 ENCOUNTER — Other Ambulatory Visit: Payer: Self-pay

## 2021-06-26 DIAGNOSIS — R072 Precordial pain: Secondary | ICD-10-CM

## 2021-07-04 ENCOUNTER — Other Ambulatory Visit: Payer: Self-pay

## 2021-07-04 ENCOUNTER — Ambulatory Visit: Payer: Medicare Other

## 2021-07-04 DIAGNOSIS — R072 Precordial pain: Secondary | ICD-10-CM

## 2021-07-14 ENCOUNTER — Other Ambulatory Visit: Payer: Self-pay

## 2021-07-14 ENCOUNTER — Ambulatory Visit: Payer: Medicare Other | Admitting: Cardiology

## 2021-07-14 ENCOUNTER — Encounter: Payer: Self-pay | Admitting: Cardiology

## 2021-07-14 VITALS — BP 112/67 | HR 86 | Temp 98.0°F | Resp 16 | Ht 62.0 in | Wt 165.8 lb

## 2021-07-14 DIAGNOSIS — Z712 Person consulting for explanation of examination or test findings: Secondary | ICD-10-CM

## 2021-07-14 DIAGNOSIS — R072 Precordial pain: Secondary | ICD-10-CM

## 2021-07-14 DIAGNOSIS — Z8249 Family history of ischemic heart disease and other diseases of the circulatory system: Secondary | ICD-10-CM

## 2021-07-14 NOTE — Progress Notes (Signed)
Date:  07/14/2021   ID:  [name], DOB 1956-02-18, MRN [id number]  PCP:  Lucianne Lei, MD  Cardiologist:  Rex Kras, DO, [facility]  (established care 04/30/2021)  Date: 07/14/21 Last Office Visit: 04/30/2021  Chief Complaint  Patient presents with   Chest Pain   Results   Follow-up    HPI  Sandy Sutton is a 66 y.o. African-American female who presents to the office with a chief complaint of " reevaluation of chest pain and discuss test results."   She is referred to the office at the request of Lucianne Lei, MD for evaluation of family history of ischemic heart disease .  Given her prior episodes of costochondritis with family history of premature CAD she was referred to cardiology for further evaluation and management.  Patient's precordial discomfort appear to be noncardiac however, due to multiple episodes of precordial discomfort and family history of premature CAD the shared decision was to proceed with ischemic work-up.  Echocardiogram noted preserved LVEF, exercise treadmill stress test low risk, and total coronary calcium score 0.  Clinically patient states her discomfort has improved but continues to be present intermittently, more prominent during the morning hours, worse when she lays on the right lateral decubitus position and improves when laying on the left.  The discomfort is not brought on by effort related activities and does not resolve with rest, self-limited.  No change in overall physical endurance.  FUNCTIONAL STATUS: Walks at least 3 miles on a daily basis.  And also participates in water activities in the pool.  ALLERGIES: Allergies  Allergen Reactions   Darvon Other (See Comments)    Hallucinations   Propoxyphene Other (See Comments)    Hallucinations   Erythromycin Nausea And Vomiting and Rash   Hydrocodone Nausea And Vomiting    MEDICATION LIST PRIOR TO VISIT: Current Meds  Medication Sig   acetaminophen (TYLENOL) 650 MG CR tablet  Take 1,300 mg by mouth every 8 (eight) hours as needed for pain.   Cholecalciferol (D3 PO) Take 1 capsule by mouth daily.   Cyanocobalamin (VITAMIN B 12) 500 MCG TABS Take by mouth.   fluticasone (FLONASE) 50 MCG/ACT nasal spray Place 1-2 sprays into both nostrils daily as needed for allergies or rhinitis.   Multiple Vitamin (MULTIVITAMIN WITH MINERALS) TABS tablet Take 1 tablet by mouth daily.   Multiple Vitamins-Minerals (HAIR/SKIN/NAILS/BIOTIN PO) Take 1 tablet by mouth 3 (three) times a week.   vitamin C (ASCORBIC ACID) 500 MG tablet Take 1,000 mg by mouth daily.     PAST MEDICAL HISTORY: Past Medical History:  Diagnosis Date   Arthritis    hands and feet   GERD (gastroesophageal reflux disease)    Gout yrs ago   hx of   History of bronchitis 2015   History of colon polyps benign   History of gastric ulcer yrs ago   History of migraine    Hypothyroidism    Joint pain    Joint swelling    bil knee and wrist   Lower back pain    Pneumonia last time 2019   Sepsis ([facility]) admit 10-24-2020 to 10-29-2020 at [facility]   with acute kidney injury   Urinary urgency    Weakness    right hand and pt states from pinched nerve   Wears glasses     PAST SURGICAL HISTORY: Past Surgical History:  Procedure Laterality Date   Colonosscopy  2016   CYSTOSCOPY W/ URETERAL STENT PLACEMENT Right 10/24/2020   Procedure: CYSTOSCOPY  WITH RETROGRADE PYELOGRAM/URETERAL STENT PLACEMENT;  Surgeon: Janith Lima, MD;  Location: [place];  Service: Urology;  Laterality: Right;   CYSTOSCOPY/URETEROSCOPY/HOLMIUM LASER/STENT PLACEMENT Right 11/15/2020   Procedure: CYSTOSCOPY/RETROGRADE/URETEROSCOPY/HOLMIUM LASER/, STONE BASKETRY ,STENT REPLACEMENT;  Surgeon: Janith Lima, MD;  Location: [facility];  Service: Urology;  Laterality: Right;   ESOPHAGOGASTRODUODENOSCOPY  yrs ago   HOLMIUM LASER APPLICATION Right 46/80/3212   Procedure: HOLMIUM LASER APPLICATION;  Surgeon: Janith Lima, MD;  Location:  [facility];  Service: Urology;  Laterality: Right;   KIDNEY STONE SURGERY     Kidney stone removal   KNEE ARTHROPLASTY Left 03/13/2015   Procedure: COMPUTER ASSISTED TOTAL KNEE ARTHROPLASTY; LEFT KNEE;  Surgeon: [name], MD;  Location: [place];  Service: Orthopedics;  Laterality: Left;   KNEE ARTHROSCOPY Left 2015   THYROID LOBECTOMY Right 08/19/2012   Procedure: THYROID LOBECTOMY;  Surgeon: [name], MD;  Location: [facility];  Service: General;  Laterality: Right;   TUBAL LIGATION  1995    FAMILY HISTORY: The patient family history includes Alzheimer's disease in her father; Breast cancer in her sister; Cancer in her brother and mother; Hypertension in her sister; Lupus in her sister.  SOCIAL HISTORY:  The patient  reports that she has never smoked. She has never used smokeless tobacco. She reports current alcohol use. She reports that she does not use drugs.  REVIEW OF SYSTEMS: Review of Systems  Constitutional: Negative for chills and fever.  HENT:  Negative for hoarse voice and nosebleeds.   Eyes:  Negative for discharge, double vision and pain.  Cardiovascular:  Positive for chest pain (non-cardiac). Negative for claudication, dyspnea on exertion, leg swelling, near-syncope, orthopnea, palpitations, paroxysmal nocturnal dyspnea and syncope.  Respiratory:  Negative for hemoptysis and shortness of breath.   Musculoskeletal:  Negative for muscle cramps and myalgias.  Gastrointestinal:  Negative for abdominal pain, constipation, diarrhea, hematemesis, hematochezia, melena, nausea and vomiting.  Neurological:  Negative for dizziness and light-headedness.   PHYSICAL EXAM: Vitals with BMI 07/14/2021 04/30/2021 11/15/2020  Height _0  _1  -  Weight 165 lbs 13 oz 168 lbs 10 oz -  BMI 24.82 50.03 -  Systolic 704 888 916  Diastolic 67 71 59  Pulse 86 84 92    CONSTITUTIONAL: Well-developed and well-nourished. No acute distress.  SKIN: Skin is warm and dry. No  rash noted. No cyanosis. No pallor. No jaundice HEAD: Normocephalic and atraumatic.  EYES: No scleral icterus MOUTH/THROAT: Moist oral membranes.  NECK: No JVD present. No thyromegaly noted. No carotid bruits  LYMPHATIC: No visible cervical adenopathy.  CHEST Normal respiratory effort. No intercostal retractions  LUNGS: Clear to auscultation bilaterally.  No stridor. No wheezes. No rales.  CARDIOVASCULAR: Regular rate and rhythm, positive S1-S2, no murmurs rubs or gallops appreciated. ABDOMINAL:  Soft, nontender, nondistended, positive bowel sounds all 4 quadrants.No apparent ascites.  EXTREMITIES: No peripheral edema, warm to touch, +2 DP and PT pulses HEMATOLOGIC: No significant bruising NEUROLOGIC: Oriented to person, place, and time. Nonfocal. Normal muscle tone.  PSYCHIATRIC: Normal mood and affect. Normal behavior. Cooperative  CARDIAC DATABASE: EKG: 04/30/2021: NSR, 72 bpm, normal axis, without underlying ischemia or injury pattern.  Echocardiogram: 06/26/2021: Normal LV systolic function with visual EF 60-65%. Left ventricle cavity is normal in size. Normal left ventricular wall thickness. Normal global wall motion. Normal diastolic filling pattern, normal LAP.  Mild tricuspid regurgitation. No evidence of pulmonary hypertension. Mild pulmonic regurgitation. No prior study for comparison.  Stress Testing: Exercise treadmill stress test 07/04/2021: Exercise treadmill stress test performed using Bruce protocol.  Patient reached 10.1 METS, and 102% of age predicted maximum heart rate.  Exercise capacity was good.  No chest pain reported.  Normal heart rate and hemodynamic response. Stress EKG revealed no ischemic changes. Low risk study.  Heart Catheterization: None  CAC Report 05/26/2021 CORONARY CALCIUM SCORES: Left Main: 0 LAD: 0 LCx: 0 RCA: 0 Total Agatston Score: 0 MESA database percentile: 0 AORTA MEASUREMENTS: Ascending Aorta: 30 mm Descending Aorta: 25  mm  LABORATORY DATA: CBC Latest Ref Rng & Units 10/29/2020 10/28/2020 10/27/2020  WBC 4.0 - 10.5 K/uL 14.5(H) 15.6(H) 16.3(H)  Hemoglobin 12.0 - 15.0 g/dL 9.1(L) 9.7(L) 9.0(L)  Hematocrit 36.0 - 46.0 % 28.0(L) 29.4(L) 28.0(L)  Platelets 150 - 400 K/uL 482(H) 430(H) 350    CMP Latest Ref Rng & Units 10/28/2020 10/27/2020 10/26/2020  Glucose 70 - 99 mg/dL 98 97 108(H)  BUN 8 - 23 mg/dL 12 19 24(H)  Creatinine 0.44 - 1.00 mg/dL 1.19(H) 1.04(H) 1.21(H)  Sodium 135 - 145 mmol/L 135 136 135  Potassium 3.5 - 5.1 mmol/L 4.0 3.8 4.2  Chloride 98 - 111 mmol/L 104 107 105  CO2 22 - 32 mmol/L 25 19(L) 22  Calcium 8.9 - 10.3 mg/dL 9.0 8.8(L) 8.8(L)  Total Protein 6.5 - 8.1 g/dL - - -  Total Bilirubin 0.3 - 1.2 mg/dL - - -  Alkaline Phos 38 - 126 U/L - - -  AST 15 - 41 U/L - - -  ALT 0 - 44 U/L - - -    Lipid Panel  No results found for: CHOL, TRIG, HDL, CHOLHDL, VLDL, LDLCALC, LDLDIRECT, LABVLDL  No components found for: NTPROBNP No results for input(s): PROBNP in the last 8760 hours. No results for input(s): TSH in the last 8760 hours.  BMP Recent Labs    10/26/20 0509 10/27/20 0319 10/28/20 0356  NA 135 136 135  K 4.2 3.8 4.0  CL 105 107 104  CO2 22 19* 25  GLUCOSE 108* 97 98  BUN 24* 19 12  CREATININE 1.21* 1.04* 1.19*  CALCIUM 8.8* 8.8* 9.0  GFRNONAA 49* 59* 50*    HEMOGLOBIN A1C No results found for: HGBA1C, MPG  External Labs:  Date Collected: 01/21/2021 , information obtained by [facility]  Potassium: 4.6 Creatinine 0.94 mg/dL. eGFR: 67 mL/min per 1.73 m Hemoglobin: 11.4 g/dL and hematocrit: 35.3 % Lipid profile: Total cholesterol 171 , triglycerides 124 , HDL 51 , LDL 97 AST: 21 , ALT: 12 , alkaline phosphatase: 82    IMPRESSION:    ICD-10-CM   1. Precordial pain  R07.2     2. Family history of ischemic heart disease  Z82.49     3. Encounter to discuss test results  Z71.2        RECOMMENDATIONS: Atasha Colebank is a 66 y.o. African-American female who  presents to the office for evaluation of precordial pain in the setting of family history of CAD.  Precordial pain Continues to have noncardiac discomfort, intermittently Total CAC: 0 Echo: Preserved LVEF, normal diastolic filling pattern, no significant valvular heart disease Treadmill stress test: Low risk study Educated on the importance of improving her modifiable cardiovascular risk factors for primary prevention. Recommended annual follow-up visit or sooner if change in clinical status. No additional testing warranted at this time from a cardiovascular standpoint. Will continue to monitor.   FINAL MEDICATION LIST END OF ENCOUNTER: No orders of the defined types were  placed in this encounter.    There are no discontinued medications.    Current Outpatient Medications:    acetaminophen (TYLENOL) 650 MG CR tablet, Take 1,300 mg by mouth every 8 (eight) hours as needed for pain., Disp: , Rfl:    Cholecalciferol (D3 PO), Take 1 capsule by mouth daily., Disp: , Rfl:    Cyanocobalamin (VITAMIN B 12) 500 MCG TABS, Take by mouth., Disp: , Rfl:    fluticasone (FLONASE) 50 MCG/ACT nasal spray, Place 1-2 sprays into both nostrils daily as needed for allergies or rhinitis., Disp: , Rfl:    Multiple Vitamin (MULTIVITAMIN WITH MINERALS) TABS tablet, Take 1 tablet by mouth daily., Disp: , Rfl:    Multiple Vitamins-Minerals (HAIR/SKIN/NAILS/BIOTIN PO), Take 1 tablet by mouth 3 (three) times a week., Disp: , Rfl:    vitamin C (ASCORBIC ACID) 500 MG tablet, Take 1,000 mg by mouth daily., Disp: , Rfl:   No orders of the defined types were placed in this encounter.   There are no Patient Instructions on file for this visit.   --Continue cardiac medications as reconciled in final medication list. --Return in about 1 year (around 07/14/2022) for Annual visit . Or sooner if needed. --Continue follow-up with your primary care physician regarding the management of your other chronic comorbid  conditions.  Patient's questions and concerns were addressed to her satisfaction. She voices understanding of the instructions provided during this encounter.   This note was created using a voice recognition software as a result there may be grammatical errors inadvertently enclosed that do not reflect the nature of this encounter. Every attempt is made to correct such errors.  Total time spent: 20 minutes  Mechele Claude [facility]  Pager: [phone] Office: [phone]

## 2022-01-13 ENCOUNTER — Emergency Department (HOSPITAL_COMMUNITY): Payer: Medicare Other

## 2022-01-13 ENCOUNTER — Other Ambulatory Visit: Payer: Self-pay

## 2022-01-13 ENCOUNTER — Encounter (HOSPITAL_COMMUNITY): Payer: Self-pay

## 2022-01-13 ENCOUNTER — Emergency Department (HOSPITAL_COMMUNITY)
Admission: EM | Admit: 2022-01-13 | Discharge: 2022-01-13 | Disposition: A | Discharge status: Home / Self Care | Payer: Medicare Other | Attending: Emergency Medicine | Admitting: Emergency Medicine

## 2022-01-13 DIAGNOSIS — R072 Precordial pain: Secondary | ICD-10-CM | POA: Diagnosis not present

## 2022-01-13 DIAGNOSIS — R079 Chest pain, unspecified: Secondary | ICD-10-CM | POA: Diagnosis present

## 2022-01-13 DIAGNOSIS — R42 Dizziness and giddiness: Secondary | ICD-10-CM | POA: Insufficient documentation

## 2022-01-13 DIAGNOSIS — R0602 Shortness of breath: Secondary | ICD-10-CM | POA: Diagnosis not present

## 2022-01-13 DIAGNOSIS — R0789 Other chest pain: Secondary | ICD-10-CM

## 2022-01-13 LAB — CBC WITH DIFFERENTIAL/PLATELET
Abs Immature Granulocytes: 0.03 10*3/uL (ref 0.00–0.07)
Basophils Absolute: 0.1 10*3/uL (ref 0.0–0.1)
Basophils Relative: 1 %
Eosinophils Absolute: 0.1 10*3/uL (ref 0.0–0.5)
Eosinophils Relative: 1 %
HCT: 38.2 % (ref 36.0–46.0)
Hemoglobin: 12.1 g/dL (ref 12.0–15.0)
Immature Granulocytes: 0 %
Lymphocytes Relative: 31 %
Lymphs Abs: 2.3 10*3/uL (ref 0.7–4.0)
MCH: 29.4 pg (ref 26.0–34.0)
MCHC: 31.7 g/dL (ref 30.0–36.0)
MCV: 92.9 fL (ref 80.0–100.0)
Monocytes Absolute: 0.6 10*3/uL (ref 0.1–1.0)
Monocytes Relative: 8 %
Neutro Abs: 4.3 10*3/uL (ref 1.7–7.7)
Neutrophils Relative %: 59 %
Platelets: 283 10*3/uL (ref 150–400)
RBC: 4.11 MIL/uL (ref 3.87–5.11)
RDW: 12.1 % (ref 11.5–15.5)
WBC: 7.3 10*3/uL (ref 4.0–10.5)
nRBC: 0 % (ref 0.0–0.2)

## 2022-01-13 LAB — COMPREHENSIVE METABOLIC PANEL
ALT: 19 U/L (ref 0–44)
AST: 21 U/L (ref 15–41)
Albumin: 4 g/dL (ref 3.5–5.0)
Alkaline Phosphatase: 88 U/L (ref 38–126)
Anion gap: 6 (ref 5–15)
BUN: 14 mg/dL (ref 8–23)
CO2: 24 mmol/L (ref 22–32)
Calcium: 9.5 mg/dL (ref 8.9–10.3)
Chloride: 108 mmol/L (ref 98–111)
Creatinine, Ser: 1.03 mg/dL — ABNORMAL HIGH (ref 0.44–1.00)
GFR, Estimated: 60 mL/min — ABNORMAL LOW (ref >60)
Glucose, Bld: 87 mg/dL (ref 70–99)
Potassium: 4.6 mmol/L (ref 3.5–5.1)
Sodium: 138 mmol/L (ref 135–145)
Total Bilirubin: 0.5 mg/dL (ref 0.3–1.2)
Total Protein: 7.7 g/dL (ref 6.5–8.1)

## 2022-01-13 LAB — TROPONIN I (HIGH SENSITIVITY)
Troponin I (High Sensitivity): 3 ng/L (ref <18)
Troponin I (High Sensitivity): 3 ng/L (ref <18)

## 2022-01-13 LAB — PROTIME-INR
INR: 1.1 (ref 0.8–1.2)
Prothrombin Time: 14.2 seconds (ref 11.4–15.2)

## 2022-01-13 MED ORDER — LACTATED RINGERS BOLUS PEDS
1000.0000 mL | Freq: Once | INTRAVENOUS | Status: AC
  Administered 2022-01-13: 1000 mL via INTRAVENOUS

## 2022-01-13 MED ORDER — IBUPROFEN 400 MG PO TABS
600.0000 mg | ORAL_TABLET | Freq: Once | ORAL | Status: AC
  Administered 2022-01-13: 600 mg via ORAL
  Filled 2022-01-13: qty 1

## 2022-01-13 NOTE — ED Provider Notes (Signed)
[facility] EMERGENCY DEPARTMENT Provider Note   CSN: [id number] Arrival date & time: 01/13/22  1245     History  Chief Complaint  Patient presents with   Chest Pain    Sandy Sutton is a 66 y.o. female.  Patient is a 66 year old female with a past medical history of costochondritis presenting to the emergency department with chest pain.  The patient states that she does get recurrent chest pain due to her costochondritis but states that today the chest pain was recurrent and associated with lightheadedness that prompted her to come to the emergency department.  She states that it initially started around 10:00 this morning while she was driving.  She states it feels like a sharp stabbing pain under her left breast.  She states she had associated shortness of breath.  She states that she pulled over to the car and drink some water and the dizziness improved.  She states that she then went to a friend's house and started to feel dizzy again.  She states that she rested for several minutes and called her doctor who recommended that she come to the emergency department.  She states that her chest pain and dizziness has continued to come and go.  She states that it does seem to get worse upon standing.  She denies any lower extremity swelling or history of blood clot, recent hospitalizations or surgeries, cancer history or hormone use.  She states that she did feel nauseous this morning with the chest pain but denies any recent vomiting or diarrhea and states that she has had a normal appetite.  She denies any blood pressure medication use.  The history is provided by the patient.  Chest Pain      Home Medications Prior to Admission medications   Medication Sig Start Date End Date Taking? Authorizing Provider  acetaminophen (TYLENOL) 650 MG CR tablet Take 1,300 mg by mouth every 8 (eight) hours as needed for pain.    [provider]  Cholecalciferol (D3 PO) Take  1 capsule by mouth daily.    [provider]  Cyanocobalamin (VITAMIN B 12) 500 MCG TABS Take by mouth.    [provider]  fluticasone (FLONASE) 50 MCG/ACT nasal spray Place 1-2 sprays into both nostrils daily as needed for allergies or rhinitis.    [provider]  Multiple Vitamin (MULTIVITAMIN WITH MINERALS) TABS tablet Take 1 tablet by mouth daily.    [provider]  Multiple Vitamins-Minerals (HAIR/SKIN/NAILS/BIOTIN PO) Take 1 tablet by mouth 3 (three) times a week.    [provider]  vitamin C (ASCORBIC ACID) 500 MG tablet Take 1,000 mg by mouth daily.    [provider]      Allergies    Darvon, Propoxyphene, Erythromycin, and Hydrocodone    Review of Systems   Review of Systems  Cardiovascular:  Positive for chest pain.    Physical Exam Updated Vital Signs BP 133/73   Pulse 85   Temp 97.8 F (36.6 C) (Oral)   Resp 18   SpO2 100%  Physical Exam Vitals and nursing note reviewed.  Constitutional:      Appearance: She is well-developed. She is obese.  HENT:     Head: Normocephalic and atraumatic.  Cardiovascular:     Rate and Rhythm: Normal rate and regular rhythm.     Pulses:          Radial pulses are 2+ on the right side and 2+ on the left side.  Dorsalis pedis pulses are 2+ on the right side and 2+ on the left side.     Heart sounds: Normal heart sounds.  Pulmonary:     Effort: Pulmonary effort is normal.     Breath sounds: Normal breath sounds.  Chest:     Chest wall: Tenderness (Left lower sternal border) present.  Abdominal:     Palpations: Abdomen is soft.  Musculoskeletal:        General: Normal range of motion.     Cervical back: Normal range of motion and neck supple.  Skin:    General: Skin is warm and dry.  Neurological:     General: No focal deficit present.     Mental Status: She is alert and oriented to person, place, and time.  Psychiatric:        Mood and Affect: Mood normal.         Behavior: Behavior normal.     ED Results / Procedures / Treatments   Labs (all labs ordered are listed, but only abnormal results are displayed) Labs Reviewed  COMPREHENSIVE METABOLIC PANEL - Abnormal; Notable for the following components:      Result Value   Creatinine, Ser 1.03 (*)    GFR, Estimated 60 (*)    All other components within normal limits  CBC WITH DIFFERENTIAL/PLATELET  PROTIME-INR  TROPONIN I (HIGH SENSITIVITY)  TROPONIN I (HIGH SENSITIVITY)    EKG EKG Interpretation  Date/Time:  Tuesday January 13 2022 12:51:27 EDT Ventricular Rate:  74 PR Interval:  158 QRS Duration: 78 QT Interval:  366 QTC Calculation: 406 R Axis:   31 Text Interpretation: Normal sinus rhythm Normal ECG When compared with ECG of 06-Mar-2015 12:51, PREVIOUS ECG IS PRESENT No significant change from prior EKG Confirmed by Oneal Deputy [phone]) on 01/13/2022 3:37:56 PM  Radiology DG Chest 2 View  Result Date: 01/13/2022 CLINICAL DATA:  Left-sided chest pain. EXAM: CHEST - 2 VIEW COMPARISON:  Chest x-ray dated March 06, 2015. FINDINGS: The heart size and mediastinal contours are within normal limits. Both lungs are clear. The visualized skeletal structures are unremarkable. IMPRESSION: No active cardiopulmonary disease. Electronically Signed   By: Titus Dubin M.D.   On: 01/13/2022 13:34    Procedures Procedures    Medications Ordered in ED Medications  lactated ringers bolus PEDS (has no administration in time range)  ibuprofen (ADVIL) tablet 600 mg (600 mg Oral Given 01/13/22 1616)    ED Course/ Medical Decision Making/ A&P Clinical Course as of 01/13/22 1631  Tue Jan 13, 2022  1631 Patient signed out to Dr. Nechama Guard pending repeat troponin and orthostatics, currently in stable condition. [VK]    Clinical Course User Index [VK] Ottie Glazier, DO                           Medical Decision Making Patient is a 66 year old female with a past medical history of  costochondritis presenting to the emergency department with chest pain and lightheadedness.  She was initially evaluated by triage provider and had labs and EKG performed.  Labs and EKG were reviewed and interpreted by myself.  EKG was without any acute ischemic changes or arrhythmia and initial troponin was negative making ACS less likely.  She does have a second troponin pending.  Her labs show no signs of anemia, electrolyte abnormality or dehydration as causes of her symptoms.  With her symptoms getting worse upon standing, concern for possible orthostatic hypotension and  will have orthostatic vitals performed.  Patient is low risk by Wells criteria making a PE unlikely.  The patient will be given Motrin for her chest pain.  Amount and/or Complexity of Data Reviewed External Data Reviewed: notes.    Details: Patient was follows with cardiology here and had work-up in January for her chest pain.  She had negative stress test and normal echo at that time and a negative cardiac calcium score, making ACS less likely           Final Clinical Impression(s) / ED Diagnoses Final diagnoses:  None    Rx / DC Orders ED Discharge Orders     None         Ottie Glazier, DO 01/13/22 1631

## 2022-01-13 NOTE — ED Provider Notes (Addendum)
I was notified by radiology that when patient went to stand to have chest x-ray performed had syncopal episode.  She did not hit the ground. Caught by rads tech  Reassessed patient.  She states "I do not know what happened."  She does not think she tripped.  No headache.  Heart and lungs clear, nonfocal neuro exam without deficits.  Nursing made aware needs room in back       Bronte Sabado A, PA-C 01/13/22 [place], [place], DO 01/13/22 1637

## 2022-01-13 NOTE — ED Notes (Signed)
Patient ambulated to the RR without incident.

## 2022-01-13 NOTE — ED Provider Triage Note (Signed)
Emergency Medicine Provider Triage Evaluation Note  Sandy Sutton , a 66 y.o. female  was evaluated in triage.  Pt complains of left-sided chest pain began around 10 AM this morning.  Located underneath left breast.  Associated shortness of breath, nausea, dizziness.  No back pain, numbness or weakness.  No lower extremity pain.  No history of PE or DVT.  Has never had anything like this previously  Seen by PCP, concern for ST changes on EKG with active chest pain sent here via POV by PCP  Review of Systems  Positive: CP, SOB Negative:   Physical Exam  There were no vitals taken for this visit. Gen:   Awake, no distress   Resp:  Normal effort  MSK:   Moves extremities without difficulty  Other:    Medical Decision Making  Medically screening exam initiated at 12:51 PM.  Appropriate orders placed.  Sandy Sutton was informed that the remainder of the evaluation will be completed by another provider, this initial triage assessment does not replace that evaluation, and the importance of remaining in the ED until their evaluation is complete.  CP, SOB   Sandy Sutton A, PA-C 01/13/22 1251

## 2022-01-13 NOTE — Discharge Instructions (Signed)
You were seen in the emergency department today for chest pain. You workup did not reveal a definite cause of your symptoms but was generally reassuring.   Return to the emergency department immediately if you develop recurrent, severe chest pain, shortness of breath, fainting spells, sudden sweatiness, or any other concerning symptoms.   Please also make an appointment to follow up with your primary care doctor or cardiologist within one week to assure improvement or resolution in symptoms. Further testing may be necessary, so it is extremely important to keep your follow-up appointment with your primary doctor.   

## 2022-01-13 NOTE — ED Provider Notes (Signed)
Patient with atypical chest pain with reassuring stress test and reassuring echocardiogram both in January 2023.  EKG normal sinus rhythm with T wave flattening aVL but no ischemic changes no arrhythmia.  Reassuring high since he troponin is 3 and 3.  Chest x-ray with no abnormalities.  No active chest pain at time of discharge.  Slightly elevated creatinine 1.03.  Given IV fluids.  Asymptomatic at time of discharge.  Safe to follow-up with cardiologist.  Strict return precaution discussed.  Safe for discharge.   Elgie Congo, MD 01/13/22 [phone]

## 2022-01-13 NOTE — ED Triage Notes (Signed)
Pt arrived POV from th clinic c/o left sided CP since 10am and also endorses nausea.

## 2022-01-26 ENCOUNTER — Encounter: Payer: Self-pay | Admitting: Cardiology

## 2022-01-26 ENCOUNTER — Ambulatory Visit: Payer: Medicare Other | Admitting: Cardiology

## 2022-01-26 VITALS — BP 115/65 | HR 88 | Temp 97.9°F | Resp 16 | Ht 62.0 in | Wt 170.1 lb

## 2022-01-26 DIAGNOSIS — R072 Precordial pain: Secondary | ICD-10-CM

## 2022-01-26 DIAGNOSIS — Z8249 Family history of ischemic heart disease and other diseases of the circulatory system: Secondary | ICD-10-CM

## 2022-01-26 DIAGNOSIS — Z09 Encounter for follow-up examination after completed treatment for conditions other than malignant neoplasm: Secondary | ICD-10-CM

## 2022-01-26 MED ORDER — METOPROLOL TARTRATE 25 MG PO TABS: 25.0000 mg | ORAL_TABLET | Freq: Two times a day (BID) | ORAL | 0 refills | Status: DC

## 2022-01-26 NOTE — Progress Notes (Signed)
Date:  01/26/2022   ID:  Sandy Sutton, DOB May 22, 1956, MRN [id number]  PCP:  Lucianne Lei, MD  Cardiologist:  Rex Kras, DO, [facility]  (established care 04/30/2021)  Date: 01/26/22 Last Office Visit: 07/14/2021  Chief Complaint  Patient presents with   Follow-up    ER visit   Chest Pain    HPI  Sandy Sutton is a 66 y.o. African-American female who presents to the office with a chief complaint of " chest pain and recent ER visit."   Patient was referred to the practice for evaluation of chest pain given her family history of heart disease.  The symptoms initially were noncardiac this shared decision was to proceed with additional work-up given her recurrent ER visits and family history of CAD.  Echocardiogram noted preserved LVEF, exercise treadmill stress test was low risk, and total coronary calcium score was 0.  She was asked to follow-up on an annual basis.  She presents today for sooner office visit as she had gone to the ED in August 2023 for chest pain evaluation.  High sensitive troponins were negative x3, EKG did not illustrate injury pattern, due to recent work-up was performed in January 2023 she was discharged home with close outpatient cardiology follow-up.  Patient continues to have precordial discomfort, sharp/nagging discomfort, below her left breast, travels posteriorly along the rib to the left shoulder, few minutes duration, not always brought on by effort related activities returns results of breast.  In addition she has noticed a significant reduced in physical endurance.  She used to walk 3 to 5 miles per day.  And now is down to 1 to 2 miles due to fatigue with prolonged ambulation.  FUNCTIONAL STATUS: Walks 1-2 miles on a daily basis.  And also participates in water activities in the pool.  ALLERGIES: Allergies  Allergen Reactions   Darvon Other (See Comments)    Hallucinations   Propoxyphene Other (See Comments)    Hallucinations   Erythromycin  Nausea And Vomiting and Rash   Hydrocodone Nausea And Vomiting    MEDICATION LIST PRIOR TO VISIT: Current Meds  Medication Sig   acetaminophen (TYLENOL) 650 MG CR tablet Take 1,300 mg by mouth every 8 (eight) hours as needed for pain.   Chlorphen-PE-Acetaminophen 4-10-325 MG TABS Take 1 tablet by mouth every other day.   Cholecalciferol (D3 PO) Take 1 capsule by mouth daily.   Cyanocobalamin (VITAMIN B 12) 500 MCG TABS Take by mouth.   metoprolol tartrate (LOPRESSOR) 25 MG tablet Take 1 tablet (25 mg total) by mouth 2 (two) times daily for 15 days.   Multiple Vitamin (MULTIVITAMIN WITH MINERALS) TABS tablet Take 1 tablet by mouth daily.   Multiple Vitamins-Minerals (HAIR/SKIN/NAILS/BIOTIN PO) Take 1 tablet by mouth 3 (three) times a week.     PAST MEDICAL HISTORY: Past Medical History:  Diagnosis Date   Arthritis    hands and feet   GERD (gastroesophageal reflux disease)    Gout yrs ago   hx of   History of bronchitis 2015   History of colon polyps benign   History of gastric ulcer yrs ago   History of migraine    Hypothyroidism    Joint pain    Joint swelling    bil knee and wrist   Lower back pain    Pneumonia last time 2019   Sepsis [facility]) admit 10-24-2020 to 10-29-2020 at [facility]   with acute kidney injury   Urinary urgency    Weakness  right hand and pt states from pinched nerve   Wears glasses     PAST SURGICAL HISTORY: Past Surgical History:  Procedure Laterality Date   Colonosscopy  2016   CYSTOSCOPY W/ URETERAL STENT PLACEMENT Right 10/24/2020   Procedure: CYSTOSCOPY WITH RETROGRADE PYELOGRAM/URETERAL STENT PLACEMENT;  Surgeon: Janith Lima, MD;  Location: [place];  Service: Urology;  Laterality: Right;   CYSTOSCOPY/URETEROSCOPY/HOLMIUM LASER/STENT PLACEMENT Right 11/15/2020   Procedure: CYSTOSCOPY/RETROGRADE/URETEROSCOPY/HOLMIUM LASER/, STONE BASKETRY ,STENT REPLACEMENT;  Surgeon: Janith Lima, MD;  Location: [facility];  Service: Urology;   Laterality: Right;   ESOPHAGOGASTRODUODENOSCOPY  yrs ago   HOLMIUM LASER APPLICATION Right 14/97/0263   Procedure: HOLMIUM LASER APPLICATION;  Surgeon: Janith Lima, MD;  Location: [facility];  Service: Urology;  Laterality: Right;   KIDNEY STONE SURGERY     Kidney stone removal   KNEE ARTHROPLASTY Left 03/13/2015   Procedure: COMPUTER ASSISTED TOTAL KNEE ARTHROPLASTY; LEFT KNEE;  Surgeon: [name], MD;  Location: [place];  Service: Orthopedics;  Laterality: Left;   KNEE ARTHROSCOPY Left 2015   THYROID LOBECTOMY Right 08/19/2012   Procedure: THYROID LOBECTOMY;  Surgeon: [name], MD;  Location: [facility];  Service: General;  Laterality: Right;   TUBAL LIGATION  1995    FAMILY HISTORY: The patient family history includes Alzheimer's disease in her father; Bladder Cancer in her brother; Breast cancer in her sister; Cancer in her brother and mother; Colon cancer in her brother; Heart attack in her father, paternal grandfather, and paternal grandmother; Heart disease in her brother, maternal aunt, paternal aunt, paternal aunt, and paternal aunt; Lupus in her sister.  SOCIAL HISTORY:  The patient  reports that she has never smoked. She has never used smokeless tobacco. She reports current alcohol use. She reports that she does not use drugs.  REVIEW OF SYSTEMS: Review of Systems  Constitutional: Positive for malaise/fatigue.  Cardiovascular:  Positive for chest pain. Negative for claudication, dyspnea on exertion, irregular heartbeat, leg swelling, near-syncope, orthopnea, palpitations, paroxysmal nocturnal dyspnea and syncope.  Respiratory:  Negative for shortness of breath.   Hematologic/Lymphatic: Negative for bleeding problem.  Musculoskeletal:  Negative for muscle cramps and myalgias.  Neurological:  Negative for dizziness and light-headedness.    PHYSICAL EXAM:    01/26/2022    2:17 PM 01/13/2022    7:30 PM 01/13/2022    7:18 PM  Vitals with BMI  Height 5' 2"      Weight 170 lbs 2 oz    BMI 78.5    Systolic 885 027 741  Diastolic 65 67 69  Pulse 88 66 80    Physical Exam  Constitutional: No distress.  Age appropriate, hemodynamically stable.   Neck: No JVD present.  Cardiovascular: Normal rate, regular rhythm, S1 normal, S2 normal, intact distal pulses and normal pulses. Exam reveals no gallop, no S3 and no S4.  No murmur heard. Pulses:      Dorsalis pedis pulses are 2+ on the right side and 2+ on the left side.       Posterior tibial pulses are 2+ on the right side and 2+ on the left side.  Pulmonary/Chest: Effort normal and breath sounds normal. No stridor. She has no wheezes. She has no rales.  Abdominal: Soft. Bowel sounds are normal. She exhibits no distension. There is no abdominal tenderness.  Musculoskeletal:        General: No edema.     Cervical back: Neck supple.  Neurological: She is alert  and oriented to person, place, and time. She has intact cranial nerves (2-12).  Skin: Skin is warm and moist.   CARDIAC DATABASE: EKG: 01/26/2022: Normal sinus rhythm, 85 bpm, without underlying ischemia or injury pattern.  Echocardiogram: 06/26/2021: Normal LV systolic function with visual EF 60-65%. Left ventricle cavity is normal in size. Normal left ventricular wall thickness. Normal global wall motion. Normal diastolic filling pattern, normal LAP.  Mild tricuspid regurgitation. No evidence of pulmonary hypertension. Mild pulmonic regurgitation. No prior study for comparison.   Stress Testing: Exercise treadmill stress test 07/04/2021: Exercise treadmill stress test performed using Bruce protocol.  Patient reached 10.1 METS, and 102% of age predicted maximum heart rate.  Exercise capacity was good.  No chest pain reported.  Normal heart rate and hemodynamic response. Stress EKG revealed no ischemic changes. Low risk study.  Heart Catheterization: None  CAC Report 05/26/2021 CORONARY CALCIUM SCORES: Left Main: 0 LAD: 0 LCx:  0 RCA: 0 Total Agatston Score: 0 MESA database percentile: 0 AORTA MEASUREMENTS: Ascending Aorta: 30 mm Descending Aorta: 25 mm  LABORATORY DATA:    Latest Ref Rng & Units 01/13/2022    1:09 PM 10/29/2020    1:57 AM 10/28/2020    3:56 AM  CBC  WBC 4.0 - 10.5 K/uL 7.3  14.5  15.6   Hemoglobin 12.0 - 15.0 g/dL 12.1  9.1  9.7   Hematocrit 36.0 - 46.0 % 38.2  28.0  29.4   Platelets 150 - 400 K/uL 283  482  430        Latest Ref Rng & Units 01/13/2022    1:09 PM 10/28/2020    3:56 AM 10/27/2020    3:19 AM  CMP  Glucose 70 - 99 mg/dL 87  98  97   BUN 8 - 23 mg/dL 14  12  19    Creatinine 0.44 - 1.00 mg/dL 1.03  1.19  1.04   Sodium 135 - 145 mmol/L 138  135  136   Potassium 3.5 - 5.1 mmol/L 4.6  4.0  3.8   Chloride 98 - 111 mmol/L 108  104  107   CO2 22 - 32 mmol/L 24  25  19    Calcium 8.9 - 10.3 mg/dL 9.5  9.0  8.8   Total Protein 6.5 - 8.1 g/dL 7.7     Total Bilirubin 0.3 - 1.2 mg/dL 0.5     Alkaline Phos 38 - 126 U/L 88     AST 15 - 41 U/L 21     ALT 0 - 44 U/L 19       Lipid Panel  No results found for: "CHOL", "TRIG", "HDL", "CHOLHDL", "VLDL", "LDLCALC", "LDLDIRECT", "LABVLDL"  No components found for: "NTPROBNP" No results for input(s): "PROBNP" in the last 8760 hours. No results for input(s): "TSH" in the last 8760 hours.  BMP Recent Labs    01/13/22 1309  NA 138  K 4.6  CL 108  CO2 24  GLUCOSE 87  BUN 14  CREATININE 1.03*  CALCIUM 9.5  GFRNONAA 60*    HEMOGLOBIN A1C No results found for: "HGBA1C", "MPG"  External Labs:  Date Collected: 01/21/2021 , information obtained by [facility]  Potassium: 4.6 Creatinine 0.94 mg/dL. eGFR: 67 mL/min per 1.73 m Hemoglobin: 11.4 g/dL and hematocrit: 35.3 % Lipid profile: Total cholesterol 171 , triglycerides 124 , HDL 51 , LDL 97 AST: 21 , ALT: 12 , alkaline phosphatase: 82    IMPRESSION:    ICD-10-CM   1. Precordial pain  R07.2 EKG 12-Lead    Lipid Panel With LDL/HDL Ratio    LDL cholesterol, direct     Lipoprotein A (LPA)    Apo A1 + B + Ratio    CMP14+EGFR    CT CORONARY MORPH W/CTA COR W/SCORE W/CA W/CM &/OR WO/CM    metoprolol tartrate (LOPRESSOR) 25 MG tablet    2. Family history of premature CAD  Z82.49 Lipid Panel With LDL/HDL Ratio    LDL cholesterol, direct    Lipoprotein A (LPA)    Apo A1 + B + Ratio    CMP14+EGFR    3. Hospital discharge follow-up  Z09        RECOMMENDATIONS: Glorene Leitzke is a 66 y.o. African-American female who presents to the office for evaluation of precordial pain in the setting of family history of CAD.  Patient's precordial pain appears to be noncardiac and in the past has undergone echo, GXT, and coronary calcium score.  Results reviewed as part of medical decision making at today's office visit and reassurance provided to the patient.  EKG today nonischemic.  However she continues to have recurrent precordial discomfort, and given the strong family history of premature CAD and reduced functional capacity since last office visit the shared decision was to proceed with coronary CTA to evaluate for obstructive CAD.  Patient is also requesting evaluating her LP(a) levels.  We will check fasting lipid profile, LP(a), apolipoprotein B was, and direct LDL.  Start Lopressor 25 mg p.o. twice daily a week prior to her coronary CTA.  Reemphasized importance of improving her modifiable cardiovascular risk factors.  As part of today's office visit independently reviewed ED records from her visit in August 2023, labs, and EKG.  Further recommendations will.  Educated on seeking medical attention sooner by going to the closest ER via EMS if the symptoms increase in intensity, frequency, duration, or has typical chest pain as discussed in the office.  Patient verbalized understanding.  FINAL MEDICATION LIST END OF ENCOUNTER: Meds ordered this encounter  Medications   metoprolol tartrate (LOPRESSOR) 25 MG tablet    Sig: Take 1 tablet (25 mg total) by  mouth 2 (two) times daily for 15 days.    Dispense:  30 tablet    Refill:  0     Medications Discontinued During This Encounter  Medication Reason   fluticasone (FLONASE) 50 MCG/ACT nasal spray    vitamin C (ASCORBIC ACID) 500 MG tablet       Current Outpatient Medications:    acetaminophen (TYLENOL) 650 MG CR tablet, Take 1,300 mg by mouth every 8 (eight) hours as needed for pain., Disp: , Rfl:    Chlorphen-PE-Acetaminophen 4-10-325 MG TABS, Take 1 tablet by mouth every other day., Disp: , Rfl:    Cholecalciferol (D3 PO), Take 1 capsule by mouth daily., Disp: , Rfl:    Cyanocobalamin (VITAMIN B 12) 500 MCG TABS, Take by mouth., Disp: , Rfl:    metoprolol tartrate (LOPRESSOR) 25 MG tablet, Take 1 tablet (25 mg total) by mouth 2 (two) times daily for 15 days., Disp: 30 tablet, Rfl: 0   Multiple Vitamin (MULTIVITAMIN WITH MINERALS) TABS tablet, Take 1 tablet by mouth daily., Disp: , Rfl:    Multiple Vitamins-Minerals (HAIR/SKIN/NAILS/BIOTIN PO), Take 1 tablet by mouth 3 (three) times a week., Disp: , Rfl:   Orders Placed This Encounter  Procedures   CT CORONARY MORPH W/CTA COR W/SCORE W/CA W/CM &/OR WO/CM   Lipid Panel With LDL/HDL Ratio   LDL cholesterol,  direct   Lipoprotein A (LPA)   Apo A1 + B + Ratio   CMP14+EGFR   EKG 12-Lead    There are no Patient Instructions on file for this visit.   --Continue cardiac medications as reconciled in final medication list. --Return in about 5 weeks (around 03/02/2022) for Reevaluation of, Chest pain, Review test results. Or sooner if needed. --Continue follow-up with your primary care physician regarding the management of your other chronic comorbid conditions.  Patient's questions and concerns were addressed to her satisfaction. She voices understanding of the instructions provided during this encounter.   This note was created using a voice recognition software as a result there may be grammatical errors inadvertently enclosed that do  not reflect the nature of this encounter. Every attempt is made to correct such errors.  Total time spent: 20 minutes  Mechele Claude [facility]  Pager: [phone] Office: [phone]

## 2022-02-03 ENCOUNTER — Other Ambulatory Visit: Payer: Self-pay | Admitting: Cardiology

## 2022-02-03 DIAGNOSIS — R072 Precordial pain: Secondary | ICD-10-CM

## 2022-02-10 LAB — LIPID PANEL WITH LDL/HDL RATIO
Cholesterol, Total: 183 mg/dL (ref 100–199)
HDL: 59 mg/dL (ref >39)
LDL Chol Calc (NIH): 112 mg/dL — ABNORMAL HIGH (ref 0–99)
LDL/HDL Ratio: 1.9 ratio (ref 0.0–3.2)
Triglycerides: 62 mg/dL (ref 0–149)
VLDL Cholesterol Cal: 12 mg/dL (ref 5–40)

## 2022-02-10 LAB — APO A1 + B + RATIO
Apolipo. B/A-1 Ratio: 0.5 ratio (ref 0.0–0.6)
Apolipoprotein A-1: 152 mg/dL (ref 116–209)
Apolipoprotein B: 77 mg/dL (ref <90)

## 2022-02-10 LAB — CMP14+EGFR
ALT: 17 IU/L (ref 0–32)
AST: 22 IU/L (ref 0–40)
Albumin/Globulin Ratio: 1.6 (ref 1.2–2.2)
Albumin: 4.7 g/dL (ref 3.9–4.9)
Alkaline Phosphatase: 102 IU/L (ref 44–121)
BUN/Creatinine Ratio: 15 (ref 12–28)
BUN: 15 mg/dL (ref 8–27)
Bilirubin Total: 0.3 mg/dL (ref 0.0–1.2)
CO2: 22 mmol/L (ref 20–29)
Calcium: 9.6 mg/dL (ref 8.7–10.3)
Chloride: 102 mmol/L (ref 96–106)
Creatinine, Ser: 0.98 mg/dL (ref 0.57–1.00)
Globulin, Total: 2.9 g/dL (ref 1.5–4.5)
Glucose: 89 mg/dL (ref 70–99)
Potassium: 4.9 mmol/L (ref 3.5–5.2)
Sodium: 139 mmol/L (ref 134–144)
Total Protein: 7.6 g/dL (ref 6.0–8.5)
eGFR: 63 mL/min/{1.73_m2} (ref >59)

## 2022-02-10 LAB — LIPOPROTEIN A (LPA): Lipoprotein (a): 44.5 nmol/L (ref <75.0)

## 2022-02-10 LAB — LDL CHOLESTEROL, DIRECT: LDL Direct: 108 mg/dL — ABNORMAL HIGH (ref 0–99)

## 2022-03-02 ENCOUNTER — Ambulatory Visit: Payer: Medicare Other | Admitting: Cardiology

## 2022-03-03 ENCOUNTER — Telehealth (HOSPITAL_COMMUNITY): Payer: Self-pay | Admitting: Emergency Medicine

## 2022-03-03 NOTE — Telephone Encounter (Signed)
Attempted to call patient regarding upcoming cardiac CT appointment. °Left message on voicemail with name and callback number °Zenas Santa RN Navigator Cardiac Imaging °[facility] °[phone] Office °[phone] Cell ° °

## 2022-03-04 ENCOUNTER — Ambulatory Visit (HOSPITAL_COMMUNITY)
Admission: RE | Admit: 2022-03-04 | Discharge: 2022-03-04 | Disposition: A | Discharge status: Home / Self Care | Payer: Medicare Other | Source: Ambulatory Visit | Attending: Cardiology | Admitting: Cardiology

## 2022-03-04 DIAGNOSIS — R072 Precordial pain: Secondary | ICD-10-CM | POA: Diagnosis present

## 2022-03-04 MED ORDER — NITROGLYCERIN 0.4 MG SL SUBL
0.8000 mg | SUBLINGUAL_TABLET | Freq: Once | SUBLINGUAL | Status: AC
  Administered 2022-03-04: 0.8 mg via SUBLINGUAL

## 2022-03-04 MED ORDER — NITROGLYCERIN 0.4 MG SL SUBL
SUBLINGUAL_TABLET | SUBLINGUAL | Status: AC
  Filled 2022-03-04: qty 2

## 2022-03-04 MED ORDER — IOHEXOL 350 MG/ML SOLN
95.0000 mL | Freq: Once | INTRAVENOUS | Status: AC | PRN
  Administered 2022-03-04: 95 mL via INTRAVENOUS

## 2022-03-06 ENCOUNTER — Encounter: Payer: Self-pay | Admitting: Cardiology

## 2022-03-06 ENCOUNTER — Ambulatory Visit: Payer: Medicare Other | Admitting: Cardiology

## 2022-03-06 VITALS — BP 119/74 | HR 96 | Temp 97.9°F | Resp 16 | Ht 62.0 in | Wt 177.6 lb

## 2022-03-06 DIAGNOSIS — R072 Precordial pain: Secondary | ICD-10-CM

## 2022-03-06 DIAGNOSIS — Z8249 Family history of ischemic heart disease and other diseases of the circulatory system: Secondary | ICD-10-CM

## 2022-03-06 DIAGNOSIS — Z712 Person consulting for explanation of examination or test findings: Secondary | ICD-10-CM

## 2022-03-06 NOTE — Progress Notes (Signed)
ID:  Sandy Sutton, DOB 1956-05-13, MRN [id number]  PCP:  Lucianne Lei, MD  Cardiologist:  Rex Kras, DO, [facility]  (established care 04/30/2021)  Date: 03/06/22 Last Office Visit: 01/26/2022  Chief Complaint  Patient presents with   Follow-up    Reevaluation of chest pain and discuss test results    HPI  Sandy Sutton is a 66 y.o. African-American female who presents to the office with a chief complaint of " reevaluation of chest pain and discuss test results."  Patient was referred to the practice for evaluation of chest pain.  Her symptoms are predominantly noncardiac but given the reoccurrence and ER visits this her decision was to proceed with ischemic work-up.  She underwent an echocardiogram which noted a preserved LVEF, GXT was reported to be low risk, and total coronary calcium score was 0.  She was reassured that her discomfort was likely noncardiac.  Since then she has had ER visits for similar concerns and her high sensitive troponins have been negative in the past.  EKG did not illustrate any myocardial injury pattern.  Due to recurrent ER visits shared decision was to proceed with coronary CTA.  Since last office visit she has not had any anginal discomfort.  Her last chest pain was approximately a week ago.  She is walking 3 miles per day and doing aerobic exercise as well.   FUNCTIONAL STATUS: Walks 1-2 miles on a daily basis.  And also participates in water activities in the pool.  ALLERGIES: Allergies  Allergen Reactions   Darvon Other (See Comments)    Hallucinations   Propoxyphene Other (See Comments)    Hallucinations   Erythromycin Nausea And Vomiting and Rash   Hydrocodone Nausea And Vomiting    MEDICATION LIST PRIOR TO VISIT: Current Meds  Medication Sig   acetaminophen (TYLENOL) 650 MG CR tablet Take 1,300 mg by mouth every 8 (eight) hours as needed for pain.   Chlorphen-PE-Acetaminophen 4-10-325 MG TABS Take 1 tablet by mouth every other day.    Cholecalciferol (D3 PO) Take 1 capsule by mouth daily.   Multiple Vitamin (MULTIVITAMIN WITH MINERALS) TABS tablet Take 1 tablet by mouth daily.   Multiple Vitamins-Minerals (HAIR/SKIN/NAILS/BIOTIN PO) Take 1 tablet by mouth 3 (three) times a week.     PAST MEDICAL HISTORY: Past Medical History:  Diagnosis Date   Arthritis    hands and feet   GERD (gastroesophageal reflux disease)    Gout yrs ago   hx of   History of bronchitis 2015   History of colon polyps benign   History of gastric ulcer yrs ago   History of migraine    Hypothyroidism    Joint pain    Joint swelling    bil knee and wrist   Lower back pain    Pneumonia last time 2019   Sepsis ([place]) admit 10-24-2020 to 10-29-2020 at [facility]   with acute kidney injury   Urinary urgency    Weakness    right hand and pt states from pinched nerve   Wears glasses     PAST SURGICAL HISTORY: Past Surgical History:  Procedure Laterality Date   Colonosscopy  2016   CYSTOSCOPY W/ URETERAL STENT PLACEMENT Right 10/24/2020   Procedure: CYSTOSCOPY WITH RETROGRADE PYELOGRAM/URETERAL STENT PLACEMENT;  Surgeon: Janith Lima, MD;  Location: [facility];  Service: Urology;  Laterality: Right;   CYSTOSCOPY/URETEROSCOPY/HOLMIUM LASER/STENT PLACEMENT Right 11/15/2020   Procedure: CYSTOSCOPY/RETROGRADE/URETEROSCOPY/HOLMIUM LASER/, STONE BASKETRY ,STENT REPLACEMENT;  Surgeon: Janith Lima, MD;  Location: [facility];  Service: Urology;  Laterality: Right;   ESOPHAGOGASTRODUODENOSCOPY  yrs ago   HOLMIUM LASER APPLICATION Right 13/24/4010   Procedure: HOLMIUM LASER APPLICATION;  Surgeon: Janith Lima, MD;  Location: [facility];  Service: Urology;  Laterality: Right;   KIDNEY STONE SURGERY     Kidney stone removal   KNEE ARTHROPLASTY Left 03/13/2015   Procedure: COMPUTER ASSISTED TOTAL KNEE ARTHROPLASTY; LEFT KNEE;  Surgeon: [name], MD;  Location: [facility];  Service: Orthopedics;  Laterality: Left;   KNEE  ARTHROSCOPY Left 2015   THYROID LOBECTOMY Right 08/19/2012   Procedure: THYROID LOBECTOMY;  Surgeon: [name], MD;  Location: [facility];  Service: General;  Laterality: Right;   TUBAL LIGATION  1995    FAMILY HISTORY: The patient family history includes Alzheimer's disease in her father; Bladder Cancer in her brother; Breast cancer in her sister; Cancer in her brother and mother; Colon cancer in her brother; Heart attack in her father, paternal grandfather, and paternal grandmother; Heart disease in her brother, maternal aunt, paternal aunt, paternal aunt, and paternal aunt; Lupus in her sister.  SOCIAL HISTORY:  The patient  reports that she has never smoked. She has never used smokeless tobacco. She reports current alcohol use. She reports that she does not use drugs.  REVIEW OF SYSTEMS: Review of Systems  Cardiovascular:  Negative for chest pain, claudication, dyspnea on exertion, irregular heartbeat, leg swelling, near-syncope, orthopnea, palpitations, paroxysmal nocturnal dyspnea and syncope.  Respiratory:  Negative for shortness of breath.   Hematologic/Lymphatic: Negative for bleeding problem.  Musculoskeletal:  Negative for muscle cramps and myalgias.  Neurological:  Negative for dizziness and light-headedness.    PHYSICAL EXAM:    03/06/2022   11:59 AM 03/04/2022   10:11 AM 03/04/2022    9:19 AM  Vitals with BMI  Height 5' 2"     Weight 177 lbs 10 oz    BMI 27.25    Systolic 366 440 347  Diastolic 74 62 57  Pulse 96  64    Physical Exam  Constitutional: No distress.  Age appropriate, hemodynamically stable.   Neck: No JVD present.  Cardiovascular: Normal rate, regular rhythm, S1 normal, S2 normal, intact distal pulses and normal pulses. Exam reveals no gallop, no S3 and no S4.  No murmur heard. Pulses:      Dorsalis pedis pulses are 2+ on the right side and 2+ on the left side.       Posterior tibial pulses are 2+ on the right side and 2+ on the left side.   Pulmonary/Chest: Effort normal and breath sounds normal. No stridor. She has no wheezes. She has no rales.  Abdominal: Soft. Bowel sounds are normal. She exhibits no distension. There is no abdominal tenderness.  Musculoskeletal:        General: No edema.     Cervical back: Neck supple.  Neurological: She is alert and oriented to person, place, and time. She has intact cranial nerves (2-12).  Skin: Skin is warm and moist.   CARDIAC DATABASE: EKG: 01/26/2022: Normal sinus rhythm, 85 bpm, without underlying ischemia or injury pattern.  Echocardiogram: 06/26/2021: Normal LV systolic function with visual EF 60-65%. Left ventricle cavity is normal in size. Normal left ventricular wall thickness. Normal global wall motion. Normal diastolic filling pattern, normal LAP.  Mild tricuspid regurgitation. No evidence of pulmonary hypertension. Mild pulmonic regurgitation. No prior study for comparison.   Stress Testing: Exercise treadmill stress test 07/04/2021: Exercise  treadmill stress test performed using Bruce protocol.  Patient reached 10.1 METS, and 102% of age predicted maximum heart rate.  Exercise capacity was good.  No chest pain reported.  Normal heart rate and hemodynamic response. Stress EKG revealed no ischemic changes. Low risk study.  Heart Catheterization: None  CAC Report 05/26/2021 CORONARY CALCIUM SCORES: Left Main: 0 LAD: 0 LCx: 0 RCA: 0 Total Agatston Score: 0 MESA database percentile: 0 AORTA MEASUREMENTS: Ascending Aorta: 30 mm Descending Aorta: 25 mm  CCTA: 03/05/2022: Total coronary calcium score of 0.  Normal coronary origin with right dominance. CAD-RADS = 0.  No evidence of CAD (0%). No acute or significant extracardiac abnormality.  LABORATORY DATA:    Latest Ref Rng & Units 01/13/2022    1:09 PM 10/29/2020    1:57 AM 10/28/2020    3:56 AM  CBC  WBC 4.0 - 10.5 K/uL 7.3  14.5  15.6   Hemoglobin 12.0 - 15.0 g/dL 12.1  9.1  9.7   Hematocrit 36.0 - 46.0  % 38.2  28.0  29.4   Platelets 150 - 400 K/uL 283  482  430        Latest Ref Rng & Units 02/09/2022   11:29 AM 01/13/2022    1:09 PM 10/28/2020    3:56 AM  CMP  Glucose 70 - 99 mg/dL 89  87  98   BUN 8 - 27 mg/dL 15  14  12    Creatinine 0.57 - 1.00 mg/dL 0.98  1.03  1.19   Sodium 134 - 144 mmol/L 139  138  135   Potassium 3.5 - 5.2 mmol/L 4.9  4.6  4.0   Chloride 96 - 106 mmol/L 102  108  104   CO2 20 - 29 mmol/L 22  24  25    Calcium 8.7 - 10.3 mg/dL 9.6  9.5  9.0   Total Protein 6.0 - 8.5 g/dL 7.6  7.7    Total Bilirubin 0.0 - 1.2 mg/dL 0.3  0.5    Alkaline Phos 44 - 121 IU/L 102  88    AST 0 - 40 IU/L 22  21    ALT 0 - 32 IU/L 17  19      Lipid Panel     Component Value Date/Time   CHOL 183 02/09/2022 1129   TRIG 62 02/09/2022 1129   HDL 59 02/09/2022 1129   LDLCALC 112 (H) 02/09/2022 1129   LDLDIRECT 108 (H) 02/09/2022 1129   LABVLDL 12 02/09/2022 1129    No components found for: "NTPROBNP" No results for input(s): "PROBNP" in the last 8760 hours. No results for input(s): "TSH" in the last 8760 hours.  BMP Recent Labs    01/13/22 1309 02/09/22 1129  NA 138 139  K 4.6 4.9  CL 108 102  CO2 24 22  GLUCOSE 87 89  BUN 14 15  CREATININE 1.03* 0.98  CALCIUM 9.5 9.6  GFRNONAA 60*  --     HEMOGLOBIN A1C No results found for: "HGBA1C", "MPG"  External Labs:  Date Collected: 01/21/2021 , information obtained by [facility]  Potassium: 4.6 Creatinine 0.94 mg/dL. eGFR: 67 mL/min per 1.73 m Hemoglobin: 11.4 g/dL and hematocrit: 35.3 % Lipid profile: Total cholesterol 171 , triglycerides 124 , HDL 51 , LDL 97 AST: 21 , ALT: 12 , alkaline phosphatase: 82    IMPRESSION:    ICD-10-CM   1. Precordial pain  R07.2     2. Family history of premature CAD  Z40.49  3. Encounter to discuss test results  Z71.2         RECOMMENDATIONS: Jannae Fagerstrom is a 66 y.o. African-American female who presents to the office for evaluation of precordial pain in the setting  of family history of CAD.  With regards for precordial discomfort she has undergone a very thorough cardiovascular work-up as outlined above.  She has never had anginal discomfort but given her recurrent precordial pain and ER visits this shared decision was to proceed with coronary CTA at the last office visit.  Patient's total coronary calcium score is 0 and no epicardial coronary artery disease as per the recent coronary CTA.  Patient is educated on the importance of improving her modifiable cardiovascular risk factors and moderate intensity exercise 30 minutes a day 5 days a week as tolerated.  At her request I also checked her LP(a) levels which are within normal limits and so are the apolipoprotein's.  I will defer her back to her PCP for further evaluation of noncardiac causes of her precordial pain.  Patient is asked to follow-up on as-needed basis or sooner if change in clinical status.  FINAL MEDICATION LIST END OF ENCOUNTER: No orders of the defined types were placed in this encounter.    Medications Discontinued During This Encounter  Medication Reason   metoprolol tartrate (LOPRESSOR) 25 MG tablet       Current Outpatient Medications:    acetaminophen (TYLENOL) 650 MG CR tablet, Take 1,300 mg by mouth every 8 (eight) hours as needed for pain., Disp: , Rfl:    Chlorphen-PE-Acetaminophen 4-10-325 MG TABS, Take 1 tablet by mouth every other day., Disp: , Rfl:    Cholecalciferol (D3 PO), Take 1 capsule by mouth daily., Disp: , Rfl:    Multiple Vitamin (MULTIVITAMIN WITH MINERALS) TABS tablet, Take 1 tablet by mouth daily., Disp: , Rfl:    Multiple Vitamins-Minerals (HAIR/SKIN/NAILS/BIOTIN PO), Take 1 tablet by mouth 3 (three) times a week., Disp: , Rfl:    Cyanocobalamin (VITAMIN B 12) 500 MCG TABS, Take by mouth. (Patient not taking: Reported on 03/06/2022), Disp: , Rfl:   No orders of the defined types were placed in this encounter.   There are no Patient Instructions on  file for this visit.   --Continue cardiac medications as reconciled in final medication list. --Return if symptoms worsen or fail to improve. Or sooner if needed. --Continue follow-up with your primary care physician regarding the management of your other chronic comorbid conditions.  Patient's questions and concerns were addressed to her satisfaction. She voices understanding of the instructions provided during this encounter.   This note was created using a voice recognition software as a result there may be grammatical errors inadvertently enclosed that do not reflect the nature of this encounter. Every attempt is made to correct such errors.   Rex Kras, [place], [facility]  Pager: [phone] Office: [phone]

## 2022-06-30 IMAGING — CR DG LUMBAR SPINE COMPLETE 4+V
5 series · 5 of 5 positions shown · non-contrast
Comparison: Lumbar spine radiograph dated 01/18/2014.

CLINICAL DATA: Back pain.

EXAM:
LUMBAR SPINE - COMPLETE 4+ VIEW

[w lumbar spine ap]
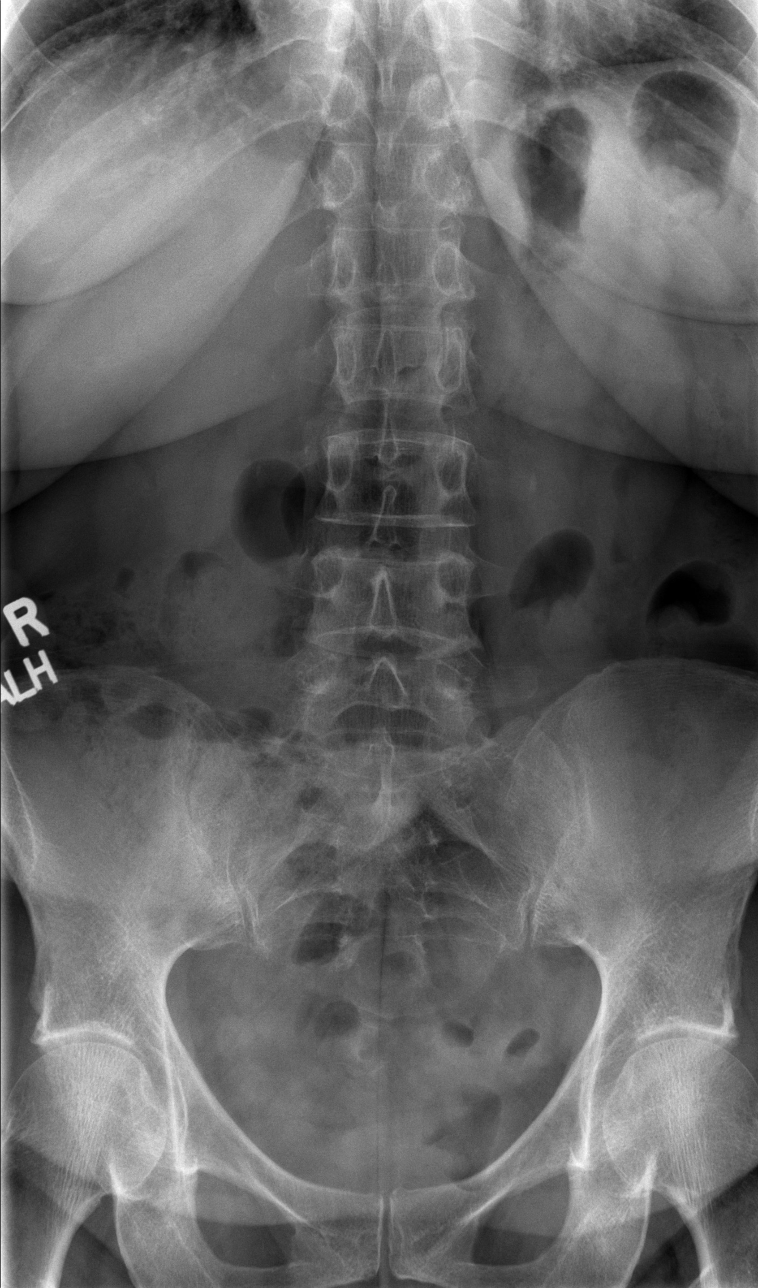

[w lumbar spine obl (1 of 2)]
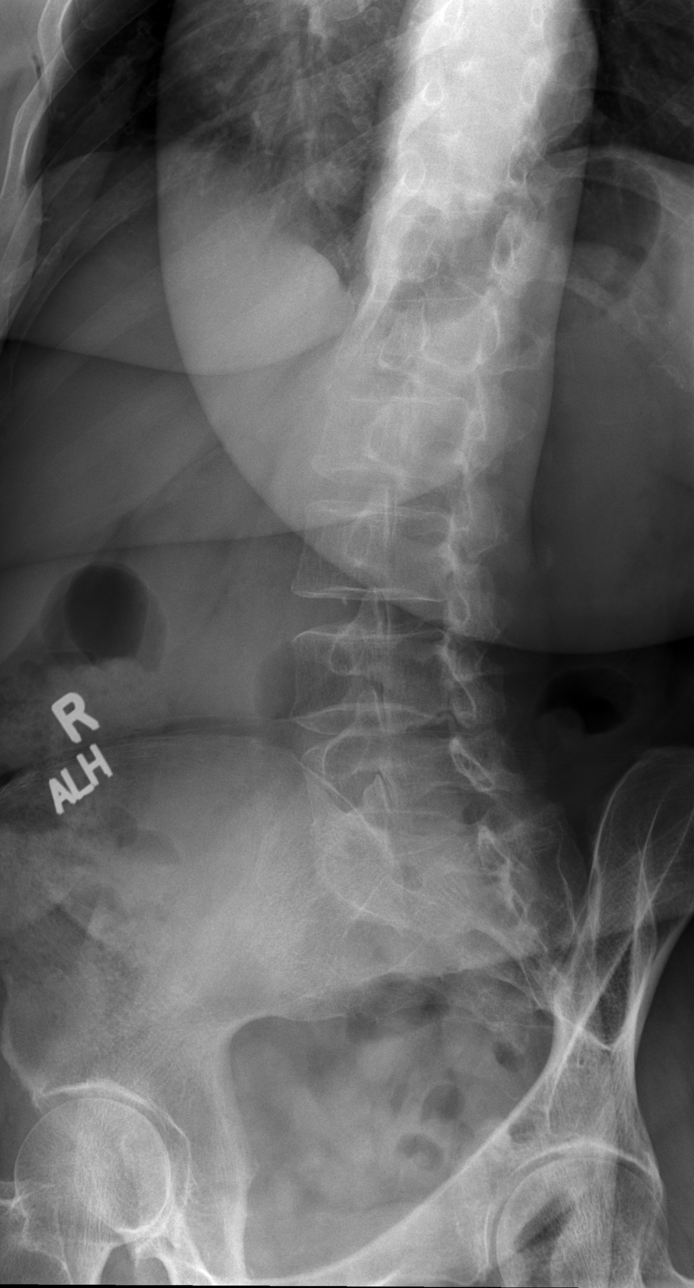

[w lumbar spine obl (2 of 2)]
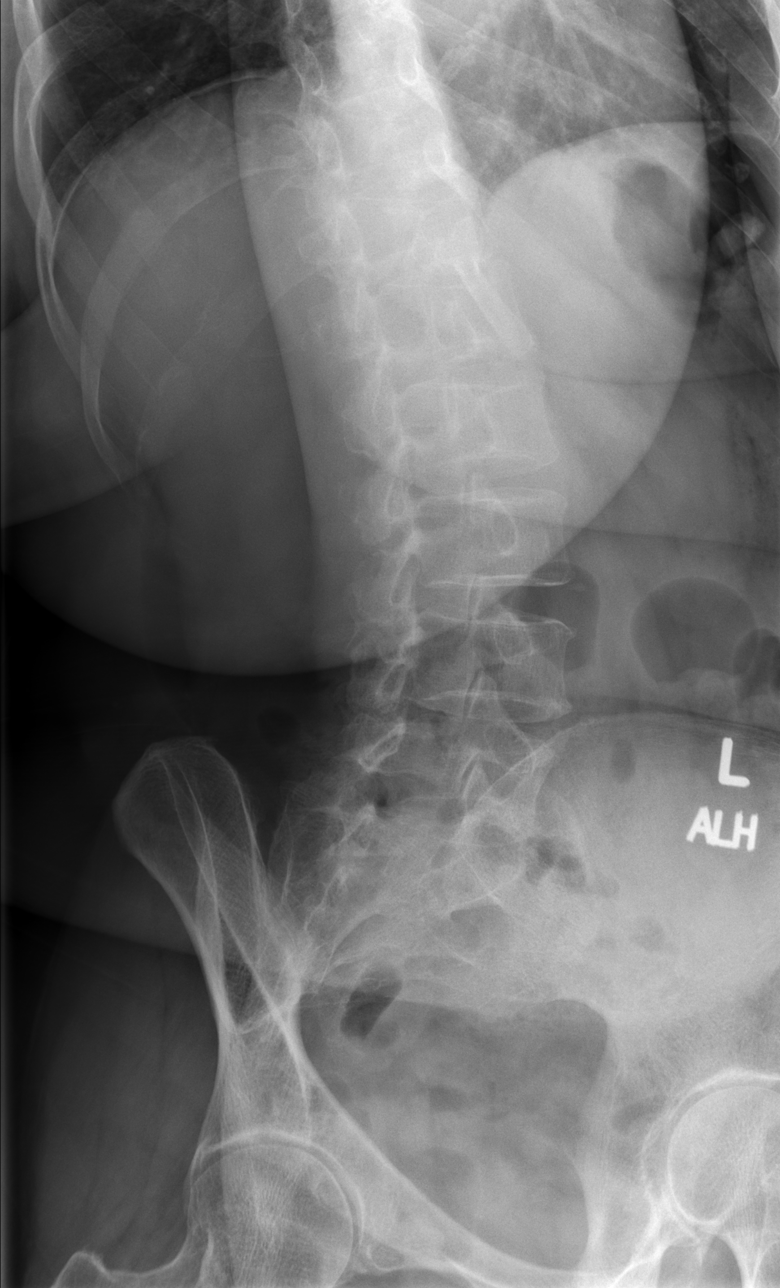

[w lumbar spine lat]
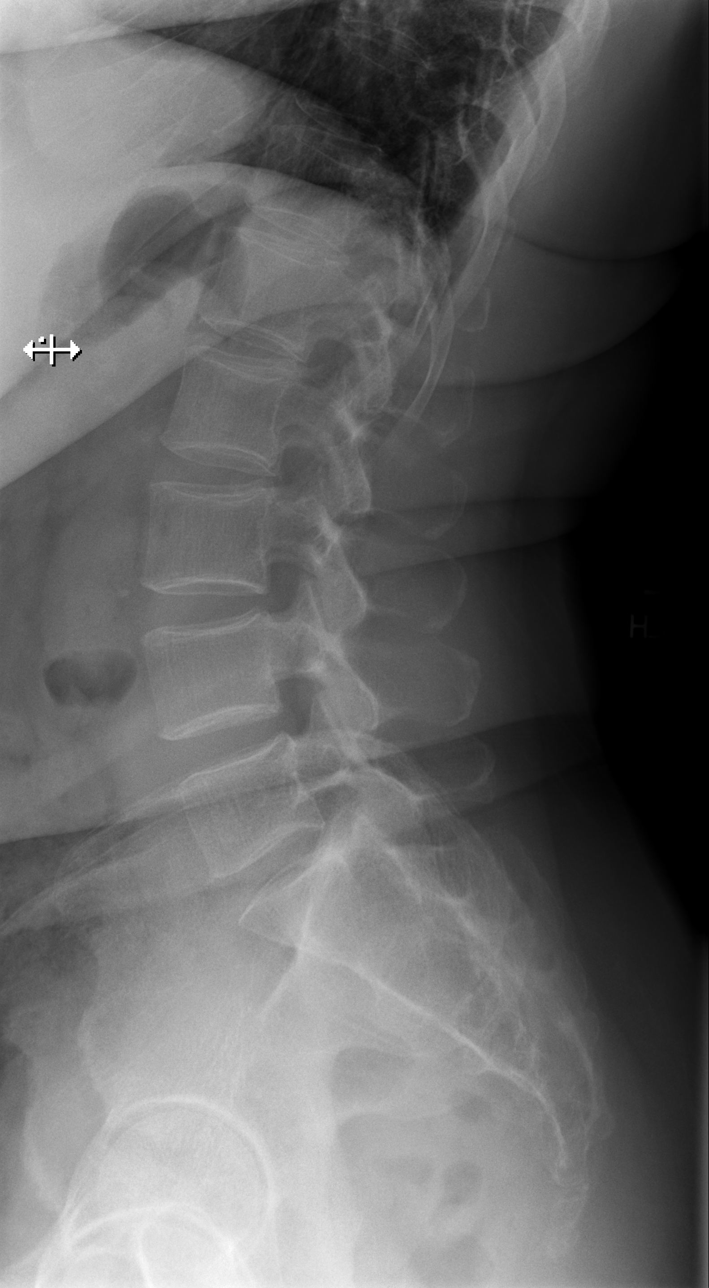

[w lumbar l-5 s-1 spot]
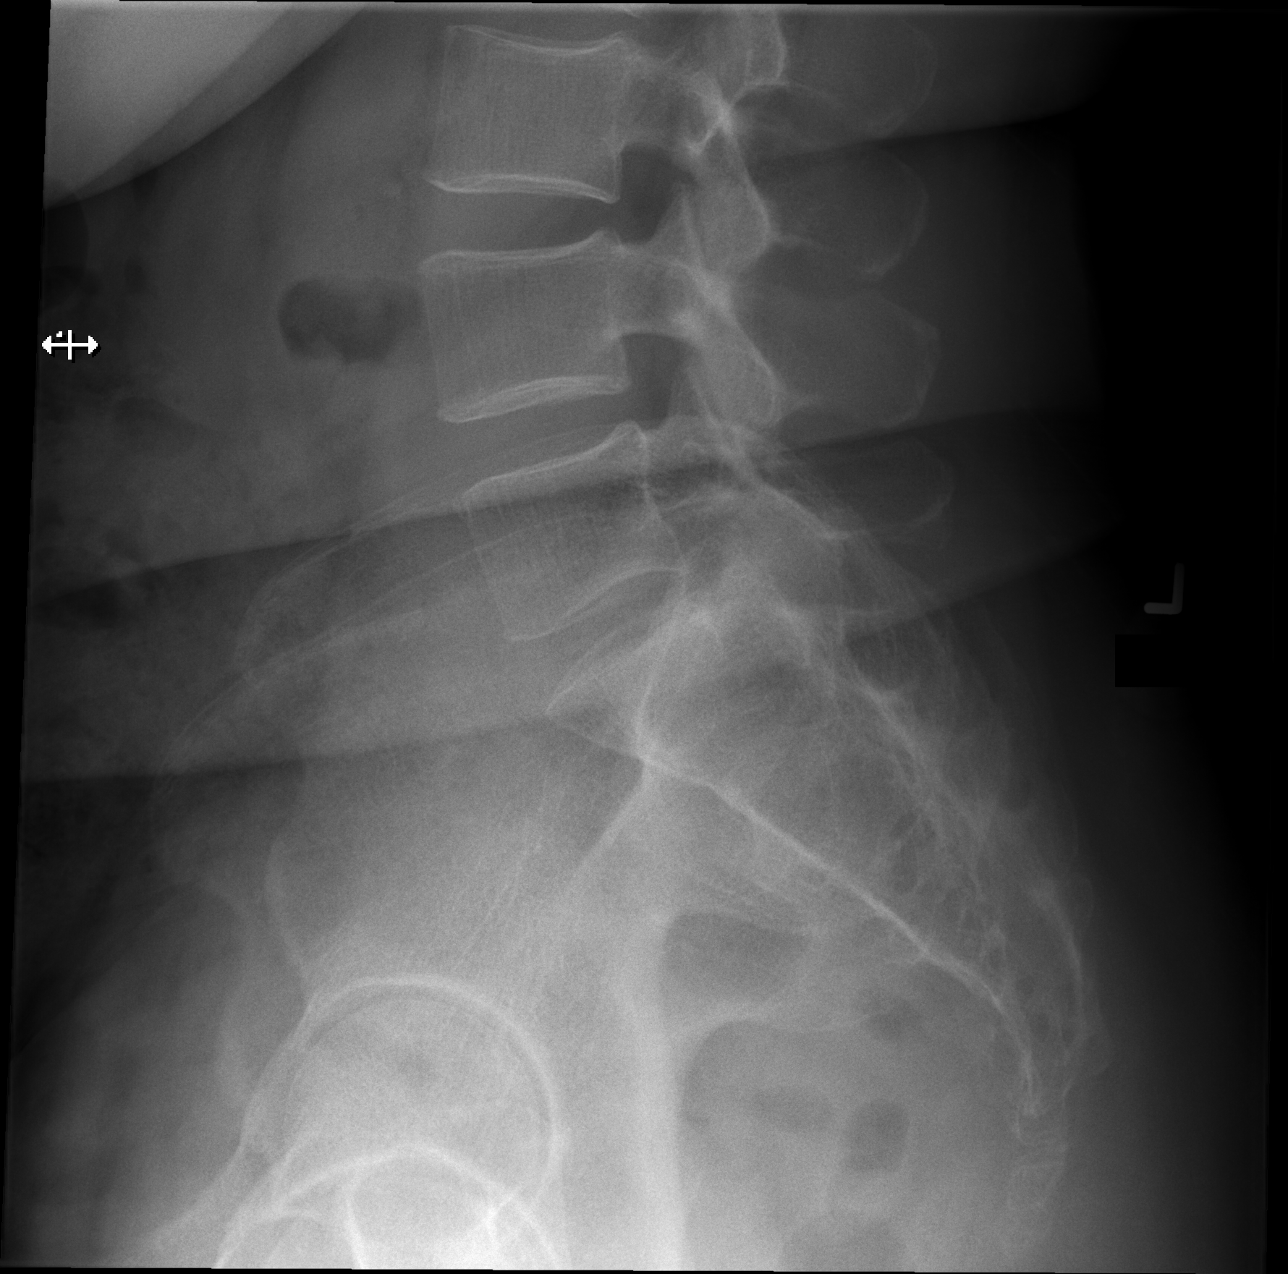

[5 of 5 positions shown; findings below may reference images not displayed]

FINDINGS: Five lumbar type vertebra. There is no acute fracture or subluxation
of the lumbar spine. The vertebral body heights are maintained. Mild
disc space narrowing at L1-L2 and L2-L3. The visualized posterior
elements appear intact. The soft tissues are unremarkable.
IMPRESSION: No acute/traumatic lumbar spine pathology.

## 2023-02-22 ENCOUNTER — Other Ambulatory Visit: Payer: Self-pay | Admitting: Family Medicine

## 2023-02-22 ENCOUNTER — Ambulatory Visit
Admission: RE | Admit: 2023-02-22 | Discharge: 2023-02-22 | Disposition: A | Discharge status: Home / Self Care | Payer: Medicare Other | Source: Ambulatory Visit | Attending: Family Medicine | Admitting: Family Medicine

## 2023-02-22 DIAGNOSIS — M24211 Disorder of ligament, right shoulder: Secondary | ICD-10-CM
